# Patient Record
Sex: Female | Born: 1950 | Race: Black or African American | Hispanic: No | Marital: Married | Smoking: Former smoker
Health system: Southern US, Community
[De-identification: ages and names within clinical notes are randomized; demographics above are authoritative.]

## PROBLEM LIST (undated history)

## (undated) DIAGNOSIS — M1990 Unspecified osteoarthritis, unspecified site: Secondary | ICD-10-CM

## (undated) DIAGNOSIS — I509 Heart failure, unspecified: Secondary | ICD-10-CM

## (undated) DIAGNOSIS — Z9289 Personal history of other medical treatment: Secondary | ICD-10-CM

## (undated) DIAGNOSIS — G4730 Sleep apnea, unspecified: Secondary | ICD-10-CM

## (undated) DIAGNOSIS — I10 Essential (primary) hypertension: Secondary | ICD-10-CM

## (undated) DIAGNOSIS — E119 Type 2 diabetes mellitus without complications: Secondary | ICD-10-CM

## (undated) DIAGNOSIS — R911 Solitary pulmonary nodule: Secondary | ICD-10-CM

## (undated) DIAGNOSIS — G40409 Other generalized epilepsy and epileptic syndromes, not intractable, without status epilepticus: Secondary | ICD-10-CM

## (undated) DIAGNOSIS — F329 Major depressive disorder, single episode, unspecified: Secondary | ICD-10-CM

## (undated) DIAGNOSIS — R7303 Prediabetes: Secondary | ICD-10-CM

## (undated) DIAGNOSIS — E7800 Pure hypercholesterolemia, unspecified: Secondary | ICD-10-CM

## (undated) DIAGNOSIS — Z95810 Presence of automatic (implantable) cardiac defibrillator: Secondary | ICD-10-CM

## (undated) DIAGNOSIS — G8929 Other chronic pain: Secondary | ICD-10-CM

## (undated) DIAGNOSIS — F32A Depression, unspecified: Secondary | ICD-10-CM

## (undated) DIAGNOSIS — R519 Headache, unspecified: Secondary | ICD-10-CM

## (undated) DIAGNOSIS — J45909 Unspecified asthma, uncomplicated: Secondary | ICD-10-CM

## (undated) DIAGNOSIS — M109 Gout, unspecified: Secondary | ICD-10-CM

## (undated) DIAGNOSIS — F419 Anxiety disorder, unspecified: Secondary | ICD-10-CM

## (undated) DIAGNOSIS — J42 Unspecified chronic bronchitis: Secondary | ICD-10-CM

## (undated) DIAGNOSIS — M542 Cervicalgia: Secondary | ICD-10-CM

## (undated) DIAGNOSIS — U071 COVID-19: Secondary | ICD-10-CM

## (undated) DIAGNOSIS — E041 Nontoxic single thyroid nodule: Secondary | ICD-10-CM

## (undated) DIAGNOSIS — R011 Cardiac murmur, unspecified: Secondary | ICD-10-CM

## (undated) DIAGNOSIS — I639 Cerebral infarction, unspecified: Secondary | ICD-10-CM

## (undated) DIAGNOSIS — G459 Transient cerebral ischemic attack, unspecified: Secondary | ICD-10-CM

## (undated) HISTORY — PX: NEUROPLASTY / TRANSPOSITION MEDIAN NERVE AT CARPAL TUNNEL: SUR893

## (undated) HISTORY — PX: INNER EAR SURGERY: SHX679

## (undated) HISTORY — PX: RIGHT OOPHORECTOMY: SHX2359

## (undated) HISTORY — DX: Solitary pulmonary nodule: R91.1

## (undated) HISTORY — PX: TEAR DUCT PROBING: SHX793

## (undated) HISTORY — PX: BACK SURGERY: SHX140

## (undated) HISTORY — PX: LAPAROSCOPIC CHOLECYSTECTOMY: SUR755

---

## 1987-08-16 HISTORY — PX: VAGINAL HYSTERECTOMY: SUR661

## 2003-08-16 HISTORY — PX: POSTERIOR FUSION LUMBAR SPINE: SUR632

## 2007-08-16 ENCOUNTER — Emergency Department (HOSPITAL_COMMUNITY): Admission: EM | Admit: 2007-08-16 | Discharge: 2007-08-16 | Payer: Self-pay | Admitting: Emergency Medicine

## 2008-05-15 DIAGNOSIS — I639 Cerebral infarction, unspecified: Secondary | ICD-10-CM

## 2008-05-15 HISTORY — DX: Cerebral infarction, unspecified: I63.9

## 2009-07-17 ENCOUNTER — Encounter: Admission: RE | Admit: 2009-07-17 | Discharge: 2009-07-17 | Payer: Self-pay | Admitting: Family Medicine

## 2009-08-18 ENCOUNTER — Encounter: Admission: RE | Admit: 2009-08-18 | Discharge: 2009-09-22 | Payer: Self-pay | Admitting: Family Medicine

## 2009-09-24 ENCOUNTER — Encounter: Admission: RE | Admit: 2009-09-24 | Discharge: 2009-09-24 | Payer: Self-pay | Admitting: Orthopedic Surgery

## 2009-12-31 ENCOUNTER — Ambulatory Visit (HOSPITAL_BASED_OUTPATIENT_CLINIC_OR_DEPARTMENT_OTHER): Admission: RE | Admit: 2009-12-31 | Discharge: 2009-12-31 | Payer: Self-pay | Admitting: Orthopedic Surgery

## 2010-07-02 ENCOUNTER — Encounter: Admission: RE | Admit: 2010-07-02 | Discharge: 2010-07-02 | Payer: Self-pay | Admitting: Family Medicine

## 2010-11-01 LAB — DIFFERENTIAL
Basophils Absolute: 0 10*3/uL (ref 0.0–0.1)
Basophils Relative: 0 % (ref 0–1)
Lymphocytes Relative: 35 % (ref 12–46)
Monocytes Absolute: 0.3 10*3/uL (ref 0.1–1.0)
Monocytes Relative: 7 % (ref 3–12)
Neutro Abs: 2.8 10*3/uL (ref 1.7–7.7)
Neutrophils Relative %: 56 % (ref 43–77)

## 2010-11-01 LAB — CBC
HCT: 39.5 % (ref 36.0–46.0)
Hemoglobin: 13.3 g/dL (ref 12.0–15.0)
MCHC: 33.6 g/dL (ref 30.0–36.0)
MCV: 93.6 fL (ref 78.0–100.0)
Platelets: 301 10*3/uL (ref 150–400)
RDW: 13.5 % (ref 11.5–15.5)

## 2010-11-01 LAB — COMPREHENSIVE METABOLIC PANEL
Albumin: 4.3 g/dL (ref 3.5–5.2)
BUN: 11 mg/dL (ref 6–23)
Creatinine, Ser: 0.81 mg/dL (ref 0.4–1.2)
Glucose, Bld: 102 mg/dL — ABNORMAL HIGH (ref 70–99)
Total Protein: 7.3 g/dL (ref 6.0–8.3)

## 2012-05-07 ENCOUNTER — Emergency Department (HOSPITAL_COMMUNITY): Payer: BC Managed Care – PPO

## 2012-05-07 ENCOUNTER — Emergency Department (HOSPITAL_COMMUNITY)
Admission: EM | Admit: 2012-05-07 | Discharge: 2012-05-07 | Disposition: A | Discharge status: Home / Self Care | Payer: BC Managed Care – PPO | Attending: Emergency Medicine | Admitting: Emergency Medicine

## 2012-05-07 ENCOUNTER — Encounter (HOSPITAL_COMMUNITY): Payer: Self-pay | Admitting: Family Medicine

## 2012-05-07 DIAGNOSIS — Z8673 Personal history of transient ischemic attack (TIA), and cerebral infarction without residual deficits: Secondary | ICD-10-CM | POA: Insufficient documentation

## 2012-05-07 DIAGNOSIS — R040 Epistaxis: Secondary | ICD-10-CM | POA: Insufficient documentation

## 2012-05-07 DIAGNOSIS — R51 Headache: Secondary | ICD-10-CM | POA: Insufficient documentation

## 2012-05-07 DIAGNOSIS — R269 Unspecified abnormalities of gait and mobility: Secondary | ICD-10-CM | POA: Insufficient documentation

## 2012-05-07 DIAGNOSIS — R42 Dizziness and giddiness: Secondary | ICD-10-CM | POA: Insufficient documentation

## 2012-05-07 DIAGNOSIS — H53149 Visual discomfort, unspecified: Secondary | ICD-10-CM | POA: Insufficient documentation

## 2012-05-07 DIAGNOSIS — Z79899 Other long term (current) drug therapy: Secondary | ICD-10-CM | POA: Insufficient documentation

## 2012-05-07 HISTORY — DX: Transient cerebral ischemic attack, unspecified: G45.9

## 2012-05-07 HISTORY — DX: Cerebral infarction, unspecified: I63.9

## 2012-05-07 LAB — URINALYSIS, ROUTINE W REFLEX MICROSCOPIC
Bilirubin Urine: NEGATIVE
Glucose, UA: NEGATIVE mg/dL
Hgb urine dipstick: NEGATIVE
Ketones, ur: NEGATIVE mg/dL
Nitrite: NEGATIVE
Protein, ur: NEGATIVE mg/dL
Specific Gravity, Urine: 1.016 (ref 1.005–1.030)
Urobilinogen, UA: 1 mg/dL (ref 0.0–1.0)
pH: 6 (ref 5.0–8.0)

## 2012-05-07 LAB — CBC WITH DIFFERENTIAL/PLATELET
Basophils Absolute: 0 K/uL (ref 0.0–0.1)
Basophils Relative: 0 % (ref 0–1)
Eosinophils Absolute: 0.1 K/uL (ref 0.0–0.7)
Eosinophils Relative: 1 % (ref 0–5)
HCT: 41.5 % (ref 36.0–46.0)
Hemoglobin: 14.1 g/dL (ref 12.0–15.0)
Lymphocytes Relative: 40 % (ref 12–46)
Lymphs Abs: 2.4 K/uL (ref 0.7–4.0)
MCH: 32 pg (ref 26.0–34.0)
MCHC: 34 g/dL (ref 30.0–36.0)
MCV: 94.1 fL (ref 78.0–100.0)
Monocytes Absolute: 0.3 K/uL (ref 0.1–1.0)
Monocytes Relative: 6 % (ref 3–12)
Neutro Abs: 3.2 K/uL (ref 1.7–7.7)
Neutrophils Relative %: 54 % (ref 43–77)
Platelets: 290 K/uL (ref 150–400)
RBC: 4.41 MIL/uL (ref 3.87–5.11)
RDW: 13.3 % (ref 11.5–15.5)
WBC: 6 K/uL (ref 4.0–10.5)

## 2012-05-07 LAB — GLUCOSE, CAPILLARY: Glucose-Capillary: 84 mg/dL (ref 70–99)

## 2012-05-07 LAB — URINE MICROSCOPIC-ADD ON

## 2012-05-07 LAB — BASIC METABOLIC PANEL
BUN: 10 mg/dL (ref 6–23)
Chloride: 104 mEq/L (ref 96–112)
Creatinine, Ser: 0.73 mg/dL (ref 0.50–1.10)
Glucose, Bld: 98 mg/dL (ref 70–99)
Potassium: 4.2 mEq/L (ref 3.5–5.1)

## 2012-05-07 MED ORDER — METOCLOPRAMIDE HCL 5 MG/ML IJ SOLN
10.0000 mg | Freq: Once | INTRAMUSCULAR | Status: AC
  Administered 2012-05-07: 10 mg via INTRAVENOUS
  Filled 2012-05-07: qty 2

## 2012-05-07 MED ORDER — FENTANYL CITRATE 0.05 MG/ML IJ SOLN
25.0000 ug | Freq: Once | INTRAMUSCULAR | Status: AC
  Administered 2012-05-07: 25 ug via INTRAVENOUS
  Filled 2012-05-07: qty 2

## 2012-05-07 MED ORDER — MECLIZINE HCL 25 MG PO TABS
25.0000 mg | ORAL_TABLET | Freq: Once | ORAL | Status: AC
  Administered 2012-05-07: 25 mg via ORAL
  Filled 2012-05-07: qty 1

## 2012-05-07 MED ORDER — DEXAMETHASONE SODIUM PHOSPHATE 10 MG/ML IJ SOLN
10.0000 mg | Freq: Once | INTRAMUSCULAR | Status: AC
  Administered 2012-05-07: 10 mg via INTRAVENOUS
  Filled 2012-05-07: qty 1

## 2012-05-07 MED ORDER — DIPHENHYDRAMINE HCL 50 MG/ML IJ SOLN
12.5000 mg | Freq: Once | INTRAMUSCULAR | Status: AC
  Administered 2012-05-07: 12.5 mg via INTRAVENOUS
  Filled 2012-05-07: qty 1

## 2012-05-07 NOTE — ED Provider Notes (Signed)
History     CSN: [id number]  Arrival date & time 05/07/12  1159   First MD Initiated Contact with Patient 05/07/12 1508      Chief Complaint  Patient presents with  . Headache    (Consider location/radiation/quality/duration/timing/severity/associated sxs/prior treatment) HPI Comments: Margaret Vargas is a 61 y.o. Female who presents with complaint of intermittent headaches for last few weeks. Today states was working out at [employer] and was getting very dizzy. States felt lightheaded and felt like room was spinning every time she was doing something. States had presyncopal episode even while walking, but did not have LOC. States headaches have been intermittnet, mainly right sided and frontal. States hx of the same. States today's headache began when she was working out. States dizziness was associated with nausea. Denies fever, visual changes, vomiting, chest pain, SOB. States hx of TIAs. No cardiac hx. At present, states continues to have headache, however denies any current dizziness. Pt is having sensitivity to light.   The history is provided by the patient.    History reviewed. No pertinent past medical history.  History reviewed. No pertinent past surgical history.  History reviewed. No pertinent family history.  History  Substance Use Topics  . Smoking status: Never Smoker   . Smokeless tobacco: Not on file  . Alcohol Use: No    OB History    Grav Para Term Preterm Abortions TAB SAB Ect Mult Living                  Review of Systems  Constitutional: Negative for fever, chills and fatigue.  HENT: Negative for neck pain and neck stiffness.   Eyes: Positive for photophobia. Negative for pain and visual disturbance.  Respiratory: Negative for chest tightness and shortness of breath.   Cardiovascular: Negative for chest pain and leg swelling.  Gastrointestinal: Negative.   Genitourinary: Negative for dysuria, urgency and frequency.  Musculoskeletal: Positive for gait  problem.  Skin: Negative.   Neurological: Positive for dizziness, light-headedness and headaches. Negative for speech difficulty, weakness and numbness.  Psychiatric/Behavioral: Negative.     Allergies  Morphine and related  Home Medications   Current Outpatient Rx  Name Route Sig Dispense Refill  . ALLOPURINOL 100 MG PO TABS Oral Take 100 mg by mouth daily.    . ASPIRIN EC 81 MG PO TBEC Oral Take 81 mg by mouth daily.    [name] Kitchen CANDESARTAN CILEXETIL 32 MG PO TABS Oral Take 32 mg by mouth daily.    [name] Kitchen CETIRIZINE HCL 10 MG PO TABS Oral Take 10 mg by mouth daily.    [name] Kitchen VITAMIN D 1000 UNITS PO TABS Oral Take 1,000 Units by mouth daily.    [name] Kitchen VITAMIN B 12 PO Oral Take 1 tablet by mouth daily.    [name] Kitchen ESCITALOPRAM OXALATE 20 MG PO TABS Oral Take 20 mg by mouth daily.    [name] Kitchen HYDROCHLOROTHIAZIDE 25 MG PO TABS Oral Take 25 mg by mouth daily.    [name] Kitchen METFORMIN HCL 500 MG PO TABS Oral Take 500 mg by mouth daily.    [name] Kitchen SIMVASTATIN 20 MG PO TABS Oral Take 20 mg by mouth every evening.      BP 128/71  Pulse 58  Temp 98.1 F (36.7 C) (Oral)  Resp 17  SpO2 98%  Physical Exam  Nursing note and vitals reviewed. Constitutional: She is oriented to person, place, and time. She appears well-developed and well-nourished. No distress.  HENT:  Head: Normocephalic and atraumatic.  Right  Ear: External ear normal.  Left Ear: External ear normal.  Nose: Nose normal.  Mouth/Throat: Oropharynx is clear and moist.  Eyes: Conjunctivae normal and EOM are normal. Pupils are equal, round, and reactive to light.  Neck: Normal range of motion. Neck supple.  Cardiovascular: Normal rate, regular rhythm and normal heart sounds.   Pulmonary/Chest: Effort normal and breath sounds normal. No respiratory distress. She has no wheezes. She has no rales.  Abdominal: Soft. Bowel sounds are normal. She exhibits no distension. There is no tenderness. There is no rebound.  Musculoskeletal: She exhibits no edema.  Neurological: She is  alert and oriented to person, place, and time. No cranial nerve deficit. Coordination abnormal.       Slight pronator drift on the left side, left grip appears slightly weaker than right. There is also some weakness with left foot plantarflexion compared to the right. Normal sensation. Past pointing on the left side with finger to nose  Skin: Skin is warm and dry.    ED Course  Procedures (including critical care time)  Pt with left sided deficits on exam, presyncopal episodes today. Will get labs, ECG, CT head, monitor.   Results for orders placed during the hospital encounter of 05/07/12  CBC WITH DIFFERENTIAL      Component Value Range   WBC 6.0  4.0 - 10.5 K/uL   RBC 4.41  3.87 - 5.11 MIL/uL   Hemoglobin 14.1  12.0 - 15.0 g/dL   HCT 40.9  81.1 - 91.4 %   MCV 94.1  78.0 - 100.0 fL   MCH 32.0  26.0 - 34.0 pg   MCHC 34.0  30.0 - 36.0 g/dL   RDW 78.2  95.6 - 21.3 %   Platelets 290  150 - 400 K/uL   Neutrophils Relative 54  43 - 77 %   Neutro Abs 3.2  1.7 - 7.7 K/uL   Lymphocytes Relative 40  12 - 46 %   Lymphs Abs 2.4  0.7 - 4.0 K/uL   Monocytes Relative 6  3 - 12 %   Monocytes Absolute 0.3  0.1 - 1.0 K/uL   Eosinophils Relative 1  0 - 5 %   Eosinophils Absolute 0.1  0.0 - 0.7 K/uL   Basophils Relative 0  0 - 1 %   Basophils Absolute 0.0  0.0 - 0.1 K/uL  BASIC METABOLIC PANEL      Component Value Range   Sodium 142  135 - 145 mEq/L   Potassium 4.2  3.5 - 5.1 mEq/L   Chloride 104  96 - 112 mEq/L   CO2 27  19 - 32 mEq/L   Glucose, Bld 98  70 - 99 mg/dL   BUN 10  6 - 23 mg/dL   Creatinine, Ser 0.86  0.50 - 1.10 mg/dL   Calcium 57.8  8.4 - 46.9 mg/dL   GFR calc non Af Amer >90  >90 mL/min   GFR calc Af Amer >90  >90 mL/min  URINALYSIS, ROUTINE W REFLEX MICROSCOPIC      Component Value Range   Color, Urine YELLOW  YELLOW   APPearance HAZY (*) CLEAR   Specific Gravity, Urine 1.016  1.005 - 1.030   pH 6.0  5.0 - 8.0   Glucose, UA NEGATIVE  NEGATIVE mg/dL   Hgb urine  dipstick NEGATIVE  NEGATIVE   Bilirubin Urine NEGATIVE  NEGATIVE   Ketones, ur NEGATIVE  NEGATIVE mg/dL   Protein, ur NEGATIVE  NEGATIVE mg/dL   Urobilinogen,  UA 1.0  0.0 - 1.0 mg/dL   Nitrite NEGATIVE  NEGATIVE   Leukocytes, UA MODERATE (*) NEGATIVE  URINE MICROSCOPIC-ADD ON      Component Value Range   Squamous Epithelial / LPF FEW (*) RARE   WBC, UA 3-6  <3 WBC/hpf   Bacteria, UA RARE  RARE  GLUCOSE, CAPILLARY      Component Value Range   Glucose-Capillary 84  70 - 99 mg/dL   Comment 1 Documented in Chart     Comment 2 Notify RN     Ct Head Wo Contrast  05/07/2012  *RADIOLOGY REPORT*  Clinical Data: Headaches, epistaxis, dizziness, nausea, left side weakness for a week  CT HEAD WITHOUT CONTRAST  Technique:  Contiguous axial images were obtained from the base of the skull through the vertex without contrast.  Comparison: None  Findings: Numerous artifacts at skull base secondary to beam hardening. Normal ventricular morphology. No midline shift or mass effect. Single focus of high attenuation is identified at the right frontal region image 12 On thin section bone window images, this appears to represent volume averaging with minimal hyperostosis frontalis interna at the right frontal bone. No intracranial hemorrhage, mass lesion or evidence of acute infarction. Visualized paranasal sinuses and mastoid air cells clear. No acute calvarial abnormalities.  IMPRESSION: No acute intracranial abnormalities.   Original Report Authenticated By: Lollie Marrow, M.D.     Date: 05/07/2012  Rate: 64  Rhythm: normal sinus rhythm  QRS Axis: normal  Intervals: normal  ST/T Wave abnormalities: normal  Conduction Disutrbances:none  Narrative Interpretation: artifact noted  Old EKG Reviewed: unchanged    6:20 PM Pt's headache improving with decadron, benadryl, reglan. Her left sided weakness apparently is a residual from a stroke she had in 2006? Per husband which they did not mention earlier. She  has no new neurodeficits. She is no longer dizzy. She is ambulating without problems. She was hydrated in ER. She is requesting to go home. Discussed with Dr. Fonnie Jarvis who saw pt, agrees with plan to d/c home with close follow up with pcp.   No diagnosis found.    MDM  Pt with dizziness while working out at the gym earlier today. No new neurodeficits. Dizziness resolved. She is not having CP, SOB. Headache is improving. Hx of similar headaches, this one gradual in onset, unilateral. VS normal. Pt in NAD, non toxic. No concern for ACS or an acute stroke at this time. Will d/c home with close follow  Up with pcp         Lottie Mussel, PA 05/07/12 1824  Lottie Mussel, PA 05/08/12 0110

## 2012-05-07 NOTE — ED Notes (Signed)
Per pt she has had headaches for weeks. sts she also had a nosebleed on MOnday. sts she was at the gym today and became dizzy and felt like the room was spinning, sts also severe HA and nausea. Pt sts she has noticed her left side was weaker for about a week now. No other deficits noted. Pt tearful

## 2012-05-07 NOTE — ED Provider Notes (Signed)
Medical screening examination/treatment/procedure(s) were conducted as a shared visit with non-physician practitioner(s) and myself.  I personally evaluated the patient during the encounter  The patient and her husband state patient has had some very mild left facial droop and left arm and leg weakness since a prior stroke, today she had an episode of presyncope with lightheadedness with nausea without vertigo and without any change in speech vision swallowing or understanding and no new localized or lateralizing weakness or numbness or incoordination. She is gradual onset headache today that was not sudden onset. She's had similar gradual onset headaches off-and-on gradually lasting several hours at a time for the last couple weeks. Today's presyncope was not associated with vertigo or incoordination or any new focal neurologic symptoms. On examination her pupils reactive, extraocular movements intact, no nystagmus, peripheral visual fields intact to confrontation, slight facial asymmetry with slight left lower facial weakness which has been states apparently is baseline for patient, she also is very slight left hemiparesis and arm and leg which is also baseline for the patient with minimal left pronator drift with normal light touch in both arms and both legs normal finger to nose testing of the right arm and minimal difficulty with her left arm which apparently is baseline for her as well.  Hurman Horn, MD 05/08/12 [phone]

## 2012-05-07 NOTE — ED Notes (Signed)
MD at bedside. 

## 2012-05-09 LAB — POCT I-STAT TROPONIN I

## 2013-12-31 ENCOUNTER — Other Ambulatory Visit: Payer: Self-pay

## 2013-12-31 DIAGNOSIS — Z1231 Encounter for screening mammogram for malignant neoplasm of breast: Secondary | ICD-10-CM

## 2014-01-10 ENCOUNTER — Ambulatory Visit (INDEPENDENT_AMBULATORY_CARE_PROVIDER_SITE_OTHER): Payer: BC Managed Care – PPO

## 2014-01-10 VITALS — Ht 69.5 in | Wt 300.0 lb

## 2014-01-10 DIAGNOSIS — M722 Plantar fascial fibromatosis: Secondary | ICD-10-CM

## 2014-01-10 DIAGNOSIS — M7730 Calcaneal spur, unspecified foot: Secondary | ICD-10-CM

## 2014-01-10 DIAGNOSIS — M79609 Pain in unspecified limb: Secondary | ICD-10-CM

## 2014-01-10 MED ORDER — MELOXICAM 15 MG PO TABS: 15.0000 mg | ORAL_TABLET | Freq: Every day | ORAL | Status: DC

## 2014-01-10 NOTE — Patient Instructions (Signed)

## 2014-01-10 NOTE — Progress Notes (Signed)
   Subjective:    Patient ID: Margaret Vargas, female    DOB: 1950/10/29, 63 y.o.   MRN: [id number]  HPI Comments: N heel pain L left heel  D 2 weeks O sudden C dull or pressure pain A worse after rest and long periods of activity T ice massage, Advil     Review of Systems  HENT: Positive for hearing loss and sinus pressure.   Musculoskeletal: Positive for back pain.  Allergic/Immunologic: Positive for environmental allergies.  Neurological: Positive for seizures.       Seizures - 2004  All other systems reviewed and are negative.      Objective:   Physical Exam Will change at the findings as follows vascular status is intact pedal pulses palpable DP and PT posterior were for capillary fill time 3 seconds epicritic and proprioceptive sensations intact and symmetric bilateral there is normal plantar response DTRs not listed dermatologically skin color pigment normal hair growth diminished absent nails somewhat criptotic otherwise unremarkable there is pain on palpation mid band of the plantar fascia from medial arch only to the inferior heel area on the left there is some tenderness in the right heel as well although not as severe intense than left has been severely painful for about 2 weeks now especially on first up in the morning or getting up after a period of rest her activities in inactivity. Orthopedic exam unremarkable x-rays reveal well-developed inferior and retrocalcaneal spurring no signs of fracture or other osseous abnormalities rectus foot type otherwise identified.       Assessment & Plan:  Assessment plantar fasciitis/heel spur syndrome left foot plan at this time fascial strapping applied there is some concern about adhesive allergy patient is allergic to the plastic Band-Aids I had problems with cloth and paper tape however we'll discontinue taping or take it off there's any skin irritation from the fascial strapping. Maintain strapping for 5 days as instructed also  this time prescription for MOBIC 15 mg once daily is issued recommended ice to the heel every evening and recheck in 2-3 weeks for followup and reevaluation may be candidate for orthoses in the future based on progress  Alvan Dame DPM

## 2014-01-14 ENCOUNTER — Ambulatory Visit
Admission: RE | Admit: 2014-01-14 | Discharge: 2014-01-14 | Disposition: A | Discharge status: Home / Self Care | Payer: BC Managed Care – PPO | Source: Ambulatory Visit

## 2014-01-14 DIAGNOSIS — Z1231 Encounter for screening mammogram for malignant neoplasm of breast: Secondary | ICD-10-CM

## 2014-01-24 ENCOUNTER — Ambulatory Visit: Payer: BC Managed Care – PPO

## 2014-05-05 ENCOUNTER — Other Ambulatory Visit: Payer: Self-pay | Admitting: Neurology

## 2014-05-05 DIAGNOSIS — G4489 Other headache syndrome: Secondary | ICD-10-CM

## 2014-07-21 ENCOUNTER — Other Ambulatory Visit: Payer: Self-pay | Admitting: Family Medicine

## 2014-07-21 ENCOUNTER — Ambulatory Visit
Admission: RE | Admit: 2014-07-21 | Discharge: 2014-07-21 | Disposition: A | Discharge status: Home / Self Care | Payer: BC Managed Care – PPO | Source: Ambulatory Visit | Attending: Family Medicine | Admitting: Family Medicine

## 2014-07-21 DIAGNOSIS — M542 Cervicalgia: Secondary | ICD-10-CM

## 2014-07-24 ENCOUNTER — Other Ambulatory Visit: Payer: Self-pay | Admitting: Family Medicine

## 2014-07-24 DIAGNOSIS — K119 Disease of salivary gland, unspecified: Secondary | ICD-10-CM

## 2014-07-24 DIAGNOSIS — R42 Dizziness and giddiness: Secondary | ICD-10-CM

## 2014-07-24 DIAGNOSIS — K118 Other diseases of salivary glands: Secondary | ICD-10-CM

## 2014-07-29 ENCOUNTER — Other Ambulatory Visit: Payer: BC Managed Care – PPO

## 2014-07-31 ENCOUNTER — Ambulatory Visit
Admission: RE | Admit: 2014-07-31 | Discharge: 2014-07-31 | Disposition: A | Discharge status: Home / Self Care | Payer: BC Managed Care – PPO | Source: Ambulatory Visit | Attending: Family Medicine | Admitting: Family Medicine

## 2014-07-31 DIAGNOSIS — R42 Dizziness and giddiness: Secondary | ICD-10-CM

## 2014-08-04 ENCOUNTER — Ambulatory Visit
Admission: RE | Admit: 2014-08-04 | Discharge: 2014-08-04 | Disposition: A | Discharge status: Home / Self Care | Payer: BC Managed Care – PPO | Source: Ambulatory Visit | Attending: Family Medicine | Admitting: Family Medicine

## 2014-08-04 ENCOUNTER — Other Ambulatory Visit: Payer: BC Managed Care – PPO

## 2014-08-04 DIAGNOSIS — R42 Dizziness and giddiness: Secondary | ICD-10-CM

## 2014-08-04 DIAGNOSIS — K118 Other diseases of salivary glands: Secondary | ICD-10-CM

## 2014-08-04 DIAGNOSIS — K119 Disease of salivary gland, unspecified: Secondary | ICD-10-CM

## 2014-08-05 ENCOUNTER — Other Ambulatory Visit: Payer: Self-pay | Admitting: Family Medicine

## 2014-08-05 DIAGNOSIS — E041 Nontoxic single thyroid nodule: Secondary | ICD-10-CM

## 2014-08-06 ENCOUNTER — Other Ambulatory Visit (HOSPITAL_COMMUNITY): Payer: Self-pay | Admitting: Family Medicine

## 2014-08-06 ENCOUNTER — Other Ambulatory Visit: Payer: Self-pay | Admitting: Family Medicine

## 2014-08-06 DIAGNOSIS — E041 Nontoxic single thyroid nodule: Secondary | ICD-10-CM

## 2014-08-06 DIAGNOSIS — E042 Nontoxic multinodular goiter: Secondary | ICD-10-CM

## 2014-08-13 ENCOUNTER — Ambulatory Visit (HOSPITAL_COMMUNITY)
Admission: RE | Admit: 2014-08-13 | Discharge: 2014-08-13 | Disposition: A | Discharge status: Home / Self Care | Payer: BC Managed Care – PPO | Source: Ambulatory Visit | Attending: Family Medicine | Admitting: Family Medicine

## 2014-08-13 DIAGNOSIS — E041 Nontoxic single thyroid nodule: Secondary | ICD-10-CM | POA: Insufficient documentation

## 2014-08-13 MED ORDER — LIDOCAINE HCL (PF) 1 % IJ SOLN
INTRAMUSCULAR | Status: AC
  Filled 2014-08-13: qty 10

## 2014-08-13 NOTE — Procedures (Signed)
Successful [place] left thyroid nodule FNA No complications.  Brayton El PA-C Interventional Radiology 08/13/2014 2:46 PM

## 2014-08-26 ENCOUNTER — Other Ambulatory Visit: Payer: BC Managed Care – PPO

## 2014-08-27 ENCOUNTER — Other Ambulatory Visit: Payer: Self-pay | Admitting: Family Medicine

## 2014-08-27 ENCOUNTER — Ambulatory Visit
Admission: RE | Admit: 2014-08-27 | Discharge: 2014-08-27 | Disposition: A | Discharge status: Home / Self Care | Payer: BLUE CROSS/BLUE SHIELD | Source: Ambulatory Visit | Attending: Family Medicine | Admitting: Family Medicine

## 2014-08-27 DIAGNOSIS — J45909 Unspecified asthma, uncomplicated: Secondary | ICD-10-CM

## 2014-12-18 ENCOUNTER — Other Ambulatory Visit: Payer: Self-pay | Admitting: Surgery

## 2014-12-18 DIAGNOSIS — E041 Nontoxic single thyroid nodule: Secondary | ICD-10-CM

## 2014-12-23 ENCOUNTER — Other Ambulatory Visit: Payer: Self-pay | Admitting: Surgery

## 2014-12-23 DIAGNOSIS — E041 Nontoxic single thyroid nodule: Secondary | ICD-10-CM

## 2015-02-04 ENCOUNTER — Ambulatory Visit
Admission: RE | Admit: 2015-02-04 | Discharge: 2015-02-04 | Disposition: A | Discharge status: Home / Self Care | Payer: BLUE CROSS/BLUE SHIELD | Source: Ambulatory Visit | Attending: Surgery | Admitting: Surgery

## 2015-02-04 DIAGNOSIS — E041 Nontoxic single thyroid nodule: Secondary | ICD-10-CM

## 2015-06-07 IMAGING — US US BIOPSY
1 series · 8 of 8 positions shown · non-contrast
Comparison: Ultrasound soft tissue neck from 08/04/2014.

MEDICATIONS:
None

COMPLICATIONS:
None

INDICATION: Indeterminate left thyroid nodule

EXAM:
ULTRASOUND GUIDED FINE NEEDLE ASPIRATION OF INDETERMINATE LEFT
THYROID NODULE
TECHNIQUE: Informed written consent was obtained from the patient after a
discussion of the risks, benefits and alternatives to treatment.
Questions regarding the procedure were encouraged and answered. A
timeout was performed prior to the initiation of the procedure.

[Series 1: us biopsy · 0.07mm/px · 8 acquisitions, 8 frames shown]
[im 1/8]
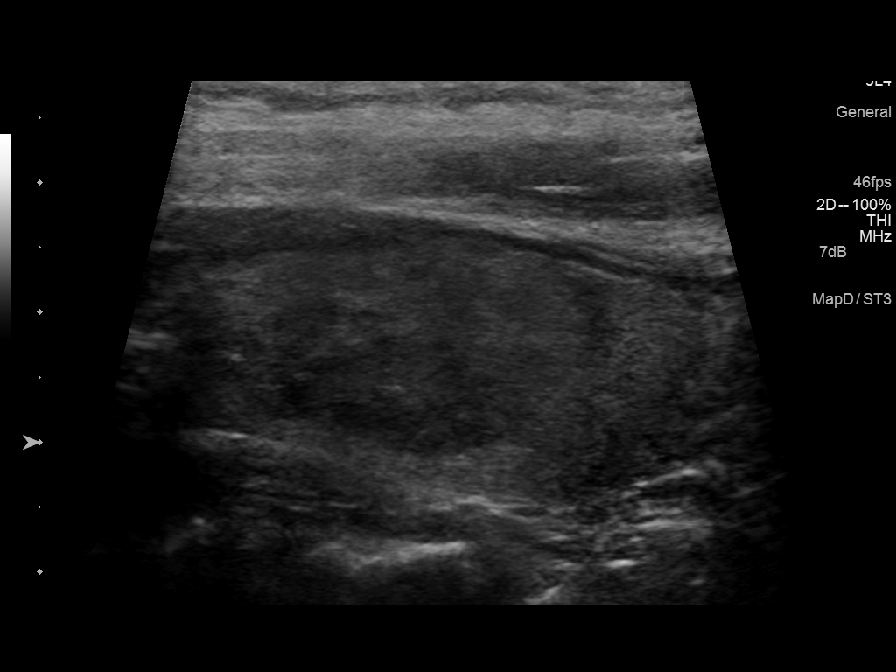
[im 2/8]
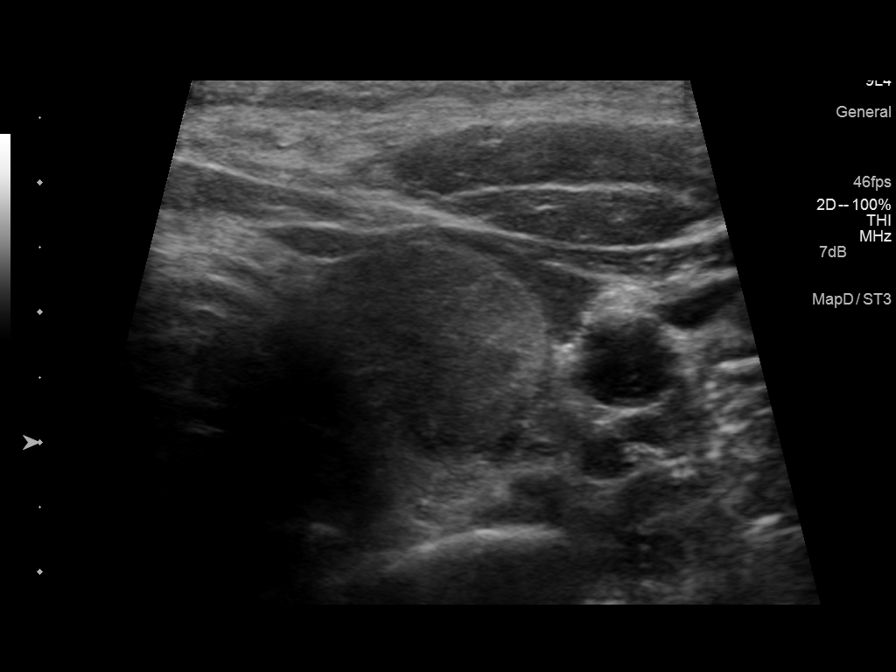
[im 3/8]
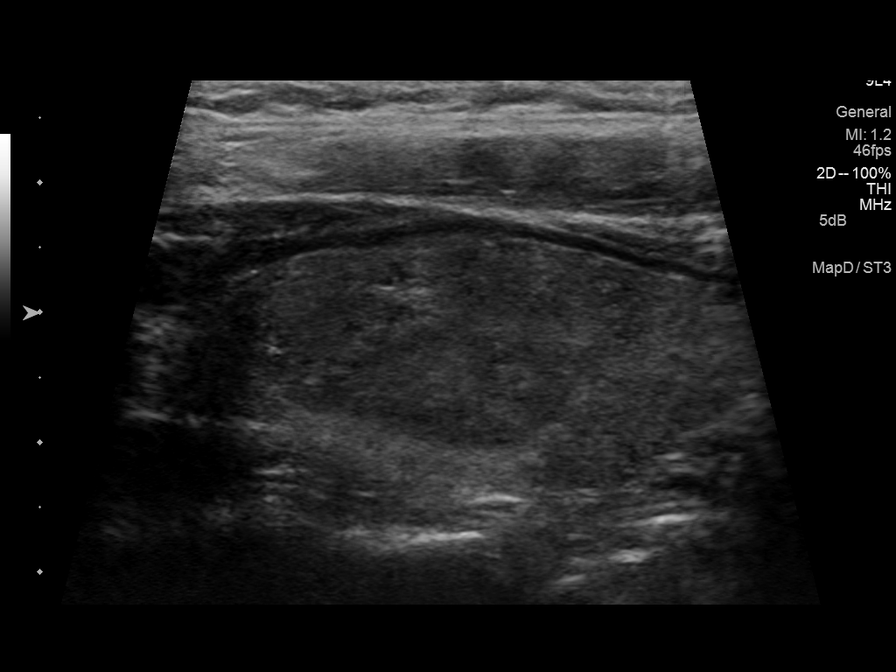
[im 4/8]
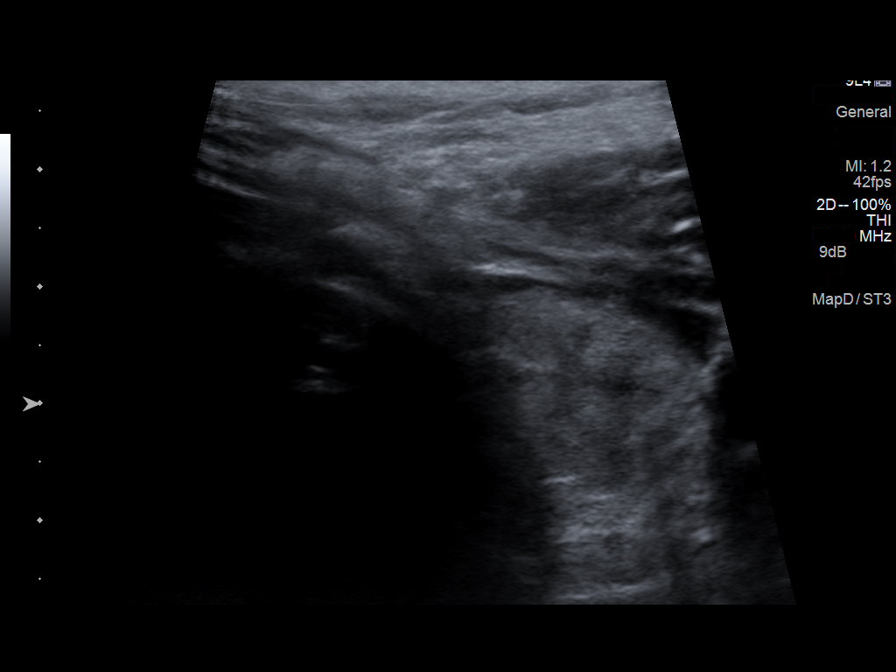
[im 5/8]
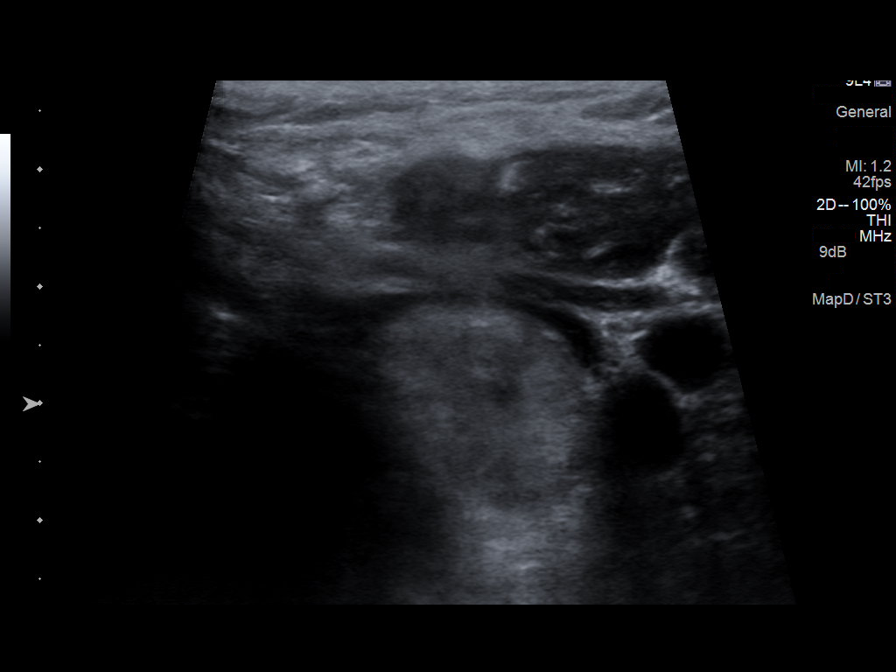
[im 6/8]
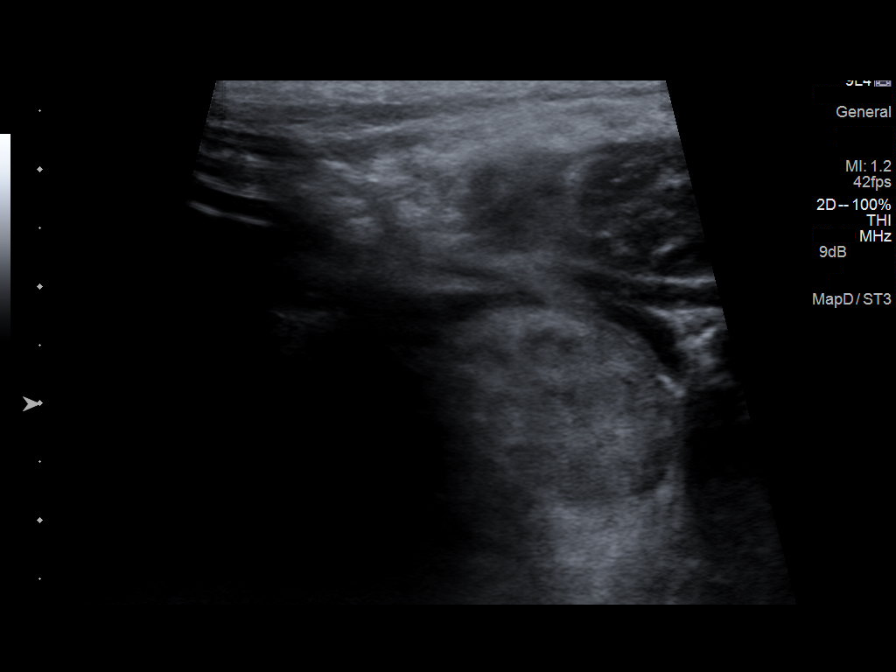
[im 7/8]
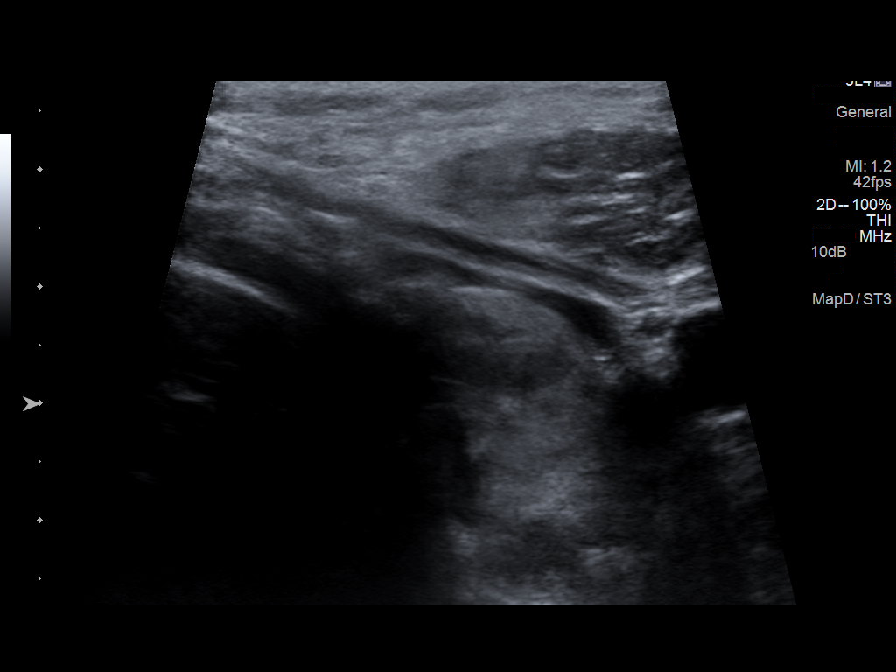
[im 8/8]
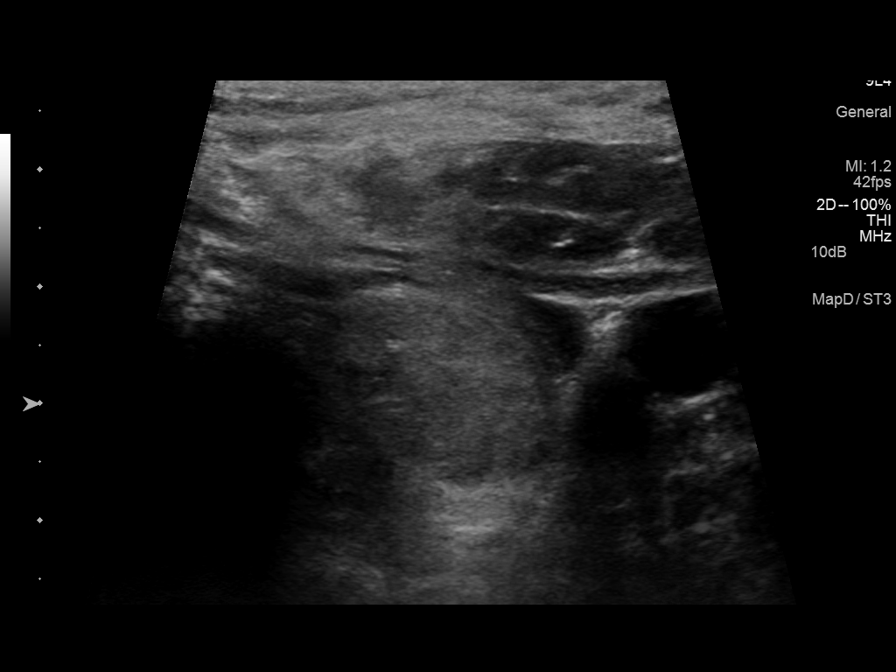

[8 of 8 positions shown; findings below may reference images not displayed]

Pre-procedural ultrasound scanning demonstrated dominant nodule of
the superior left thyroid lobe measuring approximately 3.5 x 1.4 x
2.3 cm. Solid, hypoechoic in appearance

The procedure was planned. The neck was prepped in the usual sterile
fashion, and a sterile drape was applied covering the operative
field. A timeout was performed prior to the initiation of the
procedure. Local anesthesia was provided with 1% lidocaine.

Under direct ultrasound guidance, 4 FNA biopsies were performed of
the left thyroid nodule with a 25 gauge needle. The samples were
prepared and submitted to pathology.

Limited post procedural scanning was negative for hematoma or
additional complication. Dressings were placed. The patient
tolerated the above procedures procedure well without immediate
postprocedural complication.
IMPRESSION: Technically successful ultrasound guided fine needle aspiration of
left thyroid nodule

## 2015-06-21 IMAGING — CR DG CHEST 2V
2 series · 2 of 2 positions shown · non-contrast
Comparison: None.

CLINICAL DATA: Cough shortness of breath and wheezing for the past
week with occasional fever ; history of diabetes and asthma

EXAM:
CHEST  2 VIEW

[w chest pa *]
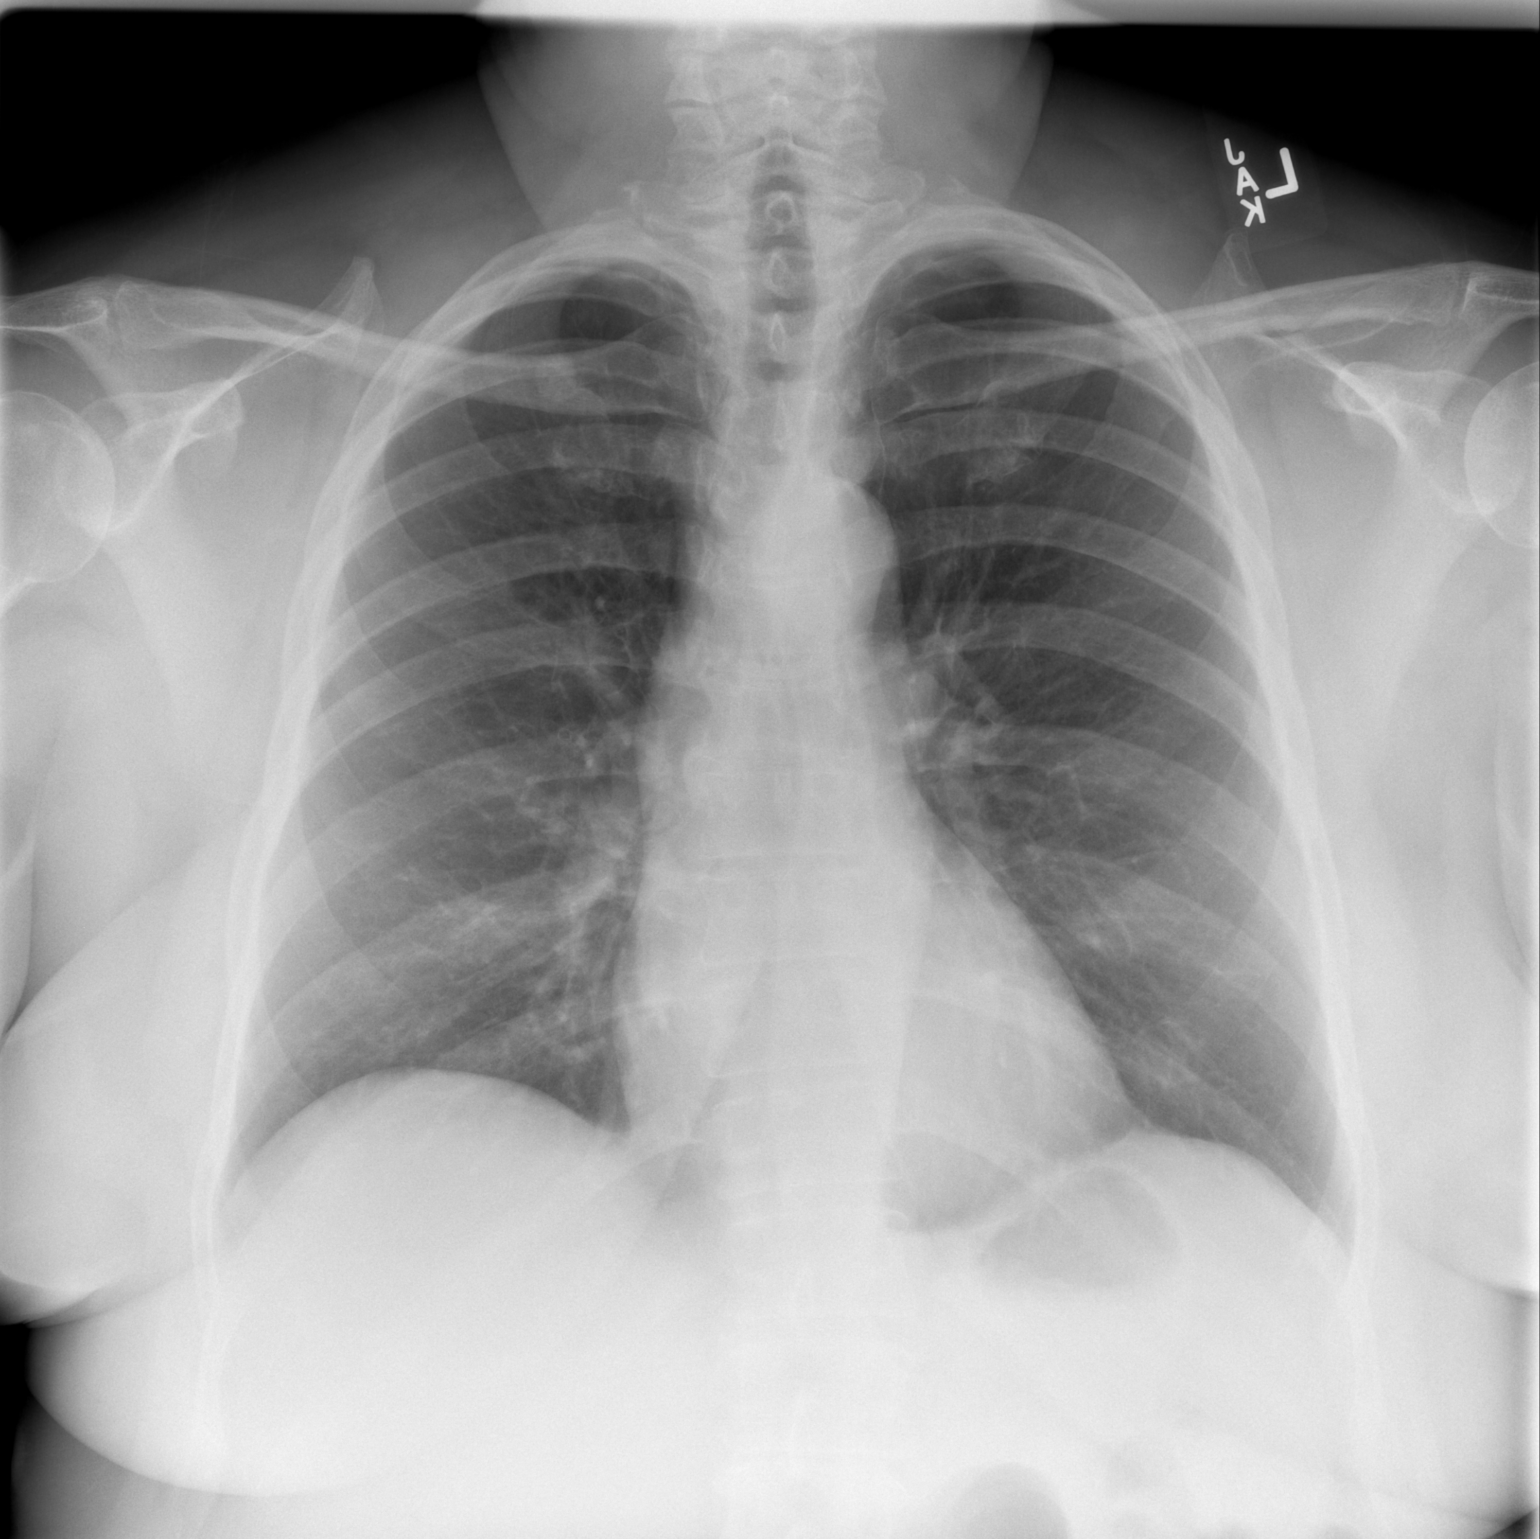

[w chest lat]
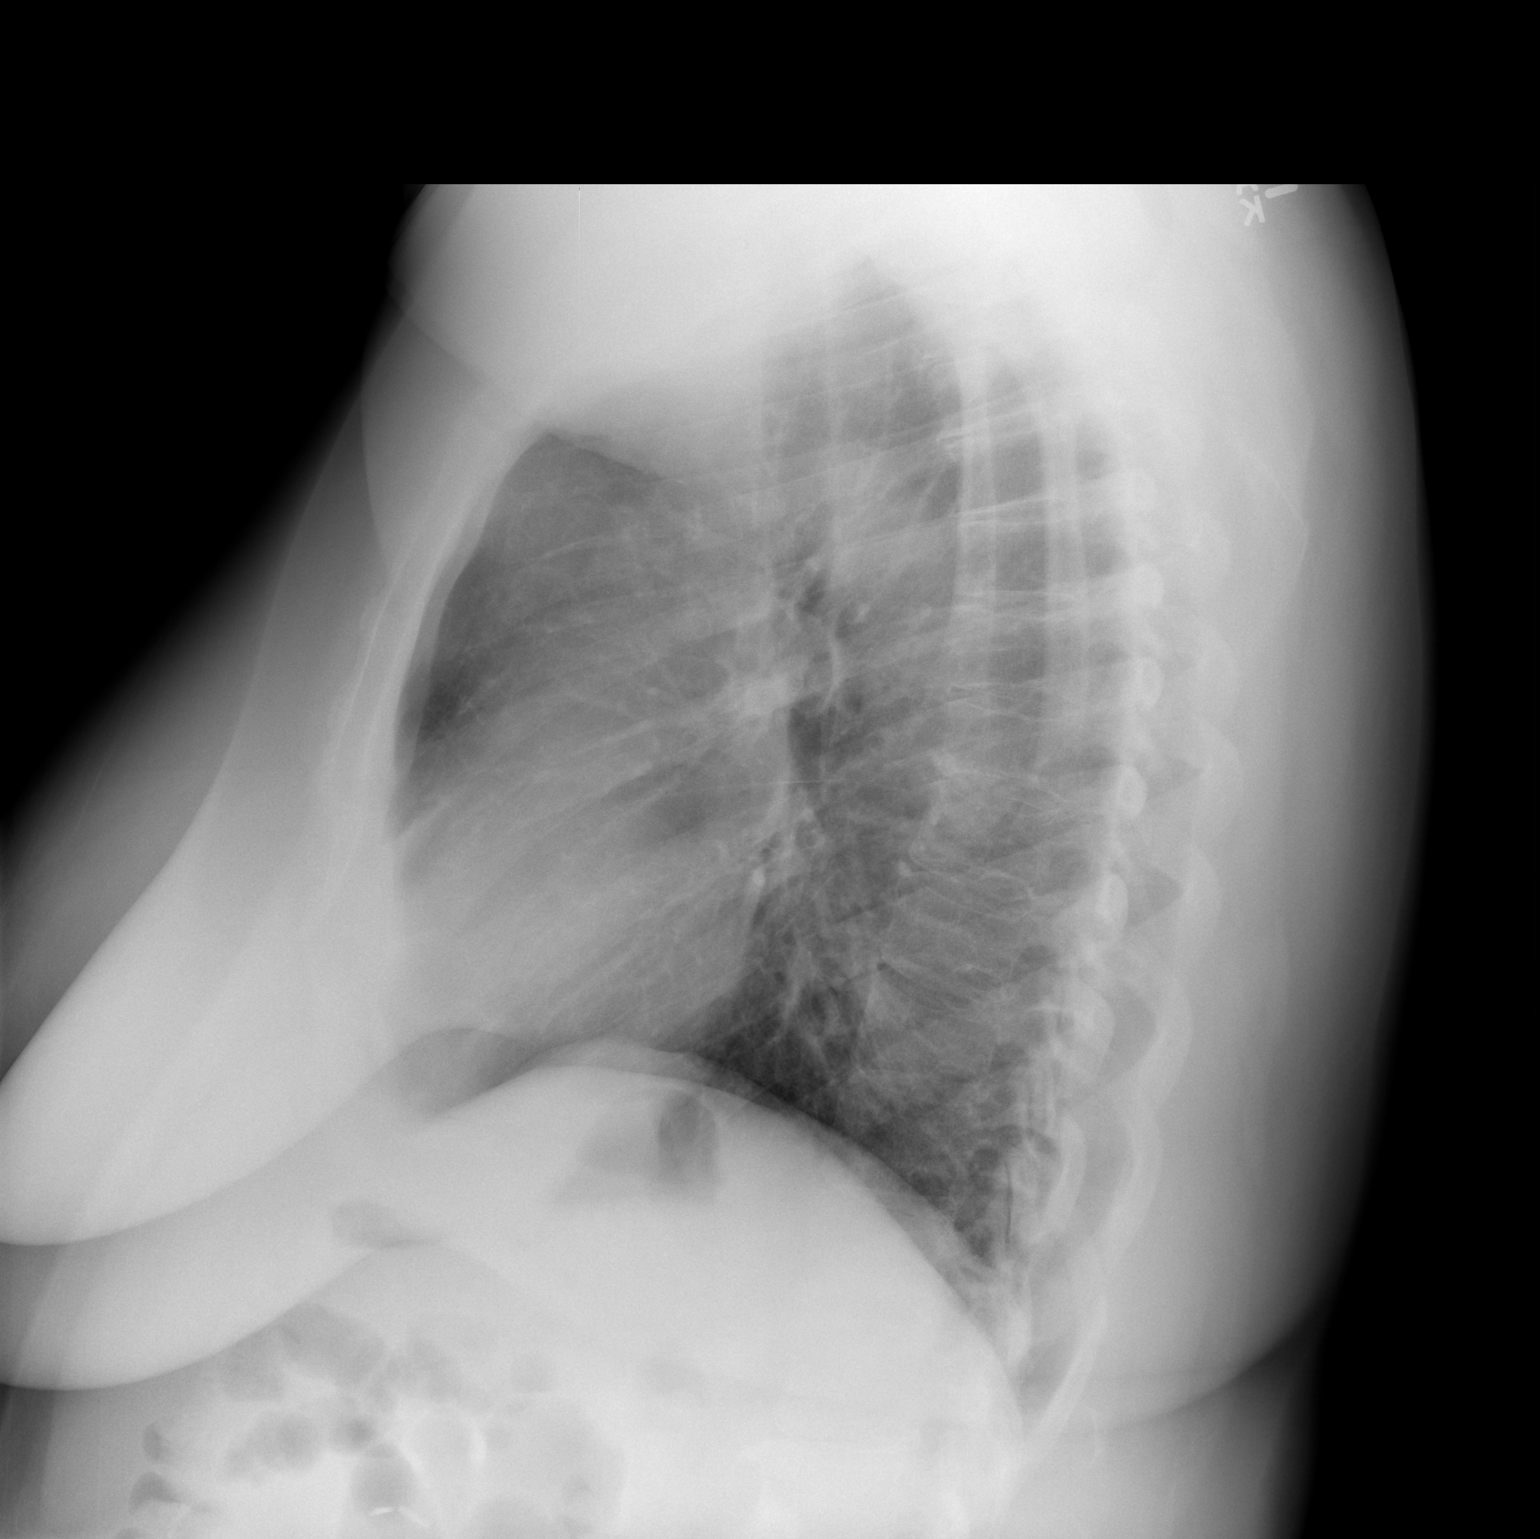

[2 of 2 positions shown; findings below may reference images not displayed]

FINDINGS: The lungs are adequately inflated. There is no focal infiltrate.
There is no pleural effusion or pneumothorax. The heart and
pulmonary vascularity are normal. The trachea is midline. The bony
thorax is unremarkable.
IMPRESSION: There is no active cardiopulmonary disease.

## 2016-02-10 ENCOUNTER — Other Ambulatory Visit: Payer: Self-pay | Admitting: Surgery

## 2016-02-10 DIAGNOSIS — E041 Nontoxic single thyroid nodule: Secondary | ICD-10-CM

## 2016-02-22 ENCOUNTER — Ambulatory Visit
Admission: RE | Admit: 2016-02-22 | Discharge: 2016-02-22 | Disposition: A | Discharge status: Home / Self Care | Payer: BLUE CROSS/BLUE SHIELD | Source: Ambulatory Visit | Attending: Surgery | Admitting: Surgery

## 2016-02-22 DIAGNOSIS — E041 Nontoxic single thyroid nodule: Secondary | ICD-10-CM

## 2016-10-07 ENCOUNTER — Ambulatory Visit
Admission: RE | Admit: 2016-10-07 | Discharge: 2016-10-07 | Disposition: A | Discharge status: Home / Self Care | Payer: BLUE CROSS/BLUE SHIELD | Source: Ambulatory Visit | Attending: Family Medicine | Admitting: Family Medicine

## 2016-10-07 ENCOUNTER — Other Ambulatory Visit: Payer: Self-pay | Admitting: Family Medicine

## 2016-10-07 DIAGNOSIS — R059 Cough, unspecified: Secondary | ICD-10-CM

## 2016-10-07 DIAGNOSIS — R05 Cough: Secondary | ICD-10-CM

## 2016-11-12 ENCOUNTER — Emergency Department (HOSPITAL_COMMUNITY)
Admission: EM | Admit: 2016-11-12 | Discharge: 2016-11-12 | Disposition: A | Discharge status: Home / Self Care | Payer: BLUE CROSS/BLUE SHIELD | Attending: Physician Assistant | Admitting: Physician Assistant

## 2016-11-12 ENCOUNTER — Emergency Department (HOSPITAL_COMMUNITY): Payer: BLUE CROSS/BLUE SHIELD

## 2016-11-12 ENCOUNTER — Encounter (HOSPITAL_COMMUNITY): Payer: Self-pay | Admitting: Emergency Medicine

## 2016-11-12 DIAGNOSIS — I5043 Acute on chronic combined systolic (congestive) and diastolic (congestive) heart failure: Secondary | ICD-10-CM | POA: Diagnosis not present

## 2016-11-12 DIAGNOSIS — A09 Infectious gastroenteritis and colitis, unspecified: Secondary | ICD-10-CM | POA: Diagnosis not present

## 2016-11-12 DIAGNOSIS — Z7982 Long term (current) use of aspirin: Secondary | ICD-10-CM | POA: Insufficient documentation

## 2016-11-12 DIAGNOSIS — Z8673 Personal history of transient ischemic attack (TIA), and cerebral infarction without residual deficits: Secondary | ICD-10-CM | POA: Diagnosis not present

## 2016-11-12 DIAGNOSIS — Z87891 Personal history of nicotine dependence: Secondary | ICD-10-CM | POA: Insufficient documentation

## 2016-11-12 DIAGNOSIS — I110 Hypertensive heart disease with heart failure: Secondary | ICD-10-CM | POA: Insufficient documentation

## 2016-11-12 DIAGNOSIS — J45909 Unspecified asthma, uncomplicated: Secondary | ICD-10-CM | POA: Diagnosis not present

## 2016-11-12 DIAGNOSIS — R109 Unspecified abdominal pain: Secondary | ICD-10-CM | POA: Diagnosis present

## 2016-11-12 DIAGNOSIS — R197 Diarrhea, unspecified: Secondary | ICD-10-CM

## 2016-11-12 LAB — COMPREHENSIVE METABOLIC PANEL
ALK PHOS: 33 U/L — AB (ref 38–126)
ALT: 31 U/L (ref 14–54)
ANION GAP: 9 (ref 5–15)
AST: 28 U/L (ref 15–41)
Albumin: 4 g/dL (ref 3.5–5.0)
BILIRUBIN TOTAL: 0.8 mg/dL (ref 0.3–1.2)
BUN: 14 mg/dL (ref 6–20)
CALCIUM: 9 mg/dL (ref 8.9–10.3)
CO2: 26 mmol/L (ref 22–32)
Chloride: 106 mmol/L (ref 101–111)
Creatinine, Ser: 0.9 mg/dL (ref 0.44–1.00)
GFR calc Af Amer: >60 mL/min (ref >60)
Glucose, Bld: 133 mg/dL — ABNORMAL HIGH (ref 65–99)
POTASSIUM: 4.2 mmol/L (ref 3.5–5.1)
Sodium: 141 mmol/L (ref 135–145)
TOTAL PROTEIN: 6.5 g/dL (ref 6.5–8.1)

## 2016-11-12 LAB — CBC WITH DIFFERENTIAL/PLATELET
Basophils Absolute: 0 10*3/uL (ref 0.0–0.1)
Basophils Relative: 0 %
Eosinophils Absolute: 0.1 10*3/uL (ref 0.0–0.7)
Eosinophils Relative: 1 %
HEMATOCRIT: 38.5 % (ref 36.0–46.0)
HEMOGLOBIN: 12 g/dL (ref 12.0–15.0)
LYMPHS ABS: 2.5 10*3/uL (ref 0.7–4.0)
LYMPHS PCT: 26 %
MCH: 30.5 pg (ref 26.0–34.0)
MCHC: 31.2 g/dL (ref 30.0–36.0)
MCV: 97.7 fL (ref 78.0–100.0)
MONOS PCT: 6 %
Monocytes Absolute: 0.6 10*3/uL (ref 0.1–1.0)
NEUTROS PCT: 67 %
Neutro Abs: 6.4 10*3/uL (ref 1.7–7.7)
PLATELETS: 291 10*3/uL (ref 150–400)
RBC: 3.94 MIL/uL (ref 3.87–5.11)
RDW: 15.6 % — ABNORMAL HIGH (ref 11.5–15.5)
WBC: 9.6 10*3/uL (ref 4.0–10.5)

## 2016-11-12 LAB — URINALYSIS, ROUTINE W REFLEX MICROSCOPIC
Bacteria, UA: NONE SEEN
Bilirubin Urine: NEGATIVE
Glucose, UA: NEGATIVE mg/dL
Hgb urine dipstick: NEGATIVE
Ketones, ur: NEGATIVE mg/dL
Nitrite: NEGATIVE
PH: 5 (ref 5.0–8.0)
Protein, ur: 30 mg/dL — AB
SPECIFIC GRAVITY, URINE: 1.024 (ref 1.005–1.030)

## 2016-11-12 LAB — LIPASE, BLOOD: Lipase: 63 U/L — ABNORMAL HIGH (ref 11–51)

## 2016-11-12 LAB — C DIFFICILE QUICK SCREEN W PCR REFLEX
C DIFFICILE (CDIFF) INTERP: NOT DETECTED
C DIFFICLE (CDIFF) ANTIGEN: NEGATIVE
C Diff toxin: NEGATIVE

## 2016-11-12 MED ORDER — RANITIDINE HCL 150 MG/10ML PO SYRP
300.0000 mg | ORAL_SOLUTION | Freq: Once | ORAL | Status: AC
  Administered 2016-11-12: 300 mg via ORAL
  Filled 2016-11-12: qty 20

## 2016-11-12 MED ORDER — METOCLOPRAMIDE HCL 10 MG PO TABS
10.0000 mg | ORAL_TABLET | Freq: Once | ORAL | Status: AC
  Administered 2016-11-12: 10 mg via ORAL
  Filled 2016-11-12: qty 1

## 2016-11-12 MED ORDER — ONDANSETRON 4 MG PO TBDP
4.0000 mg | ORAL_TABLET | Freq: Once | ORAL | Status: AC
  Administered 2016-11-12: 4 mg via ORAL
  Filled 2016-11-12: qty 1

## 2016-11-12 MED ORDER — IOPAMIDOL (ISOVUE-300) INJECTION 61%
INTRAVENOUS | Status: AC
  Administered 2016-11-12: 100 mL
  Filled 2016-11-12: qty 100

## 2016-11-12 MED ORDER — ONDANSETRON 4 MG PO TBDP
4.0000 mg | ORAL_TABLET | Freq: Once | ORAL | Status: AC | PRN
  Administered 2016-11-12: 4 mg via ORAL
  Filled 2016-11-12: qty 1

## 2016-11-12 MED ORDER — SUCRALFATE 1 GM/10ML PO SUSP
1.0000 g | Freq: Once | ORAL | Status: AC
  Administered 2016-11-12: 1 g via ORAL
  Filled 2016-11-12: qty 10

## 2016-11-12 NOTE — ED Triage Notes (Signed)
Pt reports abd pains and diarrhea for 3 days. Pt reports history of C-Diff but not been on any antibiotics in a month. Pt reports nausea but no vomiting.

## 2016-11-12 NOTE — ED Notes (Signed)
Patient transported to CT 

## 2016-11-12 NOTE — ED Notes (Signed)
Patient returned from CT placed on monitor.

## 2016-11-12 NOTE — Discharge Instructions (Signed)
Please return if you're unable to stay hydrated, have dark tarry stools, or any other concerns.

## 2016-11-12 NOTE — ED Notes (Signed)
Patient still in CT.

## 2016-11-13 LAB — URINE CULTURE

## 2016-11-13 NOTE — ED Provider Notes (Signed)
Taken over patient by Dr. Mora Bellman.  Pt awaiting CT imaging.    CT shows cystic structures in L adnexa consisten with known ovarian cysts.  Pt tolerating PO, normal vitals and labs.  Will have her follow up with PCP for diarrhea if not better in several days, will have her bring sample to physician.    Return precuations expressed to daughter and husband in the room.    Margaret Michalowski Randall An, MD 11/13/16 [phone]

## 2016-11-16 ENCOUNTER — Inpatient Hospital Stay (HOSPITAL_COMMUNITY)
Admission: EM | Admit: 2016-11-16 | Discharge: 2016-11-23 | DRG: 292 | Disposition: A | Discharge status: Home / Self Care | Payer: BLUE CROSS/BLUE SHIELD | Attending: Internal Medicine | Admitting: Internal Medicine

## 2016-11-16 ENCOUNTER — Emergency Department (HOSPITAL_COMMUNITY): Payer: BLUE CROSS/BLUE SHIELD

## 2016-11-16 ENCOUNTER — Encounter (HOSPITAL_COMMUNITY): Payer: Self-pay

## 2016-11-16 DIAGNOSIS — Z7982 Long term (current) use of aspirin: Secondary | ICD-10-CM | POA: Diagnosis not present

## 2016-11-16 DIAGNOSIS — M7989 Other specified soft tissue disorders: Secondary | ICD-10-CM

## 2016-11-16 DIAGNOSIS — Z79899 Other long term (current) drug therapy: Secondary | ICD-10-CM | POA: Diagnosis not present

## 2016-11-16 DIAGNOSIS — Z9049 Acquired absence of other specified parts of digestive tract: Secondary | ICD-10-CM

## 2016-11-16 DIAGNOSIS — I700 Atherosclerosis of aorta: Secondary | ICD-10-CM | POA: Diagnosis present

## 2016-11-16 DIAGNOSIS — D649 Anemia, unspecified: Secondary | ICD-10-CM | POA: Diagnosis present

## 2016-11-16 DIAGNOSIS — I272 Pulmonary hypertension, unspecified: Secondary | ICD-10-CM | POA: Diagnosis present

## 2016-11-16 DIAGNOSIS — Z8673 Personal history of transient ischemic attack (TIA), and cerebral infarction without residual deficits: Secondary | ICD-10-CM

## 2016-11-16 DIAGNOSIS — F419 Anxiety disorder, unspecified: Secondary | ICD-10-CM | POA: Diagnosis present

## 2016-11-16 DIAGNOSIS — Z885 Allergy status to narcotic agent status: Secondary | ICD-10-CM

## 2016-11-16 DIAGNOSIS — E119 Type 2 diabetes mellitus without complications: Secondary | ICD-10-CM | POA: Diagnosis present

## 2016-11-16 DIAGNOSIS — Z7951 Long term (current) use of inhaled steroids: Secondary | ICD-10-CM

## 2016-11-16 DIAGNOSIS — R0602 Shortness of breath: Secondary | ICD-10-CM | POA: Diagnosis not present

## 2016-11-16 DIAGNOSIS — I509 Heart failure, unspecified: Secondary | ICD-10-CM | POA: Diagnosis not present

## 2016-11-16 DIAGNOSIS — J45909 Unspecified asthma, uncomplicated: Secondary | ICD-10-CM | POA: Diagnosis present

## 2016-11-16 DIAGNOSIS — I4729 Other ventricular tachycardia: Secondary | ICD-10-CM

## 2016-11-16 DIAGNOSIS — Z9889 Other specified postprocedural states: Secondary | ICD-10-CM | POA: Diagnosis not present

## 2016-11-16 DIAGNOSIS — F329 Major depressive disorder, single episode, unspecified: Secondary | ICD-10-CM | POA: Diagnosis present

## 2016-11-16 DIAGNOSIS — I10 Essential (primary) hypertension: Secondary | ICD-10-CM | POA: Diagnosis not present

## 2016-11-16 DIAGNOSIS — R140 Abdominal distension (gaseous): Secondary | ICD-10-CM

## 2016-11-16 DIAGNOSIS — I110 Hypertensive heart disease with heart failure: Secondary | ICD-10-CM | POA: Diagnosis present

## 2016-11-16 DIAGNOSIS — Z87891 Personal history of nicotine dependence: Secondary | ICD-10-CM

## 2016-11-16 DIAGNOSIS — I340 Nonrheumatic mitral (valve) insufficiency: Secondary | ICD-10-CM | POA: Diagnosis not present

## 2016-11-16 DIAGNOSIS — I447 Left bundle-branch block, unspecified: Secondary | ICD-10-CM | POA: Diagnosis present

## 2016-11-16 DIAGNOSIS — I428 Other cardiomyopathies: Secondary | ICD-10-CM

## 2016-11-16 DIAGNOSIS — I5021 Acute systolic (congestive) heart failure: Secondary | ICD-10-CM | POA: Diagnosis not present

## 2016-11-16 DIAGNOSIS — Z90710 Acquired absence of both cervix and uterus: Secondary | ICD-10-CM | POA: Diagnosis not present

## 2016-11-16 DIAGNOSIS — I472 Ventricular tachycardia: Secondary | ICD-10-CM | POA: Diagnosis present

## 2016-11-16 DIAGNOSIS — Z888 Allergy status to other drugs, medicaments and biological substances status: Secondary | ICD-10-CM

## 2016-11-16 DIAGNOSIS — E785 Hyperlipidemia, unspecified: Secondary | ICD-10-CM | POA: Diagnosis present

## 2016-11-16 DIAGNOSIS — N83209 Unspecified ovarian cyst, unspecified side: Secondary | ICD-10-CM | POA: Diagnosis present

## 2016-11-16 DIAGNOSIS — F32A Depression, unspecified: Secondary | ICD-10-CM | POA: Diagnosis present

## 2016-11-16 DIAGNOSIS — Z8249 Family history of ischemic heart disease and other diseases of the circulatory system: Secondary | ICD-10-CM | POA: Diagnosis not present

## 2016-11-16 DIAGNOSIS — I059 Rheumatic mitral valve disease, unspecified: Secondary | ICD-10-CM | POA: Diagnosis not present

## 2016-11-16 DIAGNOSIS — I5043 Acute on chronic combined systolic (congestive) and diastolic (congestive) heart failure: Secondary | ICD-10-CM | POA: Diagnosis not present

## 2016-11-16 DIAGNOSIS — I051 Rheumatic mitral insufficiency: Secondary | ICD-10-CM | POA: Diagnosis not present

## 2016-11-16 DIAGNOSIS — K5900 Constipation, unspecified: Secondary | ICD-10-CM | POA: Diagnosis present

## 2016-11-16 DIAGNOSIS — I429 Cardiomyopathy, unspecified: Secondary | ICD-10-CM | POA: Diagnosis present

## 2016-11-16 HISTORY — DX: Nontoxic single thyroid nodule: E04.1

## 2016-11-16 HISTORY — DX: Cervicalgia: M54.2

## 2016-11-16 HISTORY — DX: Other generalized epilepsy and epileptic syndromes, not intractable, without status epilepticus: G40.409

## 2016-11-16 HISTORY — DX: Pure hypercholesterolemia, unspecified: E78.00

## 2016-11-16 HISTORY — DX: Essential (primary) hypertension: I10

## 2016-11-16 HISTORY — DX: Other chronic pain: G89.29

## 2016-11-16 HISTORY — DX: Heart failure, unspecified: I50.9

## 2016-11-16 HISTORY — DX: Unspecified osteoarthritis, unspecified site: M19.90

## 2016-11-16 HISTORY — DX: Personal history of other medical treatment: Z92.89

## 2016-11-16 HISTORY — DX: Depression, unspecified: F32.A

## 2016-11-16 HISTORY — DX: Prediabetes: R73.03

## 2016-11-16 HISTORY — DX: Unspecified asthma, uncomplicated: J45.909

## 2016-11-16 HISTORY — DX: Sleep apnea, unspecified: G47.30

## 2016-11-16 HISTORY — DX: Gout, unspecified: M10.9

## 2016-11-16 HISTORY — DX: Unspecified chronic bronchitis: J42

## 2016-11-16 HISTORY — DX: Major depressive disorder, single episode, unspecified: F32.9

## 2016-11-16 HISTORY — DX: Anxiety disorder, unspecified: F41.9

## 2016-11-16 HISTORY — DX: Cardiac murmur, unspecified: R01.1

## 2016-11-16 LAB — URINALYSIS, ROUTINE W REFLEX MICROSCOPIC
BILIRUBIN URINE: NEGATIVE
Glucose, UA: NEGATIVE mg/dL
Hgb urine dipstick: NEGATIVE
KETONES UR: NEGATIVE mg/dL
Nitrite: NEGATIVE
PH: 6 (ref 5.0–8.0)
Protein, ur: 100 mg/dL — AB
SPECIFIC GRAVITY, URINE: 1.024 (ref 1.005–1.030)

## 2016-11-16 LAB — COMPREHENSIVE METABOLIC PANEL
ALT: 55 U/L — AB (ref 14–54)
ANION GAP: 9 (ref 5–15)
AST: 44 U/L — ABNORMAL HIGH (ref 15–41)
Albumin: 3.9 g/dL (ref 3.5–5.0)
Alkaline Phosphatase: 30 U/L — ABNORMAL LOW (ref 38–126)
BUN: 17 mg/dL (ref 6–20)
CALCIUM: 9.4 mg/dL (ref 8.9–10.3)
CHLORIDE: 107 mmol/L (ref 101–111)
CO2: 25 mmol/L (ref 22–32)
CREATININE: 0.95 mg/dL (ref 0.44–1.00)
Glucose, Bld: 108 mg/dL — ABNORMAL HIGH (ref 65–99)
Potassium: 4.1 mmol/L (ref 3.5–5.1)
SODIUM: 141 mmol/L (ref 135–145)
Total Bilirubin: 0.8 mg/dL (ref 0.3–1.2)
Total Protein: 5.9 g/dL — ABNORMAL LOW (ref 6.5–8.1)

## 2016-11-16 LAB — CBC
HCT: 37.9 % (ref 36.0–46.0)
HEMOGLOBIN: 11.8 g/dL — AB (ref 12.0–15.0)
MCH: 30.1 pg (ref 26.0–34.0)
MCHC: 31.1 g/dL (ref 30.0–36.0)
MCV: 96.7 fL (ref 78.0–100.0)
PLATELETS: 301 10*3/uL (ref 150–400)
RBC: 3.92 MIL/uL (ref 3.87–5.11)
RDW: 15.7 % — ABNORMAL HIGH (ref 11.5–15.5)
WBC: 9.2 10*3/uL (ref 4.0–10.5)

## 2016-11-16 LAB — TROPONIN I: Troponin I: <0.03 ng/mL (ref <0.03)

## 2016-11-16 LAB — GLUCOSE, CAPILLARY: GLUCOSE-CAPILLARY: 125 mg/dL — AB (ref 65–99)

## 2016-11-16 LAB — LIPASE, BLOOD: LIPASE: 23 U/L (ref 11–51)

## 2016-11-16 LAB — BRAIN NATRIURETIC PEPTIDE: B Natriuretic Peptide: 673.4 pg/mL — ABNORMAL HIGH (ref 0.0–100.0)

## 2016-11-16 MED ORDER — ESCITALOPRAM OXALATE 10 MG PO TABS
20.0000 mg | ORAL_TABLET | Freq: Every day | ORAL | Status: DC
  Administered 2016-11-17 – 2016-11-23 (×7): 20 mg via ORAL
  Filled 2016-11-16 (×8): qty 2

## 2016-11-16 MED ORDER — ENOXAPARIN SODIUM 40 MG/0.4ML ~~LOC~~ SOLN
40.0000 mg | SUBCUTANEOUS | Status: DC
  Administered 2016-11-16: 40 mg via SUBCUTANEOUS
  Filled 2016-11-16: qty 0.4

## 2016-11-16 MED ORDER — SODIUM CHLORIDE 0.9% FLUSH
3.0000 mL | Freq: Two times a day (BID) | INTRAVENOUS | Status: DC
Start: 2016-11-16 — End: 2016-11-23
  Administered 2016-11-16 – 2016-11-22 (×12): 3 mL via INTRAVENOUS

## 2016-11-16 MED ORDER — ACETAMINOPHEN 325 MG PO TABS
650.0000 mg | ORAL_TABLET | ORAL | Status: DC | PRN
  Administered 2016-11-18: 650 mg via ORAL
  Filled 2016-11-16: qty 2

## 2016-11-16 MED ORDER — POLYVINYL ALCOHOL 1.4 % OP SOLN
1.0000 [drp] | Freq: Two times a day (BID) | OPHTHALMIC | Status: DC | PRN
  Filled 2016-11-16: qty 15

## 2016-11-16 MED ORDER — SODIUM CHLORIDE 0.9 % IV SOLN: 250.0000 mL | INTRAVENOUS | Status: DC | PRN

## 2016-11-16 MED ORDER — SODIUM CHLORIDE 0.9% FLUSH: 3.0000 mL | INTRAVENOUS | Status: DC | PRN

## 2016-11-16 MED ORDER — VITAMIN B-12 1000 MCG PO TABS
2500.0000 ug | ORAL_TABLET | Freq: Every day | ORAL | Status: DC
  Administered 2016-11-17 – 2016-11-23 (×7): 2500 ug via ORAL
  Filled 2016-11-16 (×7): qty 3

## 2016-11-16 MED ORDER — VITAMIN D 1000 UNITS PO TABS
1000.0000 [IU] | ORAL_TABLET | Freq: Every day | ORAL | Status: DC
  Administered 2016-11-17 – 2016-11-23 (×7): 1000 [IU] via ORAL
  Filled 2016-11-16 (×7): qty 1

## 2016-11-16 MED ORDER — ASPIRIN EC 81 MG PO TBEC
81.0000 mg | DELAYED_RELEASE_TABLET | Freq: Every day | ORAL | Status: DC
  Administered 2016-11-17 – 2016-11-20 (×4): 81 mg via ORAL
  Filled 2016-11-16 (×4): qty 1

## 2016-11-16 MED ORDER — ALPRAZOLAM 0.25 MG PO TABS
0.1250 mg | ORAL_TABLET | Freq: Every evening | ORAL | Status: DC | PRN
  Administered 2016-11-16 – 2016-11-22 (×6): 0.125 mg via ORAL
  Filled 2016-11-16 (×7): qty 1

## 2016-11-16 MED ORDER — ALLOPURINOL 300 MG PO TABS
300.0000 mg | ORAL_TABLET | Freq: Every day | ORAL | Status: DC
  Administered 2016-11-17 – 2016-11-23 (×7): 300 mg via ORAL
  Filled 2016-11-16 (×7): qty 1

## 2016-11-16 MED ORDER — ONDANSETRON HCL 4 MG/2ML IJ SOLN
4.0000 mg | Freq: Four times a day (QID) | INTRAMUSCULAR | Status: DC | PRN
  Filled 2016-11-16: qty 2

## 2016-11-16 MED ORDER — FUROSEMIDE 10 MG/ML IJ SOLN
40.0000 mg | INTRAMUSCULAR | Status: AC
  Administered 2016-11-16: 40 mg via INTRAVENOUS
  Filled 2016-11-16: qty 4

## 2016-11-16 MED ORDER — ALBUTEROL SULFATE (2.5 MG/3ML) 0.083% IN NEBU: 3.0000 mL | INHALATION_SOLUTION | Freq: Four times a day (QID) | RESPIRATORY_TRACT | Status: DC | PRN

## 2016-11-16 MED ORDER — SIMVASTATIN 20 MG PO TABS
20.0000 mg | ORAL_TABLET | Freq: Every day | ORAL | Status: DC
  Administered 2016-11-16 – 2016-11-22 (×7): 20 mg via ORAL
  Filled 2016-11-16 (×7): qty 1

## 2016-11-16 MED ORDER — FLUTICASONE FUROATE-VILANTEROL 200-25 MCG/INH IN AEPB
1.0000 | INHALATION_SPRAY | Freq: Every day | RESPIRATORY_TRACT | Status: DC
  Administered 2016-11-17 – 2016-11-22 (×5): 1 via RESPIRATORY_TRACT
  Filled 2016-11-16: qty 28

## 2016-11-16 MED ORDER — FLUTICASONE PROPIONATE 50 MCG/ACT NA SUSP
1.0000 | Freq: Every day | NASAL | Status: DC | PRN
  Filled 2016-11-16: qty 16

## 2016-11-16 MED ORDER — FUROSEMIDE 10 MG/ML IJ SOLN
40.0000 mg | Freq: Two times a day (BID) | INTRAMUSCULAR | Status: DC
  Administered 2016-11-17 – 2016-11-20 (×7): 40 mg via INTRAVENOUS
  Filled 2016-11-16 (×7): qty 4

## 2016-11-16 NOTE — ED Provider Notes (Signed)
[facility]-EMERGENCY DEPT Provider Note   CSN: [id number] Arrival date & time: 11/16/16  1335     History   Chief Complaint Chief Complaint  Patient presents with  . Abdominal Pain  . nausea-gained 10 lbs x 5 days    HPI Margaret Vargas is a 66 y.o. female.  Patient presents to the ED with a chief complaint of abdominal swelling/pain.  She states that over the past several weeks she has had SOB, which is associated with exertion and lying down.  She reports associated abdominal distension and lower extremity swelling.  She denies any history of CHF.  She states that she was seen here several days ago for the same.  She had a CT scan which showed ovarian cyst.  She was instructed to follow-up with her doctor, which she did today, and was advised to come back to the ER for worsening symptoms.   The history is provided by the patient. No language interpreter was used.    Past Medical History:  Diagnosis Date  . Diabetes mellitus   . Stroke (HCC)   . TIA (transient ischemic attack)     Patient Active Problem List   Diagnosis Date Noted  . TIA (transient ischemic attack)     Past Surgical History:  Procedure Laterality Date  . ABDOMINAL HYSTERECTOMY    . arm surgery  2010  . BACK SURGERY  2007  . CHOLECYSTECTOMY    . INNER EAR SURGERY    . OOPHORECTOMY      OB History    No data available       Home Medications    Prior to Admission medications   Medication Sig Start Date End Date Taking? Authorizing Provider  acetaminophen-codeine (TYLENOL #3) 300-30 MG per tablet Take 1 tablet by mouth every 6 (six) hours as needed for moderate pain.    Historical Provider, MD  allopurinol (ZYLOPRIM) 300 MG tablet Take 300 mg by mouth daily.    Historical Provider, MD  amLODipine (NORVASC) 5 MG tablet Take 5 mg by mouth at bedtime.    Historical Provider, MD  aspirin EC 81 MG tablet Take 81 mg by mouth daily.    Historical Provider, MD  candesartan (ATACAND) 32 MG tablet Take 32  mg by mouth daily.    Historical Provider, MD  cetirizine (ZYRTEC) 10 MG tablet Take 10 mg by mouth daily.    Historical Provider, MD  cholecalciferol (VITAMIN D) 1000 UNITS tablet Take 1,000 Units by mouth daily.    Historical Provider, MD  Cyanocobalamin (VITAMIN B-12) 2500 MCG SUBL Place 2,500 mcg under the tongue daily.    Historical Provider, MD  cyclobenzaprine (FLEXERIL) 10 MG tablet Take 10 mg by mouth 3 (three) times daily.    Historical Provider, MD  escitalopram (LEXAPRO) 20 MG tablet Take 20 mg by mouth daily.    Historical Provider, MD  etodolac (LODINE) 400 MG tablet Take 400 mg by mouth 2 (two) times daily.    Historical Provider, MD  fluticasone (FLONASE) 50 MCG/ACT nasal spray Place 1 spray into both nostrils daily as needed for allergies or rhinitis.    Historical Provider, MD  hydrochlorothiazide (HYDRODIURIL) 25 MG tablet Take 25 mg by mouth daily.    Historical Provider, MD  ibuprofen (ADVIL,MOTRIN) 200 MG tablet Take 400 mg by mouth 2 (two) times daily as needed (pain).    Historical Provider, MD  meloxicam (MOBIC) 15 MG tablet Take 1 tablet (15 mg total) by mouth daily. Patient taking differently: Take  15 mg by mouth daily as needed (plantar fasciatis).  01/10/14   Alvan Dame, DPM  Propylene Glycol (SYSTANE BALANCE) 0.6 % SOLN Place 1 drop into both eyes 2 (two) times daily.    Historical Provider, MD  simvastatin (ZOCOR) 20 MG tablet Take 20 mg by mouth at bedtime.     Historical Provider, MD    Family History Family History  Problem Relation Age of Onset  . Hypertension Mother     Social History Social History  Substance Use Topics  . Smoking status: Former Games developer  . Smokeless tobacco: Never Used     Comment: quit 30 years ago  . Alcohol use No     Allergies   Morphine and related and Other   Review of Systems Review of Systems  Respiratory: Positive for shortness of breath.   Cardiovascular: Positive for leg swelling.  Gastrointestinal: Positive  for abdominal distention, nausea and vomiting.  All other systems reviewed and are negative.    Physical Exam Updated Vital Signs BP 123/77 (BP Location: Left Arm)   Pulse 98   Temp 98.5 F (36.9 C) (Oral)   Resp 18   SpO2 96%   Physical Exam  Constitutional: She is oriented to person, place, and time. She appears well-developed and well-nourished.  HENT:  Head: Normocephalic and atraumatic.  Eyes: Conjunctivae and EOM are normal. Pupils are equal, round, and reactive to light.  Neck: Normal range of motion. Neck supple.  Cardiovascular: Normal rate and regular rhythm.  Exam reveals no gallop and no friction rub.   No murmur heard. Pulmonary/Chest: Effort normal and breath sounds normal. No respiratory distress. She has no wheezes. She has no rales. She exhibits no tenderness.  Abdominal: Soft. Bowel sounds are normal. She exhibits no distension and no mass. There is no tenderness. There is no rebound and no guarding.  Moderate, generalized abdominal tenderness  Musculoskeletal: Normal range of motion. She exhibits edema. She exhibits no tenderness.  Neurological: She is alert and oriented to person, place, and time.  Skin: Skin is warm and dry.  Psychiatric: She has a normal mood and affect. Her behavior is normal. Judgment and thought content normal.  Nursing note and vitals reviewed.    ED Treatments / Results  Labs (all labs ordered are listed, but only abnormal results are displayed) Labs Reviewed  COMPREHENSIVE METABOLIC PANEL - Abnormal; Notable for the following:       Result Value   Glucose, Bld 108 (*)    Total Protein 5.9 (*)    AST 44 (*)    ALT 55 (*)    Alkaline Phosphatase 30 (*)    All other components within normal limits  CBC - Abnormal; Notable for the following:    Hemoglobin 11.8 (*)    RDW 15.7 (*)    All other components within normal limits  URINALYSIS, ROUTINE W REFLEX MICROSCOPIC - Abnormal; Notable for the following:    APPearance HAZY (*)     Protein, ur 100 (*)    Leukocytes, UA TRACE (*)    Bacteria, UA RARE (*)    Squamous Epithelial / LPF 0-5 (*)    All other components within normal limits  BRAIN NATRIURETIC PEPTIDE - Abnormal; Notable for the following:    B Natriuretic Peptide 673.4 (*)    All other components within normal limits  LIPASE, BLOOD  TROPONIN I    EKG  EKG Interpretation None       Radiology Dg Chest 2 View  Result Date: 11/16/2016 CLINICAL DATA:  Distended abdomen. Shortness of breath. Fluid weight gain. EXAM: CHEST  2 VIEW COMPARISON:  10/07/2016 FINDINGS: Heart is enlarged. There is pulmonary venous hypertension with mild interstitial edema. Tiny amount of fluid in the pleural space. No collapse or infiltrate. No acute bone finding. IMPRESSION: Congestive heart failure/fluid overload. Enlarged cardiac silhouette. Venous hypertension. Mild interstitial edema and small effusions Electronically Signed   By: Paulina Fusi M.D.   On: 11/16/2016 16:48    Procedures Procedures (including critical care time)  Medications Ordered in ED Medications  furosemide (LASIX) injection 40 mg (40 mg Intravenous Given 11/16/16 1722)     Initial Impression / Assessment and Plan / ED Course  I have reviewed the triage vital signs and the nursing notes.  Pertinent labs & imaging results that were available during my care of the patient were reviewed by me and considered in my medical decision making (see chart for details).     Patient with multiple complaints.  Complains of abdominal, SOB, and abdominal distension.  Recent CT was largely reassuring.  Labs are reassuring.  She has had progressively worsening SOB, weight gain, and persistent symptoms.    Will check CXR and BNP.  Will reassess.  CXR as above, consistent with CHF/fluid overload.  BNP is 673.  Troponin is normal.  Patient seen by and discussed with Dr. Adela Lank, who agrees with plan for admission for new onset CHF.  Appreciate Dr. Antionette Char for  bringing patient into the hospital.  Final Clinical Impressions(s) / ED Diagnoses   Final diagnoses:  Acute congestive heart failure, unspecified congestive heart failure type (HCC)  Leg swelling  SOB (shortness of breath)  Abdominal distension    New Prescriptions New Prescriptions   No medications on file     Roxy Horseman, PA-C 11/16/16 1827    Melene Plan, DO 11/16/16 1839

## 2016-11-16 NOTE — ED Notes (Signed)
PT did not have to use RR at this time  

## 2016-11-16 NOTE — ED Triage Notes (Signed)
Patient here on Friday night for severe abdominal pain. States that her labs and CT was normal. Now having ongoing bloating, nausea and mucus stool. States that she has gained 10lbs since being in ED. Appears uncomfortable on arrival

## 2016-11-16 NOTE — H&P (Signed)
History and Physical    Margaret Vargas OZD:[id number] DOB: [birth year] DOA: 11/16/2016  PCP: Emeterio Reeve, MD   Patient coming from: Home  Chief Complaint: DOE, orthopnea, abdominal bloating, pedal edema   HPI: Margaret Vargas is a 66 y.o. female with medical history significant for hypertension, history of TIA, and depression with anxiety who presents to the emergency department with 1-2 months of exertional dyspnea and orthopnea with acute worsening over the past several days. Patient reports that she been in her usual state of health until approximately one to 2 months ago when she noted the insidious development of exertional dyspnea, describing shortness of breath upon walking across a room. She had also developed orthopnea around the same time and has been sleeping upright. Over the same interval, she has noted some progressive abdominal fullness and now distention. She had been experiencing severe abdominal pain last week and had alternating diarrhea and constipation. She reports that the abdominal pain has resolved but she believe she has some constipation currently. She denies any history of MI and denies any chest pain or palpitations. She does not drink alcohol or use illicit substances. She has never had these symptoms previously.  ED Course: Upon arrival to the ED, patient is found to be afebrile, saturating well on room air, tachycardic in the low 100s, and with vitals otherwise stable. EKG features a sinus tachycardia with rate 104, PVCs, LAD, and nonspecific IVCD. Chest x-ray findings are consistent with CHF/volume overload with cardiomegaly and mild interstitial edema as well as small effusions. Chemistry panels largely unremarkable and CBC is notable for a mild normocytic anemia with hemoglobin of 11.8. Troponin is undetectable and BNP is elevated to 673. Urinalysis does not suggest infection and is otherwise unremarkable. Patient was treated with 40 mg of IV Lasix in the ED  and has begun to diurese appropriately. She has remained hemodynamically stable and in no acute respiratory distress and will be admitted to the telemetry unit for ongoing evaluation and management of exertional dyspnea, orthopnea, and peripheral edema suspected secondary to acute CHF, not previously diagnosed.  Review of Systems:  All other systems reviewed and apart from HPI, are negative.  Past Medical History:  Diagnosis Date  . Diabetes mellitus   . Stroke (HCC)   . TIA (transient ischemic attack)     Past Surgical History:  Procedure Laterality Date  . ABDOMINAL HYSTERECTOMY    . arm surgery  2010  . BACK SURGERY  2007  . CHOLECYSTECTOMY    . INNER EAR SURGERY    . OOPHORECTOMY       reports that she has quit smoking. She has never used smokeless tobacco. She reports that she does not drink alcohol or use drugs.  Allergies  Allergen Reactions  . Other Other (See Comments)    Bandaids and some adhesives cause red skin. (pls use paper tape)  . Adhesive [Tape] Itching and Rash  . Morphine And Related Itching    Family History  Problem Relation Age of Onset  . Hypertension Mother      Prior to Admission medications   Medication Sig Start Date End Date Taking? Authorizing Provider  albuterol (PROVENTIL HFA;VENTOLIN HFA) 108 (90 Base) MCG/ACT inhaler Inhale 2 puffs into the lungs every 6 (six) hours as needed for wheezing or shortness of breath.   Yes Historical Provider, MD  allopurinol (ZYLOPRIM) 300 MG tablet Take 300 mg by mouth daily.   Yes Historical Provider, MD  ALPRAZolam Prudy Feeler) 0.25 MG tablet Take 0.125  mg by mouth at bedtime as needed for anxiety.   Yes Historical Provider, MD  aspirin EC 81 MG tablet Take 81 mg by mouth every morning.    Yes Historical Provider, MD  candesartan (ATACAND) 32 MG tablet Take 32 mg by mouth daily.   Yes Historical Provider, MD  cholecalciferol (VITAMIN D) 1000 UNITS tablet Take 1,000 Units by mouth daily.   Yes Historical  Provider, MD  colchicine 0.6 MG tablet Take 0.6 mg by mouth daily as needed (FLAREUPS).   Yes Historical Provider, MD  Cyanocobalamin (VITAMIN B-12) 2500 MCG SUBL Place 2,500 mcg under the tongue daily.   Yes Historical Provider, MD  escitalopram (LEXAPRO) 20 MG tablet Take 20 mg by mouth every morning.    Yes Historical Provider, MD  fluticasone (FLONASE) 50 MCG/ACT nasal spray Place 1 spray into both nostrils daily as needed for allergies or rhinitis.   Yes Historical Provider, MD  fluticasone furoate-vilanterol (BREO ELLIPTA) 200-25 MCG/INH AEPB Inhale 1 puff into the lungs daily.   Yes Historical Provider, MD  hydrochlorothiazide (HYDRODIURIL) 25 MG tablet Take 25 mg by mouth daily.   Yes Historical Provider, MD  ibuprofen (ADVIL,MOTRIN) 200 MG tablet Take 400 mg by mouth 2 (two) times daily as needed (pain).   Yes Historical Provider, MD  levalbuterol Pauline Aus) 0.63 MG/3ML nebulizer solution Take 0.63 mg by nebulization every 4 (four) hours as needed for wheezing or shortness of breath.   Yes Historical Provider, MD  Propylene Glycol (SYSTANE BALANCE) 0.6 % SOLN Place 1 drop into both eyes 2 (two) times daily as needed (DRY EYES).    Yes Historical Provider, MD  simvastatin (ZOCOR) 20 MG tablet Take 20 mg by mouth at bedtime.     Historical Provider, MD    Physical Exam: Vitals:   11/16/16 1845 11/16/16 1900 11/16/16 1915 11/16/16 1930  BP: 124/88 119/77 (!) 102/57 113/74  Pulse: (!) 103 96 94 94  Resp: 18 (!) 22 (!) 23 (!) 29  Temp:      TempSrc:      SpO2: 99% 99% 98% 95%      Constitutional: No acute distress. Dyspneic with speech. No pallor or diaphoresis.  Eyes: PERTLA, lids and conjunctivae normal ENMT: Mucous membranes are moist. Posterior pharynx clear of any exudate or lesions.   Neck: normal, supple, no masses, no thyromegaly Respiratory: Diminished in the bases, crackles at bilateral mid-lung zones. Increased work of breathing. No cyanosis or pallor.  Cardiovascular:  Rate ~100 and regular. 1+ pretibial edema bilaterally. JVP not well-visualized. Abdomen: mild distension, soft, no tenderness, no masses palpated. Bowel sounds normal.  Musculoskeletal: no clubbing / cyanosis. No joint deformity upper and lower extremities.   Skin: no significant rashes, lesions, ulcers. Warm, dry, well-perfused. Neurologic: CN 2-12 grossly intact. Sensation intact, DTR normal. Strength 5/5 in all 4 limbs.  Psychiatric: Alert and oriented x 3. Normal mood and affect.     Labs on Admission: I have personally reviewed following labs and imaging studies  CBC:  Recent Labs Lab 11/12/16 0343 11/16/16 1342  WBC 9.6 9.2  NEUTROABS 6.4  --   HGB 12.0 11.8*  HCT 38.5 37.9  MCV 97.7 96.7  PLT 291 301   Basic Metabolic Panel:  Recent Labs Lab 11/12/16 0343 11/16/16 1342  NA 141 141  K 4.2 4.1  CL 106 107  CO2 26 25  GLUCOSE 133* 108*  BUN 14 17  CREATININE 0.90 0.95  CALCIUM 9.0 9.4   GFR: CrCl cannot be  calculated (Unknown ideal weight.). Liver Function Tests:  Recent Labs Lab 11/12/16 0343 11/16/16 1342  AST 28 44*  ALT 31 55*  ALKPHOS 33* 30*  BILITOT 0.8 0.8  PROT 6.5 5.9*  ALBUMIN 4.0 3.9    Recent Labs Lab 11/12/16 0343 11/16/16 1342  LIPASE 63* 23   No results for input(s): AMMONIA in the last 168 hours. Coagulation Profile: No results for input(s): INR, PROTIME in the last 168 hours. Cardiac Enzymes:  Recent Labs Lab 11/16/16 1703  TROPONINI <0.03   BNP (last 3 results) No results for input(s): PROBNP in the last 8760 hours. HbA1C: No results for input(s): HGBA1C in the last 72 hours. CBG: No results for input(s): GLUCAP in the last 168 hours. Lipid Profile: No results for input(s): CHOL, HDL, LDLCALC, TRIG, CHOLHDL, LDLDIRECT in the last 72 hours. Thyroid Function Tests: No results for input(s): TSH, T4TOTAL, FREET4, T3FREE, THYROIDAB in the last 72 hours. Anemia Panel: No results for input(s): VITAMINB12, FOLATE,  FERRITIN, TIBC, IRON, RETICCTPCT in the last 72 hours. Urine analysis:    Component Value Date/Time   COLORURINE YELLOW 11/16/2016 1730   APPEARANCEUR HAZY (A) 11/16/2016 1730   LABSPEC 1.024 11/16/2016 1730   PHURINE 6.0 11/16/2016 1730   GLUCOSEU NEGATIVE 11/16/2016 1730   HGBUR NEGATIVE 11/16/2016 1730   BILIRUBINUR NEGATIVE 11/16/2016 1730   KETONESUR NEGATIVE 11/16/2016 1730   PROTEINUR 100 (A) 11/16/2016 1730   UROBILINOGEN 1.0 05/07/2012 1554   NITRITE NEGATIVE 11/16/2016 1730   LEUKOCYTESUR TRACE (A) 11/16/2016 1730   Sepsis Labs: @LABRCNTIP (procalcitonin:4,lacticidven:4) ) Recent Results (from the past 240 hour(s))  C difficile quick scan w PCR reflex     Status: None   Collection Time: 11/12/16  4:25 AM  Result Value Ref Range Status   C Diff antigen NEGATIVE NEGATIVE Final   C Diff toxin NEGATIVE NEGATIVE Final   C Diff interpretation No C. difficile detected.  Final  Urine culture     Status: Abnormal   Collection Time: 11/12/16  5:24 AM  Result Value Ref Range Status   Specimen Description URINE, CLEAN CATCH  Final   Special Requests NONE  Final   Culture MULTIPLE SPECIES PRESENT, SUGGEST RECOLLECTION (A)  Final   Report Status 11/13/2016 FINAL  Final     Radiological Exams on Admission: Dg Chest 2 View  Result Date: 11/16/2016 CLINICAL DATA:  Distended abdomen. Shortness of breath. Fluid weight gain. EXAM: CHEST  2 VIEW COMPARISON:  10/07/2016 FINDINGS: Heart is enlarged. There is pulmonary venous hypertension with mild interstitial edema. Tiny amount of fluid in the pleural space. No collapse or infiltrate. No acute bone finding. IMPRESSION: Congestive heart failure/fluid overload. Enlarged cardiac silhouette. Venous hypertension. Mild interstitial edema and small effusions Electronically Signed   By: Paulina Fusi M.D.   On: 11/16/2016 16:48    EKG: Independently reviewed. Sinus tachycardia (rate 104), PVC's, LAD, non-specific IVCD   Assessment/Plan  1.  Acute CHF   - Pt presents with 1-2 mos progressive DOE, wt gain, and orthopnea, worsening acutely in recent days - Noted to have mild BLE edema, dyspnea with speech - CXR with cardiomegaly, interstitial edema, and small effusions; BNP 673  - Suspect CHF, compensating until recently  - She is diuresing after 40 mg IV Lasix given in ED  - Plan to continue diuresis with Lasix 40 mg IV q12h, adjusted prn   - Monitor on telemetry, follow daily wts and strict I/O's, SLIV, follow daily BMP during diuresis  - Echocardiogram ordered  2. Hypertension  - BP is at goal  - Managed at home with HCTZ and candesartan; these are held on admission  - Monitor, consider adding beta-blocker once better compensated; resume ARB pending stable renal fxn with diuresis    3. History of TIA  - No acute neurologic s/s  - Continue daily statin and ASA 81    4. Depression, anxiety  - Appears quite stable  - Continue Lexapro and low-dose prn Xanax   DVT prophylaxis: sq Lovenox Code Status: Full  Family Communication: Husband updated at bedside Disposition Plan: Admit to telemetry Consults called: None Admission status: Inpatient    Briscoe Deutscher, MD [facility] Pager [phone]  If 7PM-7AM, please contact night-coverage [email or web address] Password [facility]  11/16/2016, 8:24 PM

## 2016-11-17 ENCOUNTER — Inpatient Hospital Stay (HOSPITAL_COMMUNITY): Payer: BLUE CROSS/BLUE SHIELD

## 2016-11-17 ENCOUNTER — Other Ambulatory Visit (HOSPITAL_COMMUNITY): Payer: BLUE CROSS/BLUE SHIELD

## 2016-11-17 DIAGNOSIS — R140 Abdominal distension (gaseous): Secondary | ICD-10-CM

## 2016-11-17 LAB — ECHOCARDIOGRAM COMPLETE
AOASC: 32 cm
CHL CUP MV DEC (S): 222
E/e' ratio: 30.6
EWDT: 222 ms
FS: 21 % — AB (ref 28–44)
HEIGHTINCHES: 69 in
IV/PV OW: 0.77
LA diam end sys: 47 mm
LA vol index: 32.6 mL/m2
LA vol: 86.8 mL
LADIAMINDEX: 1.76 cm/m2
LASIZE: 47 mm
LAVOLA4C: 76.5 mL
LV E/e' medial: 30.6
LV E/e'average: 30.6
LV TDI E'LATERAL: 5.36
LV e' LATERAL: 5.36 cm/s
LVOT VTI: 15.1 cm
LVOT area: 3.14 cm2
LVOT peak vel: 101 cm/s
LVOTD: 20 mm
LVOTSV: 47 mL
Lateral S' vel: 7.51 cm/s
MRPISAEROA: 0.46 cm2
MV Peak grad: 11 mmHg
MV VTI: 154 cm
MVPKEVEL: 164 m/s
PW: 13.1 mm — AB (ref 0.6–1.1)
Reg peak vel: 349 cm/s
S' Lateral: 4.4 cm/s
TAPSE: 17.3 mm
TDI e' medial: 5.51
TRMAXVEL: 349 cm/s
WEIGHTICAEL: 4883.2 [oz_av]

## 2016-11-17 LAB — BASIC METABOLIC PANEL
Anion gap: 8 (ref 5–15)
BUN: 16 mg/dL (ref 6–20)
CALCIUM: 9.4 mg/dL (ref 8.9–10.3)
CHLORIDE: 105 mmol/L (ref 101–111)
CO2: 30 mmol/L (ref 22–32)
CREATININE: 1.03 mg/dL — AB (ref 0.44–1.00)
GFR, EST NON AFRICAN AMERICAN: 56 mL/min — AB (ref >60)
Glucose, Bld: 121 mg/dL — ABNORMAL HIGH (ref 65–99)
Potassium: 3.8 mmol/L (ref 3.5–5.1)
SODIUM: 143 mmol/L (ref 135–145)

## 2016-11-17 LAB — HIV ANTIBODY (ROUTINE TESTING W REFLEX): HIV SCREEN 4TH GENERATION: NONREACTIVE

## 2016-11-17 MED ORDER — POLYETHYLENE GLYCOL 3350 17 G PO PACK
17.0000 g | PACK | Freq: Every day | ORAL | Status: DC
  Administered 2016-11-17 – 2016-11-18 (×2): 17 g via ORAL
  Filled 2016-11-17 (×2): qty 1

## 2016-11-17 MED ORDER — SENNOSIDES-DOCUSATE SODIUM 8.6-50 MG PO TABS
1.0000 | ORAL_TABLET | Freq: Two times a day (BID) | ORAL | Status: DC
  Administered 2016-11-17 – 2016-11-18 (×4): 1 via ORAL
  Filled 2016-11-17 (×4): qty 1

## 2016-11-17 MED ORDER — ALPRAZOLAM 0.25 MG PO TABS
0.1250 mg | ORAL_TABLET | Freq: Once | ORAL | Status: AC
  Administered 2016-11-17: 0.125 mg via ORAL
  Filled 2016-11-17: qty 1

## 2016-11-17 MED ORDER — POTASSIUM CHLORIDE CRYS ER 20 MEQ PO TBCR
40.0000 meq | EXTENDED_RELEASE_TABLET | Freq: Two times a day (BID) | ORAL | Status: DC
  Administered 2016-11-17 – 2016-11-20 (×7): 40 meq via ORAL
  Filled 2016-11-17 (×7): qty 2

## 2016-11-17 MED ORDER — ENOXAPARIN SODIUM 60 MG/0.6ML ~~LOC~~ SOLN
60.0000 mg | SUBCUTANEOUS | Status: DC
  Administered 2016-11-17 – 2016-11-21 (×4): 60 mg via SUBCUTANEOUS
  Filled 2016-11-17 (×4): qty 0.6

## 2016-11-17 NOTE — Progress Notes (Signed)
  Echocardiogram 2D Echocardiogram has been performed.  Janalyn Harder 11/17/2016, 3:31 PM

## 2016-11-17 NOTE — Progress Notes (Signed)
Patient refused bed alarm. Will continue to monitor patient. 

## 2016-11-17 NOTE — Plan of Care (Signed)
Problem: Activity: Goal: Capacity to carry out activities will improve Outcome: Progressing Still dyspneic with exertion; improved from admission   Problem: Cardiac: Goal: Ability to achieve and maintain adequate cardiopulmonary perfusion will improve Outcome: Progressing Oxygen levels maintain in 90's on room air   Problem: Education: Goal: Ability to demonstrate managment of disease process will improve Outcome: Progressing Living with heart failure booklet reviewed with patient; instructed patient on need for weight daily first thing in the morning after voiding and before eating or dressing; reviewed zones and when to notify MD.  Also instructed patient on high and low sodium foods and the fact that sodium will make her body hold on to fluid worsening her edema and shortness of breath; patient verbalized understanding.  Plans to watch the HF video

## 2016-11-17 NOTE — Plan of Care (Signed)
Problem: Education: Goal: Ability to verbalize understanding of medication therapies will improve Outcome: Progressing Discussed with patient use of diuretic (Lasix) to help her body get rid of extra fluid, also discussed how Lasix causes her to lose potassium in her urine therefore potassium has to be supplemented; verbalized understanding.

## 2016-11-17 NOTE — Progress Notes (Signed)
Nurse tech reported to RN that patient said she is short of breath.  Rn went to room with scheduled dose of IV Lasix, patient sitting upright in chair says she feels short of breath.  O2 saturation 100% on room air, no acute respiratory distress.  Patient also c/o cough, refused cough medication.  Patient noted to be belching, refused medication for indigestion.  RN did discuss with patient that sometimes people wear oxygen just for comfort and asked if she wanted to try this for her shortness of breath, patient refused.    40 mg Iv Lasix administered, patient ambulating in hallway.  Feels if her bowels would move that would help, took Miralax and Senokot earlier in shift, refused anything additional at this time.  Encouraged patient to walk in hall to encourage bowel mobility as she is able.

## 2016-11-17 NOTE — Care Management Note (Signed)
Case Management Note  Patient Details  Name: Margaret Vargas MRN: [id number] Date of Birth: 10/31/1950  Subjective/Objective:  Admitted with Acute CHF                Action/Plan: Patient lives at home with spouse; PCP: Emeterio Reeve, MD; has private insurance with [employer]; awaiting on physical therapy eval for disposition needs  Expected Discharge Date:     Possibly 11/21/2016             Expected Discharge Plan:  Home w Home Health Services  Discharge planning Services   In progress  Status of Service:  In process, will continue to follow  Cherrie Distance RN ,MHA,BSN [phone] 11/17/2016, 10:30 AM

## 2016-11-17 NOTE — Progress Notes (Addendum)
PROGRESS NOTE    Margaret Vargas  RCV:[id number] DOB: February 02, 1951 DOA: 11/16/2016 PCP: Emeterio Reeve, MD  Brief Narrative:Margaret Vargas is a 66 y.o. female with medical history significant for hypertension, history of TIA, and depression with anxiety who presents to the emergency department with 1-2 months of exertional dyspnea and orthopnea with acute worsening over the past several days. Chest x-ray findings are consistent with CHF/volume overload with cardiomegaly and mild interstitial edema as well as small effusions.   Assessment & Plan:    1. Acute CHF   - Pt presents with 1-2 mos progressive DOE, wt gain, and orthopnea, worsening acutely in recent days - breathing improving but still dyspneic with minimal activity - CXR with cardiomegaly, interstitial edema, and small effusions; BNP 673  - continue Lasix 40 mg IV q12h -FU ECHO -monitor I/O, weights -CHF education  2. Hypertension  -stable, at home on HCTZ and candesartan; these are held on admission   3. History of TIA  - No acute neurologic s/s  - Continue daily statin and ASA 81    4. Belching/bloating -could be related to fluid overload, recent Abx use -monitor  4. Depression, anxiety  - Appears quite stable  - Continue Lexapro and low-dose prn Xanax  DVT prophylaxis: sq Lovenox Code Status: Full  Family Communication: none at bedside Disposition Plan: home in few days once adequately diuresed  Consultants:      Subjective: Breathing better, not at baseline yet, belching a lot  Objective: Vitals:   11/17/16 0133 11/17/16 0649 11/17/16 0841 11/17/16 1044  BP: 120/72 138/81  130/74  Pulse: 91 85  89  Resp: 20 20    Temp: 97.9 F (36.6 C) 97.9 F (36.6 C)    TempSrc: Oral Oral    SpO2: 98% 99% 98% 99%  Weight:  (!) 138.4 kg (305 lb 3.2 oz)    Height:        Intake/Output Summary (Last 24 hours) at 11/17/16 1155 Last data filed at 11/17/16 1042  Gross per 24 hour  Intake               480 ml  Output             3000 ml  Net            -2520 ml   Filed Weights   11/16/16 2057 11/17/16 0649  Weight: (!) 138.5 kg (305 lb 6.4 oz) (!) 138.4 kg (305 lb 3.2 oz)    Examination:  General exam: Appears calm and comfortable  Respiratory system: few basilar crackles Cardiovascular system: S1 & S2 heard, RRR. No JVD, murmurs, rubs, gallops  Gastrointestinal system: Abdomen is nondistended, soft and nontender. No organomegaly or masses felt. Normal bowel sounds heard. Central nervous system: Alert and oriented. No focal neurological deficits. Extremities: Symmetric 5 x 5 power, 1 plus edema Skin: No rashes, lesions or ulcers Psychiatry: Judgement and insight appear normal. Mood & affect appropriate.     Data Reviewed:   CBC:  Recent Labs Lab 11/12/16 0343 11/16/16 1342  WBC 9.6 9.2  NEUTROABS 6.4  --   HGB 12.0 11.8*  HCT 38.5 37.9  MCV 97.7 96.7  PLT 291 301   Basic Metabolic Panel:  Recent Labs Lab 11/12/16 0343 11/16/16 1342 11/17/16 0356  NA 141 141 143  K 4.2 4.1 3.8  CL 106 107 105  CO2 26 25 30   GLUCOSE 133* 108* 121*  BUN 14 17 16   CREATININE 0.90 0.95 1.03*  CALCIUM  9.0 9.4 9.4   GFR: Estimated Creatinine Clearance: 82.8 mL/min (A) (by C-G formula based on SCr of 1.03 mg/dL (H)). Liver Function Tests:  Recent Labs Lab 11/12/16 0343 11/16/16 1342  AST 28 44*  ALT 31 55*  ALKPHOS 33* 30*  BILITOT 0.8 0.8  PROT 6.5 5.9*  ALBUMIN 4.0 3.9    Recent Labs Lab 11/12/16 0343 11/16/16 1342  LIPASE 63* 23   No results for input(s): AMMONIA in the last 168 hours. Coagulation Profile: No results for input(s): INR, PROTIME in the last 168 hours. Cardiac Enzymes:  Recent Labs Lab 11/16/16 1703  TROPONINI <0.03   BNP (last 3 results) No results for input(s): PROBNP in the last 8760 hours. HbA1C: No results for input(s): HGBA1C in the last 72 hours. CBG:  Recent Labs Lab 11/16/16 2120  GLUCAP 125*   Lipid Profile: No  results for input(s): CHOL, HDL, LDLCALC, TRIG, CHOLHDL, LDLDIRECT in the last 72 hours. Thyroid Function Tests: No results for input(s): TSH, T4TOTAL, FREET4, T3FREE, THYROIDAB in the last 72 hours. Anemia Panel: No results for input(s): VITAMINB12, FOLATE, FERRITIN, TIBC, IRON, RETICCTPCT in the last 72 hours. Urine analysis:    Component Value Date/Time   COLORURINE YELLOW 11/16/2016 1730   APPEARANCEUR HAZY (A) 11/16/2016 1730   LABSPEC 1.024 11/16/2016 1730   PHURINE 6.0 11/16/2016 1730   GLUCOSEU NEGATIVE 11/16/2016 1730   HGBUR NEGATIVE 11/16/2016 1730   BILIRUBINUR NEGATIVE 11/16/2016 1730   KETONESUR NEGATIVE 11/16/2016 1730   PROTEINUR 100 (A) 11/16/2016 1730   UROBILINOGEN 1.0 05/07/2012 1554   NITRITE NEGATIVE 11/16/2016 1730   LEUKOCYTESUR TRACE (A) 11/16/2016 1730   Sepsis Labs: @LABRCNTIP (procalcitonin:4,lacticidven:4)  ) Recent Results (from the past 240 hour(s))  C difficile quick scan w PCR reflex     Status: None   Collection Time: 11/12/16  4:25 AM  Result Value Ref Range Status   C Diff antigen NEGATIVE NEGATIVE Final   C Diff toxin NEGATIVE NEGATIVE Final   C Diff interpretation No C. difficile detected.  Final  Urine culture     Status: Abnormal   Collection Time: 11/12/16  5:24 AM  Result Value Ref Range Status   Specimen Description URINE, CLEAN CATCH  Final   Special Requests NONE  Final   Culture MULTIPLE SPECIES PRESENT, SUGGEST RECOLLECTION (A)  Final   Report Status 11/13/2016 FINAL  Final         Radiology Studies: Dg Chest 2 View  Result Date: 11/16/2016 CLINICAL DATA:  Distended abdomen. Shortness of breath. Fluid weight gain. EXAM: CHEST  2 VIEW COMPARISON:  10/07/2016 FINDINGS: Heart is enlarged. There is pulmonary venous hypertension with mild interstitial edema. Tiny amount of fluid in the pleural space. No collapse or infiltrate. No acute bone finding. IMPRESSION: Congestive heart failure/fluid overload. Enlarged cardiac silhouette.  Venous hypertension. Mild interstitial edema and small effusions Electronically Signed   By: Paulina Fusi M.D.   On: 11/16/2016 16:48        Scheduled Meds: . allopurinol  300 mg Oral Daily  . aspirin EC  81 mg Oral Daily  . cholecalciferol  1,000 Units Oral Daily  . enoxaparin (LOVENOX) injection  40 mg Subcutaneous Q24H  . escitalopram  20 mg Oral Daily  . fluticasone furoate-vilanterol  1 puff Inhalation Daily  . furosemide  40 mg Intravenous Q12H  . polyethylene glycol  17 g Oral Daily  . senna-docusate  1 tablet Oral BID  . simvastatin  20 mg Oral QHS  .  sodium chloride flush  3 mL Intravenous Q12H  . vitamin B-12  2,500 mcg Oral Daily   Continuous Infusions:   LOS: 1 day    Time spent:69min    Zannie Cove, MD [facility] Pager [phone]  If 7PM-7AM, please contact night-coverage [email or web address] Password [email or web address] 11/17/2016, 11:55 AM

## 2016-11-18 DIAGNOSIS — I340 Nonrheumatic mitral (valve) insufficiency: Secondary | ICD-10-CM

## 2016-11-18 DIAGNOSIS — I5043 Acute on chronic combined systolic (congestive) and diastolic (congestive) heart failure: Secondary | ICD-10-CM

## 2016-11-18 LAB — BASIC METABOLIC PANEL
ANION GAP: 10 (ref 5–15)
BUN: 17 mg/dL (ref 6–20)
CO2: 29 mmol/L (ref 22–32)
CREATININE: 1 mg/dL (ref 0.44–1.00)
Calcium: 9.2 mg/dL (ref 8.9–10.3)
Chloride: 102 mmol/L (ref 101–111)
GFR calc Af Amer: >60 mL/min (ref >60)
GFR calc non Af Amer: 58 mL/min — ABNORMAL LOW (ref >60)
GLUCOSE: 98 mg/dL (ref 65–99)
Potassium: 3.7 mmol/L (ref 3.5–5.1)
Sodium: 141 mmol/L (ref 135–145)

## 2016-11-18 LAB — MAGNESIUM: Magnesium: 1.9 mg/dL (ref 1.7–2.4)

## 2016-11-18 MED ORDER — CYCLOBENZAPRINE HCL 5 MG PO TABS
7.5000 mg | ORAL_TABLET | Freq: Three times a day (TID) | ORAL | Status: DC | PRN
  Administered 2016-11-18: 7.5 mg via ORAL
  Filled 2016-11-18 (×2): qty 2

## 2016-11-18 MED ORDER — LOSARTAN POTASSIUM 25 MG PO TABS
25.0000 mg | ORAL_TABLET | Freq: Every day | ORAL | Status: DC
  Administered 2016-11-19: 25 mg via ORAL
  Filled 2016-11-18: qty 1

## 2016-11-18 MED ORDER — BISACODYL 10 MG RE SUPP
10.0000 mg | Freq: Every day | RECTAL | Status: DC | PRN
  Administered 2016-11-21: 10 mg via RECTAL
  Filled 2016-11-18: qty 1

## 2016-11-18 MED ORDER — LOSARTAN POTASSIUM 50 MG PO TABS: 50.0000 mg | ORAL_TABLET | Freq: Every day | ORAL | Status: DC

## 2016-11-18 NOTE — Progress Notes (Signed)
Pt having leg cramps bilaterally. Pt states this happens to her at home as well. MD aware and at bedside. Pt laying back in bed resting comfortably. Will continue to monitor  Jakell Trusty Elige Radon

## 2016-11-18 NOTE — Progress Notes (Signed)
Paged cardiology in regards to pt diet. Cardiology PA stated pt cardiac cath scheduled for 11/21/2016 and may discontinue NPO order  Margaret Vargas

## 2016-11-18 NOTE — Progress Notes (Signed)
Paged MD regarding pt not having BM after miralax, senokot, coffee and ambulation. Pt states she had a small BM yesterday but feels constipated and uncomfortable. Pt requesting suppository, awaiting call back  Margaret Vargas

## 2016-11-18 NOTE — Progress Notes (Signed)
PROGRESS NOTE    Margaret Vargas  KCL:[id number] DOB: 05/17/1951 DOA: 11/16/2016 PCP: Emeterio Reeve, MD  Brief Narrative:Margaret Vargas is a 66 y.o. female with medical history significant for hypertension, history of TIA, and depression with anxiety who presents to the emergency department with 1-2 months of exertional dyspnea and orthopnea with acute worsening over the past several days. Chest x-ray findings are consistent with CHF/volume overload with cardiomegaly and mild interstitial edema as well as small effusions.   Assessment & Plan:    1. Acute Systolic CHF   - Pt presents with 1-2 mos progressive DOE, wt gain, and orthopnea, worsening acutely in recent days - breathing improving , CXR with cardiomegaly, interstitial edema, and small effusions; BNP 673  - continue Lasix 40 mg IV q12h, negative 5L - 2D ECHO with EF 25-30% with severe MR and WMA, needs cath, Cards consulted -monitor I/O, weights -CHF education  2. Hypertension  -stable, at home on HCTZ and candesartan; these were held on admission  -ARB resumed  3. History of TIA  - No acute neurologic s/s  - Continue daily statin and ASA 81    4. Belching/bloating -could be related to fluid overload, recent Abx use -monitor  4. Depression, anxiety  - Appears quite stable  - Continue Lexapro and low-dose prn Xanax  DVT prophylaxis: sq Lovenox Code Status: Full  Family Communication: none at bedside Disposition Plan: home in few days once adequately diuresed  Consultants:   Cards   Subjective: Breathing improving not at baseline yet, severe leg cramps today  Objective: Vitals:   11/18/16 0831 11/18/16 0900 11/18/16 1158 11/18/16 1230  BP:  (!) 144/78 100/82 115/73  Pulse:  93 84 89  Resp:  19 18 18   Temp:  98 F (36.7 C) 97.8 F (36.6 C) 98.9 F (37.2 C)  TempSrc:  Oral Oral Oral  SpO2: 99% 99% 95% 99%  Weight:   94.1 kg (207 lb 7.3 oz)   Height:   5\' 9"  (1.753 m)     Intake/Output  Summary (Last 24 hours) at 11/18/16 1242 Last data filed at 11/18/16 1200  Gross per 24 hour  Intake              840 ml  Output             2850 ml  Net            -2010 ml   Filed Weights   11/17/16 0649 11/18/16 0457 11/18/16 1158  Weight: (!) 138.4 kg (305 lb 3.2 oz) 135.8 kg (299 lb 4.8 oz) 94.1 kg (207 lb 7.3 oz)    Examination:  General exam: Appears calm and comfortable  Respiratory system: few basilar crackles Cardiovascular system: S1 & S2 heard, RRR. No JVD, murmurs, rubs, gallops  Gastrointestinal system: Abdomen is nondistended, soft and nontender. No organomegaly or masses felt. Normal bowel sounds heard. Central nervous system: Alert and oriented. No focal neurological deficits. Extremities: Symmetric 5 x 5 power, 1 plus edema Skin: No rashes, lesions or ulcers Psychiatry: Judgement and insight appear normal. Mood & affect appropriate.     Data Reviewed:   CBC:  Recent Labs Lab 11/12/16 0343 11/16/16 1342  WBC 9.6 9.2  NEUTROABS 6.4  --   HGB 12.0 11.8*  HCT 38.5 37.9  MCV 97.7 96.7  PLT 291 301   Basic Metabolic Panel:  Recent Labs Lab 11/12/16 0343 11/16/16 1342 11/17/16 0356 11/18/16 0359 11/18/16 0830  NA 141 141 143 141  --  K 4.2 4.1 3.8 3.7  --   CL 106 107 105 102  --   CO2 26 25 30 29   --   GLUCOSE 133* 108* 121* 98  --   BUN 14 17 16 17   --   CREATININE 0.90 0.95 1.03* 1.00  --   CALCIUM 9.0 9.4 9.4 9.2  --   MG  --   --   --   --  1.9   GFR: Estimated Creatinine Clearance: 69.4 mL/min (by C-G formula based on SCr of 1 mg/dL). Liver Function Tests:  Recent Labs Lab 11/12/16 0343 11/16/16 1342  AST 28 44*  ALT 31 55*  ALKPHOS 33* 30*  BILITOT 0.8 0.8  PROT 6.5 5.9*  ALBUMIN 4.0 3.9    Recent Labs Lab 11/12/16 0343 11/16/16 1342  LIPASE 63* 23   No results for input(s): AMMONIA in the last 168 hours. Coagulation Profile: No results for input(s): INR, PROTIME in the last 168 hours. Cardiac Enzymes:  Recent  Labs Lab 11/16/16 1703  TROPONINI <0.03   BNP (last 3 results) No results for input(s): PROBNP in the last 8760 hours. HbA1C: No results for input(s): HGBA1C in the last 72 hours. CBG:  Recent Labs Lab 11/16/16 2120  GLUCAP 125*   Lipid Profile: No results for input(s): CHOL, HDL, LDLCALC, TRIG, CHOLHDL, LDLDIRECT in the last 72 hours. Thyroid Function Tests: No results for input(s): TSH, T4TOTAL, FREET4, T3FREE, THYROIDAB in the last 72 hours. Anemia Panel: No results for input(s): VITAMINB12, FOLATE, FERRITIN, TIBC, IRON, RETICCTPCT in the last 72 hours. Urine analysis:    Component Value Date/Time   COLORURINE YELLOW 11/16/2016 1730   APPEARANCEUR HAZY (A) 11/16/2016 1730   LABSPEC 1.024 11/16/2016 1730   PHURINE 6.0 11/16/2016 1730   GLUCOSEU NEGATIVE 11/16/2016 1730   HGBUR NEGATIVE 11/16/2016 1730   BILIRUBINUR NEGATIVE 11/16/2016 1730   KETONESUR NEGATIVE 11/16/2016 1730   PROTEINUR 100 (A) 11/16/2016 1730   UROBILINOGEN 1.0 05/07/2012 1554   NITRITE NEGATIVE 11/16/2016 1730   LEUKOCYTESUR TRACE (A) 11/16/2016 1730   Sepsis Labs: @LABRCNTIP (procalcitonin:4,lacticidven:4)  ) Recent Results (from the past 240 hour(s))  C difficile quick scan w PCR reflex     Status: None   Collection Time: 11/12/16  4:25 AM  Result Value Ref Range Status   C Diff antigen NEGATIVE NEGATIVE Final   C Diff toxin NEGATIVE NEGATIVE Final   C Diff interpretation No C. difficile detected.  Final  Urine culture     Status: Abnormal   Collection Time: 11/12/16  5:24 AM  Result Value Ref Range Status   Specimen Description URINE, CLEAN CATCH  Final   Special Requests NONE  Final   Culture MULTIPLE SPECIES PRESENT, SUGGEST RECOLLECTION (A)  Final   Report Status 11/13/2016 FINAL  Final         Radiology Studies: Dg Chest 2 View  Result Date: 11/16/2016 CLINICAL DATA:  Distended abdomen. Shortness of breath. Fluid weight gain. EXAM: CHEST  2 VIEW COMPARISON:  10/07/2016  FINDINGS: Heart is enlarged. There is pulmonary venous hypertension with mild interstitial edema. Tiny amount of fluid in the pleural space. No collapse or infiltrate. No acute bone finding. IMPRESSION: Congestive heart failure/fluid overload. Enlarged cardiac silhouette. Venous hypertension. Mild interstitial edema and small effusions Electronically Signed   By: Paulina Fusi M.D.   On: 11/16/2016 16:48        Scheduled Meds: . allopurinol  300 mg Oral Daily  . aspirin EC  81 mg  Oral Daily  . cholecalciferol  1,000 Units Oral Daily  . enoxaparin (LOVENOX) injection  60 mg Subcutaneous Q24H  . escitalopram  20 mg Oral Daily  . fluticasone furoate-vilanterol  1 puff Inhalation Daily  . furosemide  40 mg Intravenous Q12H  . [START ON 11/19/2016] losartan  25 mg Oral Daily  . polyethylene glycol  17 g Oral Daily  . potassium chloride  40 mEq Oral BID  . senna-docusate  1 tablet Oral BID  . simvastatin  20 mg Oral QHS  . sodium chloride flush  3 mL Intravenous Q12H  . vitamin B-12  2,500 mcg Oral Daily   Continuous Infusions:   LOS: 2 days    Time spent:44min    Zannie Cove, MD [facility] Pager [phone]  If 7PM-7AM, please contact night-coverage [email or web address] Password [email or web address] 11/18/2016, 12:42 PM

## 2016-11-18 NOTE — Progress Notes (Signed)
Physical Therapy Evaluation & Discharge Patient Details Name: Margaret Vargas MRN: [id number] DOB: Nov 21, 1950 Today's Date: 11/18/2016   History of Present Illness  66 y.o. female with medical history significant for hypertension, history of TIA, and depression with anxiety who presents to the emergency department with 1-2 months of exertional dyspnea and orthopnea with acute worsening over the past several days.  Clinical Impression  Patient walking in hallway on unit on arrival, slower pace without assistive device.  O2 saturation in good range with activity/exertion, patient reports improving now. Patient overall Independent for functional mobility, no balance deficits noted.  Educated patient on self monitoring symptoms of dyspnea, and exercise / activity dosing to increase strength and endurance.  Patient pending catheterization procedure at this time, and may benefit from additional education and monitoring after discharge home from hospital.  No further skilled need noted in hospital, patient can self pace activity with nursing supervision, will DC PT consult, recommend follow up with home health PT.    Follow Up Recommendations Home health PT    Equipment Recommendations  None recommended by PT    Recommendations for Other Services       Precautions / Restrictions Restrictions Weight Bearing Restrictions: No      Mobility  Bed Mobility Overal bed mobility: Independent                Transfers Overall transfer level: Independent                  Ambulation/Gait Ambulation/Gait assistance: Independent Ambulation Distance (Feet): 200 Feet Assistive device: None Gait Pattern/deviations: WFL(Within Functional Limits) Gait velocity: Decreased, self paced Gait velocity interpretation: Below normal speed for age/gender    Stairs            Wheelchair Mobility    Modified Rankin (Stroke Patients Only)       Balance Overall balance assessment: No  apparent balance deficits (not formally assessed)                                           Pertinent Vitals/Pain Pain Assessment: No/denies pain (Mild chest tightness with activity, relieved by rest)    Home Living Family/patient expects to be discharged to:: Private residence Living Arrangements: Spouse/significant other Available Help at Discharge: Family Type of Home: House Home Access: Level entry     Home Layout: Two level;Able to live on main level with bedroom/bathroom        Prior Function Level of Independence: Independent               Hand Dominance        Extremity/Trunk Assessment   Upper Extremity Assessment Upper Extremity Assessment: Overall WFL for tasks assessed    Lower Extremity Assessment Lower Extremity Assessment: Overall WFL for tasks assessed    Cervical / Trunk Assessment Cervical / Trunk Assessment: Normal  Communication   Communication: No difficulties  Cognition Arousal/Alertness: Awake/alert Behavior During Therapy: WFL for tasks assessed/performed Overall Cognitive Status: Within Functional Limits for tasks assessed                                        General Comments      Exercises     Assessment/Plan    PT Assessment All further PT needs can be met  in the next venue of care  PT Problem List Decreased activity tolerance;Decreased knowledge of precautions;Cardiopulmonary status limiting activity       PT Treatment Interventions      PT Goals (Current goals can be found in the Care Plan section)  Acute Rehab PT Goals Patient Stated Goal: Get strength and endurance back PT Goal Formulation: All assessment and education complete, DC therapy    Frequency     Barriers to discharge        Co-evaluation               End of Session   Activity Tolerance: Patient tolerated treatment well Patient left: in chair Nurse Communication: Mobility status PT Visit Diagnosis:  Muscle weakness (generalized) (M62.81)    Time: 4834-7583 PT Time Calculation (min) (ACUTE ONLY): 20 min   Charges:   PT Evaluation $PT Eval Low Complexity: 1 Procedure     PT G CodesJudith Blonder, DPT  Zenia Resides, Raphel Stickles L 11/18/2016, 10:09 AM

## 2016-11-18 NOTE — Progress Notes (Signed)
Pt ambulated in hallway, 300 feet on room air, no assistive devices. Pt tolerated well  Margaret Vargas

## 2016-11-18 NOTE — Care Management Note (Signed)
Case Management Note  Patient Details  Name: Addylin Accetta MRN: [id number] Date of Birth: 07-12-1951  Subjective/Objective:         Admitted with New CHF          Action/Plan: Patient lives at home with her spouse ( a professor at [employer]); PCP is Dr Paulino Rily; has private insurance with [employer] with prescription drug coverage; pharmacy of choice is [facility]; also she use [facility]; patient reports no problem getting her medication; she is independent of all of her ADL's and does not qualify for any HHC, no DME, continues to drive; her spouse does all of the cooking and would like a Dietitian to talk to her and her spouse about food choices and seasoning. No needs identified at this time; CM will continue to follow for DCP.  Expected Discharge Date:     Possibly 11/19/2016             Expected Discharge Plan:  Home/Self Care  In-House Referral:   Dietitian  Discharge planning Services  CM Consult  Status of Service:  In process, will continue to follow  Reola Mosher [phone] 11/18/2016, 11:09 AM

## 2016-11-18 NOTE — Consult Note (Signed)
Primary Physician: Primary Cardiologist:  New    Asked to see by IM service for CHF  HPI: Pt is a 66 yo who was admitted on 4/418 for dyspnea. Pt has a history ofHTN (controlled by her report), TIA.   Diagnosed with asthma 1 year ago   Pt says that beginning last summer she had noticed increased SOB with activity  [place] a [place]  Had to stop intermitt last summer because of SOB and chet pressure  This continued through the summer  This winter she had signif bronchitis with SOB  She did nto feel like she recovered  Had to sleep in recliner because couldn't lie flat  Also had coughing  This week things really got bad with breathing  Came to ED on 4/4 Since admit she has been treated with LV lasix with 5 L of net urine output.  Echo done LVEF 25 to 30% with severe MR  Pt still coughs when she lays down in bed  Getting better   Ankles improved  Almost at baseline      Past Medical History:  Diagnosis Date  . Acute CHF (congestive heart failure) (HCC) 11/16/2016   [name] 11/16/2016  . Anxiety    [name] 11/16/2016  . Arthritis    "neck" (11/16/2016)  . Asthma   . Chronic bronchitis (HCC)   . Chronic neck pain   . Depression    [name] 11/16/2016  . Gout   . 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 Heart murmur    "when I was a child"  . High cholesterol   . History of blood transfusion    "w/a back OR"  . Hypertension    [name] 11/16/2016  . Pre-diabetes   . Sleep apnea    "need a mask/tests" (11/16/2016)  . Stroke [facility]) 05/2008   "mini stroke"; denies residual on 11/16/2016  . Thyroid nodule   . TIA (transient ischemic attack) "several"   Rheumatic fever as child  Medications Prior to Admission  Medication Sig Dispense Refill  . albuterol (PROVENTIL HFA;VENTOLIN HFA) 108 (90 Base) MCG/ACT inhaler Inhale 2 puffs into the lungs every 6 (six) hours as needed for wheezing or shortness of breath.    . allopurinol (ZYLOPRIM) 300 MG tablet  Take 300 mg by mouth daily.    [name] Kitchen ALPRAZolam (XANAX) 0.25 MG tablet Take 0.125 mg by mouth at bedtime as needed for anxiety.    [name] Kitchen aspirin EC 81 MG tablet Take 81 mg by mouth every morning.     . candesartan (ATACAND) 32 MG tablet Take 32 mg by mouth daily.    . cholecalciferol (VITAMIN D) 1000 UNITS tablet Take 1,000 Units by mouth daily.    . colchicine 0.6 MG tablet Take 0.6 mg by mouth daily as needed (FLAREUPS).    . Cyanocobalamin (VITAMIN B-12) 2500 MCG SUBL Place 2,500 mcg under the tongue daily.    [name] Kitchen escitalopram (LEXAPRO) 20 MG tablet Take 20 mg by mouth every morning.     . fluticasone (FLONASE) 50 MCG/ACT nasal spray Place 1 spray into both nostrils daily as needed for allergies or rhinitis.    . fluticasone furoate-vilanterol (BREO ELLIPTA) 200-25 MCG/INH AEPB Inhale 1 puff into the lungs daily.    . hydrochlorothiazide (HYDRODIURIL) 25 MG tablet Take 25 mg by mouth daily.    [name] Kitchen ibuprofen (ADVIL,MOTRIN) 200 MG tablet Take 400 mg by mouth 2 (two) times daily  as needed (pain).    [name] Kitchen levalbuterol (XOPENEX) 0.63 MG/3ML nebulizer solution Take 0.63 mg by nebulization every 4 (four) hours as needed for wheezing or shortness of breath.    . Propylene Glycol (SYSTANE BALANCE) 0.6 % SOLN Place 1 drop into both eyes 2 (two) times daily as needed (DRY EYES).     [name] Kitchen simvastatin (ZOCOR) 20 MG tablet Take 20 mg by mouth at bedtime.        [name] Kitchen allopurinol  300 mg Oral Daily  . aspirin EC  81 mg Oral Daily  . cholecalciferol  1,000 Units Oral Daily  . enoxaparin (LOVENOX) injection  60 mg Subcutaneous Q24H  . escitalopram  20 mg Oral Daily  . fluticasone furoate-vilanterol  1 puff Inhalation Daily  . furosemide  40 mg Intravenous Q12H  . polyethylene glycol  17 g Oral Daily  . potassium chloride  40 mEq Oral BID  . senna-docusate  1 tablet Oral BID  . simvastatin  20 mg Oral QHS  . sodium chloride flush  3 mL Intravenous Q12H  . vitamin B-12  2,500 mcg Oral Daily    Infusions:   Allergies    Allergen Reactions  . Other Other (See Comments)    Bandaids and some adhesives cause red skin. (pls use paper tape)  . Adhesive [Tape] Itching and Rash  . Morphine And Related Itching    Social History   Social History  . Marital status: Married    Spouse name: N/A  . Number of children: N/A  . Years of education: N/A   Occupational History  . Not on file.   Social History Main Topics  . Smoking status: Former Smoker    Packs/day: 0.75    Years: 13.00    Types: Cigarettes    Quit date: 1989  . Smokeless tobacco: Never Used  . Alcohol use Yes     Comment: 11/16/2016 "might have 3-4 drinks/month"  . Drug use: No     Comment: 11/16/2016 "I smoked marijuana in college"  . Sexual activity: Not on file   Other Topics Concern  . Not on file   Social History Narrative  . No narrative on file    Family History  Problem Relation Age of Onset  . Hypertension Mother     REVIEW OF SYSTEMS:  All systems reviewed  Negative to the above problem except as noted above.    PHYSICAL EXAM: Vitals:   11/18/16 0535 11/18/16 0900  BP: 134/87 (!) 144/78  Pulse: 88 93  Resp: 18 19  Temp: 97.8 F (36.6 C) 98 F (36.7 C)     Intake/Output Summary (Last 24 hours) at 11/18/16 1017 Last data filed at 11/18/16 0800  Gross per 24 hour  Intake              360 ml  Output             3950 ml  Net            -3590 ml    General:  Obese 66 yo in NAD   Does cough a lott HEENT: normal Neck: supple.Thyroid prominent  Carotids 2+ bilat; no bruits.JVP appears elevated   Cor: PMI nondisplaced. Regular rate & rhythm. Distant heart sounds  No definite murmurs Lungs: clear Abdomen: soft, nontender, nondistended. No hepatosplenomegaly. No bruits or masses. Good bowel sounds. Extremities: no cyanosis, clubbing, rash,   Tr edema Neuro: alert & oriented x 3, cranial nerves grossly intact. moves all 4 extremities  w/o difficulty. Affect pleasant.  ECG:  Sinus tachycardia  104 bpm   LBBB  QRS 160  msec    Results for orders placed or performed during the hospital encounter of 11/16/16 (from the past 24 hour(s))  Basic metabolic panel     Status: Abnormal   Collection Time: 11/18/16  3:59 AM  Result Value Ref Range   Sodium 141 135 - 145 mmol/L   Potassium 3.7 3.5 - 5.1 mmol/L   Chloride 102 101 - 111 mmol/L   CO2 29 22 - 32 mmol/L   Glucose, Bld 98 65 - 99 mg/dL   BUN 17 6 - 20 mg/dL   Creatinine, Ser 1.61 0.44 - 1.00 mg/dL   Calcium 9.2 8.9 - 09.6 mg/dL   GFR calc non Af Amer 58 (L) >60 mL/min   GFR calc Af Amer >60 >60 mL/min   Anion gap 10 5 - 15  Magnesium     Status: None   Collection Time: 11/18/16  8:30 AM  Result Value Ref Range   Magnesium 1.9 1.7 - 2.4 mg/dL   Dg Chest 2 View  Result Date: 11/16/2016 CLINICAL DATA:  Distended abdomen. Shortness of breath. Fluid weight gain. EXAM: CHEST  2 VIEW COMPARISON:  10/07/2016 FINDINGS: Heart is enlarged. There is pulmonary venous hypertension with mild interstitial edema. Tiny amount of fluid in the pleural space. No collapse or infiltrate. No acute bone finding. IMPRESSION: Congestive heart failure/fluid overload. Enlarged cardiac silhouette. Venous hypertension. Mild interstitial edema and small effusions Electronically Signed   By: Paulina Fusi M.D.   On: 11/16/2016 16:48     ASSESSMENT:  1  Acute systolic CHF   Pt a 66 yo with about 1 year history of SOB / chest rpssure with exertion  Significantly worse over past week before admission.  Admitted on 4/4  Found to be in heart failure  She is diuresing with IV lasix and is feeling some better  Still coughs in bed, is just getting comfortable with lying flatter Echo done shows LV is signif dilated  Tehre is severe LV dysfunction with akinesis of the inferoseptum; hypokinesis elsewhere.  MR is present (central)  Appears severe.   I have reviewed above with the pt  Recomm continued diuresis  Pt still not good with laying in bed. She says she is really respondiong to current  dose of lasix.  Follow I/O , BUN/Cr.   Needs R and  L heart catheterization to define anatomy/ pressures  WIll sched for Monday  Further plans will be based on these results   Would begin Cozaar for now.    2  Mitral regurgitation  Continue diuresis.  Cath to define anatomy and pressures   3  Lipids  Pt has evid of some atherosclerosis of aorta  Should be on statin  Get lipids in AM    4  HTN  Follow on meds    Dietrich Pates

## 2016-11-18 NOTE — Progress Notes (Signed)
Paged MD regarding pt NPO order, no procedural orders. MD stated to keep pt NPO until cardiology informs if cardiac cath today or Monday. Will continue to monitor  Margaret Vargas

## 2016-11-18 NOTE — Plan of Care (Signed)
Problem: Food- and Nutrition-Related Knowledge Deficit (NB-1.1) Goal: Nutrition education Formal process to instruct or train a patient/client in a skill or to impart knowledge to help patients/clients voluntarily manage or modify food choices and eating behavior to maintain or improve health. Outcome: Completed/Met Date Met: 11/18/16  RD consulted for nutrition education regarding a Heart Healthy diet.   RD provided "Heart Healthy Nutrition Therapy" handout from the [employer]. Reviewed patient's dietary recall. Provided examples on ways to decrease sodium and fat intake in diet. Discouraged intake of processed foods and use of salt shaker. Encouraged fresh fruits and vegetables as well as whole grain sources of carbohydrates to maximize fiber intake. Teach back method used.  Expect fair compliance.  Body mass index is 43.57 kg/m. Pt meets criteria for Obesity Class III based on current BMI.  Current diet order is Heart Healthy, patient is consuming approximately 100% of meals at this time.   Nutrition focused physical exam completed.  No muscle or subcutaneous fat depletion noticed.  Labs and medications reviewed.   No further nutrition interventions warranted at this time.   If additional nutrition issues arise, please re-consult RD.  Arthur Holms, RD, LDN Pager #: [phone] After-Hours Pager #: [phone]

## 2016-11-18 NOTE — Progress Notes (Signed)
Patient with no complaints or concerns during 7pm - 7am shift. Slept during the night.   Lalia Loudon, RN 

## 2016-11-19 DIAGNOSIS — I472 Ventricular tachycardia: Secondary | ICD-10-CM

## 2016-11-19 DIAGNOSIS — I5021 Acute systolic (congestive) heart failure: Secondary | ICD-10-CM

## 2016-11-19 DIAGNOSIS — I4729 Other ventricular tachycardia: Secondary | ICD-10-CM

## 2016-11-19 DIAGNOSIS — I059 Rheumatic mitral valve disease, unspecified: Secondary | ICD-10-CM

## 2016-11-19 LAB — BASIC METABOLIC PANEL
Anion gap: 9 (ref 5–15)
BUN: 17 mg/dL (ref 6–20)
CHLORIDE: 101 mmol/L (ref 101–111)
CO2: 30 mmol/L (ref 22–32)
Calcium: 9.5 mg/dL (ref 8.9–10.3)
Creatinine, Ser: 0.98 mg/dL (ref 0.44–1.00)
GFR calc non Af Amer: 60 mL/min — ABNORMAL LOW (ref >60)
Glucose, Bld: 108 mg/dL — ABNORMAL HIGH (ref 65–99)
POTASSIUM: 3.9 mmol/L (ref 3.5–5.1)
SODIUM: 140 mmol/L (ref 135–145)

## 2016-11-19 MED ORDER — SENNOSIDES-DOCUSATE SODIUM 8.6-50 MG PO TABS
1.0000 | ORAL_TABLET | Freq: Every evening | ORAL | Status: DC | PRN
  Filled 2016-11-19: qty 1

## 2016-11-19 MED ORDER — POLYETHYLENE GLYCOL 3350 17 G PO PACK
17.0000 g | PACK | Freq: Every day | ORAL | Status: DC | PRN
  Administered 2016-11-19 – 2016-11-22 (×4): 17 g via ORAL
  Filled 2016-11-19 (×4): qty 1

## 2016-11-19 MED ORDER — METOPROLOL SUCCINATE ER 25 MG PO TB24
12.5000 mg | ORAL_TABLET | Freq: Every day | ORAL | Status: DC
  Administered 2016-11-19 – 2016-11-23 (×5): 12.5 mg via ORAL
  Filled 2016-11-19 (×5): qty 1

## 2016-11-19 NOTE — Progress Notes (Signed)
Stopped MD in hallway to inform of pt continuing to have leg cramps. MD stated pt electrolytes are within normal range. No new orders at this time. Will continue to monitor  [name] Elige Radon

## 2016-11-19 NOTE — Progress Notes (Signed)
PROGRESS NOTE    Margaret Vargas  WUJ:[id number] DOB: 11-Jun-1951 DOA: 11/16/2016 PCP: Emeterio Reeve, MD  Brief Narrative:Margaret Vargas is a 66 y.o. female with medical history significant for hypertension, history of TIA, and depression with anxiety who presents to the emergency department with 1-2 months of exertional dyspnea and orthopnea with acute worsening over the past several days. Chest x-ray findings are consistent with CHF/volume overload with cardiomegaly and mild interstitial edema as well as small effusions. Improving with diyuresis, ECHo with Severe MR and WMA and EF 25-30%  Assessment & Plan:   1. Acute Systolic CHF   -Pt presents with 1-2 mos progressive DOE, wt gain, and orthopnea, worsening acutely in recent days -breathing improving , CXR with cardiomegaly, interstitial edema, and small effusions; BNP 673  -continue Lasix 40 mg IV q12h, negative 7.3L -2D ECHO with EF 25-30% with severe MR and WMA, needs cath, Cards consulting, planned for Monday -monitor I/O, weights -CHF education  2. Severe MR -noted on ECHO, pt reports remote h/o Rheumatic fever as a child -define anatomy on Cath Monday  3. Hypertension  -stable, at home on HCTZ and candesartan; these were held on admission  -ARB resumed  3. History of TIA  - No acute neurologic s/s  - Continue daily statin and ASA 81    4. Belching/bloating -could be related to fluid overload, recent Abx use -improved  4. Depression, anxiety  - Appears quite stable  - Continue Lexapro and low-dose prn Xanax  DVT prophylaxis: sq Lovenox Code Status: Full  Family Communication: none at bedside Disposition Plan: home in few days once adequately diuresed  Consultants:   Cards   Subjective: Breathing improving not at baseline yet, leg cramps off and on   Objective: Vitals:   11/18/16 2030 11/19/16 0607 11/19/16 0908 11/19/16 0932  BP: 128/72 125/78 132/71   Pulse: 84 78 88   Resp: 20 18 18      Temp: 98.1 F (36.7 C) 97.9 F (36.6 C)    TempSrc: Oral Oral    SpO2: 99% 99% 100% 100%  Weight:  133.1 kg (293 lb 8 oz)    Height:        Intake/Output Summary (Last 24 hours) at 11/19/16 1046 Last data filed at 11/19/16 0900  Gross per 24 hour  Intake             1440 ml  Output             4700 ml  Net            -3260 ml   Filed Weights   11/17/16 0649 11/18/16 0457 11/19/16 0607  Weight: (!) 138.4 kg (305 lb 3.2 oz) 135.8 kg (299 lb 4.8 oz) 133.1 kg (293 lb 8 oz)    Examination:  General exam: Appears calm and comfortable  Respiratory system: few basilar crackles Cardiovascular system: S1 & S2 heard, RRR. No JVD, murmurs, rubs, gallops  Gastrointestinal system: Abdomen is nondistended, soft and nontender. No organomegaly or masses felt. Normal bowel sounds heard. Central nervous system: Alert and oriented. No focal neurological deficits. Extremities: Symmetric 5 x 5 power, 1 plus edema Skin: No rashes, lesions or ulcers Psychiatry: Judgement and insight appear normal. Mood & affect appropriate.     Data Reviewed:   CBC:  Recent Labs Lab 11/16/16 1342  WBC 9.2  HGB 11.8*  HCT 37.9  MCV 96.7  PLT 301   Basic Metabolic Panel:  Recent Labs Lab 11/16/16 1342 11/17/16 0356 11/18/16  1610 11/18/16 0830 11/19/16 0312  NA 141 143 141  --  140  K 4.1 3.8 3.7  --  3.9  CL 107 105 102  --  101  CO2 25 30 29   --  30  GLUCOSE 108* 121* 98  --  108*  BUN 17 16 17   --  17  CREATININE 0.95 1.03* 1.00  --  0.98  CALCIUM 9.4 9.4 9.2  --  9.5  MG  --   --   --  1.9  --    GFR: Estimated Creatinine Clearance: 85.8 mL/min (by C-G formula based on SCr of 0.98 mg/dL). Liver Function Tests:  Recent Labs Lab 11/16/16 1342  AST 44*  ALT 55*  ALKPHOS 30*  BILITOT 0.8  PROT 5.9*  ALBUMIN 3.9    Recent Labs Lab 11/16/16 1342  LIPASE 23   No results for input(s): AMMONIA in the last 168 hours. Coagulation Profile: No results for input(s): INR, PROTIME  in the last 168 hours. Cardiac Enzymes:  Recent Labs Lab 11/16/16 1703  TROPONINI <0.03   BNP (last 3 results) No results for input(s): PROBNP in the last 8760 hours. HbA1C: No results for input(s): HGBA1C in the last 72 hours. CBG:  Recent Labs Lab 11/16/16 2120  GLUCAP 125*   Lipid Profile: No results for input(s): CHOL, HDL, LDLCALC, TRIG, CHOLHDL, LDLDIRECT in the last 72 hours. Thyroid Function Tests: No results for input(s): TSH, T4TOTAL, FREET4, T3FREE, THYROIDAB in the last 72 hours. Anemia Panel: No results for input(s): VITAMINB12, FOLATE, FERRITIN, TIBC, IRON, RETICCTPCT in the last 72 hours. Urine analysis:    Component Value Date/Time   COLORURINE YELLOW 11/16/2016 1730   APPEARANCEUR HAZY (A) 11/16/2016 1730   LABSPEC 1.024 11/16/2016 1730   PHURINE 6.0 11/16/2016 1730   GLUCOSEU NEGATIVE 11/16/2016 1730   HGBUR NEGATIVE 11/16/2016 1730   BILIRUBINUR NEGATIVE 11/16/2016 1730   KETONESUR NEGATIVE 11/16/2016 1730   PROTEINUR 100 (A) 11/16/2016 1730   UROBILINOGEN 1.0 05/07/2012 1554   NITRITE NEGATIVE 11/16/2016 1730   LEUKOCYTESUR TRACE (A) 11/16/2016 1730   Sepsis Labs: @LABRCNTIP (procalcitonin:4,lacticidven:4)  ) Recent Results (from the past 240 hour(s))  C difficile quick scan w PCR reflex     Status: None   Collection Time: 11/12/16  4:25 AM  Result Value Ref Range Status   C Diff antigen NEGATIVE NEGATIVE Final   C Diff toxin NEGATIVE NEGATIVE Final   C Diff interpretation No C. difficile detected.  Final  Urine culture     Status: Abnormal   Collection Time: 11/12/16  5:24 AM  Result Value Ref Range Status   Specimen Description URINE, CLEAN CATCH  Final   Special Requests NONE  Final   Culture MULTIPLE SPECIES PRESENT, SUGGEST RECOLLECTION (A)  Final   Report Status 11/13/2016 FINAL  Final         Radiology Studies: No results found.      Scheduled Meds: . allopurinol  300 mg Oral Daily  . aspirin EC  81 mg Oral Daily  .  cholecalciferol  1,000 Units Oral Daily  . enoxaparin (LOVENOX) injection  60 mg Subcutaneous Q24H  . escitalopram  20 mg Oral Daily  . fluticasone furoate-vilanterol  1 puff Inhalation Daily  . furosemide  40 mg Intravenous Q12H  . losartan  25 mg Oral Daily  . metoprolol succinate  12.5 mg Oral Daily  . potassium chloride  40 mEq Oral BID  . simvastatin  20 mg Oral QHS  .  sodium chloride flush  3 mL Intravenous Q12H  . vitamin B-12  2,500 mcg Oral Daily   Continuous Infusions:   LOS: 3 days    Time spent:61min    Zannie Cove, MD [facility] Pager [phone]  If 7PM-7AM, please contact night-coverage [email or web address] Password [facility] 11/19/2016, 10:46 AM

## 2016-11-19 NOTE — Progress Notes (Signed)
Pt concerned for new medication metoprolol, stated she took it once before and passed out. Stopped MD Delton See in hallway to inform, MD stated she will talk to pt. MD at bedside now  Ercia Crisafulli Elige Radon

## 2016-11-19 NOTE — Progress Notes (Addendum)
Patient Name: Margaret Vargas Date of Encounter: 11/19/2016  Principal Problem:   Acute CHF (congestive heart failure) (HCC) Active Problems:   History of transient ischemic attack (TIA)   Essential hypertension   Depression   SOB (shortness of breath)   Acute on chronic combined systolic and diastolic CHF (congestive heart failure) (HCC)   Mitral valve insufficiency   Length of Stay: 3  SUBJECTIVE  The patient is feeling better, improved SOB.   CURRENT MEDS . allopurinol  300 mg Oral Daily  . aspirin EC  81 mg Oral Daily  . cholecalciferol  1,000 Units Oral Daily  . enoxaparin (LOVENOX) injection  60 mg Subcutaneous Q24H  . escitalopram  20 mg Oral Daily  . fluticasone furoate-vilanterol  1 puff Inhalation Daily  . furosemide  40 mg Intravenous Q12H  . losartan  25 mg Oral Daily  . potassium chloride  40 mEq Oral BID  . simvastatin  20 mg Oral QHS  . sodium chloride flush  3 mL Intravenous Q12H  . vitamin B-12  2,500 mcg Oral Daily    OBJECTIVE  Vitals:   11/18/16 1230 11/18/16 1710 11/18/16 2030 11/19/16 0607  BP: 115/73 126/79 128/72 125/78  Pulse: 89 92 84 78  Resp: 18 19 20 18   Temp: 98.9 F (37.2 C)  98.1 F (36.7 C) 97.9 F (36.6 C)  TempSrc: Oral  Oral Oral  SpO2: 99%  99% 99%  Weight:    293 lb 8 oz (133.1 kg)  Height:        Intake/Output Summary (Last 24 hours) at 11/19/16 0906 Last data filed at 11/19/16 0800  Gross per 24 hour  Intake             1200 ml  Output             4700 ml  Net            -3500 ml   Filed Weights   11/17/16 0649 11/18/16 0457 11/19/16 0607  Weight: (!) 305 lb 3.2 oz (138.4 kg) 299 lb 4.8 oz (135.8 kg) 293 lb 8 oz (133.1 kg)    PHYSICAL EXAM  General: Pleasant, NAD, obese. Neuro: Alert and oriented X 3. Moves all extremities spontaneously. Psych: Normal affect. HEENT:  Normal  Neck: Supple without bruits or JVD. Lungs:  Resp regular and unlabored, CTA. Heart: RRR no s3, s4, or murmurs. Abdomen: Soft,  non-tender, non-distended, BS + x 4.  Extremities: No clubbing, cyanosis , mild edema. DP/PT/Radials 2+ and equal bilaterally.  Accessory Clinical Findings  CBC  Recent Labs  11/16/16 1342  WBC 9.2  HGB 11.8*  HCT 37.9  MCV 96.7  PLT 301   Basic Metabolic Panel  Recent Labs  11/18/16 0359 11/18/16 0830 11/19/16 0312  NA 141  --  140  K 3.7  --  3.9  CL 102  --  101  CO2 29  --  30  GLUCOSE 98  --  108*  BUN 17  --  17  CREATININE 1.00  --  0.98  CALCIUM 9.2  --  9.5  MG  --  1.9  --    Liver Function Tests  Recent Labs  11/16/16 1342  AST 44*  ALT 55*  ALKPHOS 30*  BILITOT 0.8  PROT 5.9*  ALBUMIN 3.9    Recent Labs  11/16/16 1342  LIPASE 23   Cardiac Enzymes  Recent Labs  11/16/16 1703  TROPONINI <0.03   Radiology/Studies  Dg  Chest 2 View  Result Date: 11/16/2016 CLINICAL DATA:  Distended abdomen. Shortness of breath. Fluid weight gain.  IMPRESSION: Congestive heart failure/fluid overload. Enlarged cardiac silhouette. Venous hypertension. Mild interstitial edema and small effusions Electronically Signed     Ct Abdomen Pelvis W Contrast  Result Date: 11/12/2016 CLINICAL DATA:  Epigastric pain and diarrhea. History of hysterectomy and right oophorectomy  IMPRESSION: Two cystic structures in the pelvis measuring 41 x 48 mm, and 46 mm in diameter. Small amount of free fluid in the pelvis and around the liver. Possible left ovary cystic rupture or hemorrhage. Normal appendix Small right pleural effusion Cardiac enlargement Probable fatty infiltration of the liver.  TELE: SR  TTE: 11/17/2016 - Left ventricle: The cavity size was mildly dilated. Wall   thickness was increased in a pattern of mild LVH. Systolic   function was severely reduced. The estimated ejection fraction   was in the range of 25% to 30%. Dyskinesis of the   basal-midanteroseptal and anterior myocardium. Moderate   hypokinesis of the lateral and inferior myocardium. - Aortic valve:  There was trivial regurgitation. - Mitral valve: Mildly calcified annulus. There was severe   regurgitation. - Left atrium: The atrium was moderately to severely dilated. - Right ventricle: The cavity size was mildly dilated. Wall   thickness was normal. - Right atrium: The atrium was dilated. - Tricuspid valve: There was moderate regurgitation. - Pulmonary arteries: Systolic pressure was moderately increased.   PA peak pressure: 64 mm Hg (S).    ASSESSMENT AND PLAN  1  Acute systolic CHF   Pt a 66 yo with about 1 year history of SOB / chest rpssure with exertion  Significantly worse over past week before admission.  Admitted on 4/4  Found to be in heart failure . LVEF 25-30% with regional WMA. Great diuresis, - 3.5 L overnight and down 12 lbs in 2 days.  Left and right cath scheduled for 11/21/2016. Add Toprol XL 12.5 mg po daily.  Recomm continued diuresis, BUN/Cr, K stable.    2  Mitral regurgitation  Continue diuresis.  Cath to define anatomy and pressures   3  Lipids  Pt has evid of some atherosclerosis of aorta  Should be on statin  Get lipids in AM    4  HTN  Follow on meds    5. nsVT - 6 beats asymptomatic, add Toprol XL 12.5 mg po daily   Signed, Tobias Alexander MD, [facility] 11/19/2016

## 2016-11-19 NOTE — Progress Notes (Signed)
Pt requesting to shower, MD Delton See at bedside stated pt is stable to do so. Will place verbal order.  Margaret Vargas

## 2016-11-20 LAB — BASIC METABOLIC PANEL
Anion gap: 7 (ref 5–15)
BUN: 22 mg/dL — AB (ref 6–20)
CHLORIDE: 101 mmol/L (ref 101–111)
CO2: 31 mmol/L (ref 22–32)
CREATININE: 1.14 mg/dL — AB (ref 0.44–1.00)
Calcium: 9.4 mg/dL (ref 8.9–10.3)
GFR calc Af Amer: 58 mL/min — ABNORMAL LOW (ref >60)
GFR calc non Af Amer: 50 mL/min — ABNORMAL LOW (ref >60)
Glucose, Bld: 134 mg/dL — ABNORMAL HIGH (ref 65–99)
Potassium: 4.6 mmol/L (ref 3.5–5.1)
Sodium: 139 mmol/L (ref 135–145)

## 2016-11-20 NOTE — Progress Notes (Signed)
PROGRESS NOTE    Margaret Vargas  ZOX:[id number] DOB: 1950-11-04 DOA: 11/16/2016 PCP: Emeterio Reeve, MD  Brief Narrative:Margaret Vargas is a 66 y.o. female with medical history significant for hypertension, history of TIA, and depression with anxiety who presents to the emergency department with 1-2 months of exertional dyspnea and orthopnea with acute worsening over the past several days. Chest x-ray findings are consistent with CHF/volume overload with cardiomegaly and mild interstitial edema as well as small effusions. Improving with diyuresis, ECHo with Severe MR and WMA and EF 25-30%  Assessment & Plan:   1. Acute Systolic CHF   -Pt presents with 1-2 mos progressive DOE, wt gain, and orthopnea, worsening acutely in recent days -breathing improving , CXR with cardiomegaly, interstitial edema, and small effusions; BNP 673  -negative 9.6L -2D ECHO with EF 25-30% with severe MR and WMA, needs cath, Cards consulting, planned for Monday -hold lasix/ARB due to drop in BP, resume PO tomorrow if stable -CHF education  2. Severe MR -noted on ECHO, pt reports remote h/o Rheumatic fever as a child -define anatomy on Cath Monday  3. Hypertension  -stable, at home on HCTZ and candesartan; these were held on admission  -ARB held due to drop in BP to 90s  3. History of TIA  - No acute neurologic s/s  - Continue daily statin and ASA 81    4. Belching/bloating -could be related to fluid overload, recent Abx use -improved  4. Depression, anxiety  - Appears quite stable  - Continue Lexapro and low-dose prn Xanax  DVT prophylaxis: sq Lovenox Code Status: Full  Family Communication: none at bedside Disposition Plan: home in 1-2days  Consultants:   Cards   Subjective: Breathing improving not at baseline yet, leg cramps better  Objective: Vitals:   11/19/16 2004 11/20/16 0617 11/20/16 0900 11/20/16 0934  BP: 128/73 98/65 (!) 113/54   Pulse: 80 74 80   Resp: 18       Temp: 98 F (36.7 C) 97.5 F (36.4 C)    TempSrc: Oral Oral    SpO2: 97% 95%  96%  Weight:  133.5 kg (294 lb 6.4 oz)    Height:        Intake/Output Summary (Last 24 hours) at 11/20/16 1246 Last data filed at 11/20/16 1000  Gross per 24 hour  Intake              600 ml  Output             2700 ml  Net            -2100 ml   Filed Weights   11/18/16 0457 11/19/16 0607 11/20/16 0617  Weight: 135.8 kg (299 lb 4.8 oz) 133.1 kg (293 lb 8 oz) 133.5 kg (294 lb 6.4 oz)    Examination:  General exam: Appears calm and comfortable  Respiratory system: few basilar crackles Cardiovascular system: S1 & S2 heard, RRR. No JVD, murmurs, rubs, gallops  Gastrointestinal system: Abdomen is nondistended, soft and nontender. No organomegaly or masses felt. Normal bowel sounds heard. Central nervous system: Alert and oriented. No focal neurological deficits. Extremities: Symmetric 5 x 5 power, 1 plus edema Skin: No rashes, lesions or ulcers Psychiatry: Judgement and insight appear normal. Mood & affect appropriate.     Data Reviewed:   CBC:  Recent Labs Lab 11/16/16 1342  WBC 9.2  HGB 11.8*  HCT 37.9  MCV 96.7  PLT 301   Basic Metabolic Panel:  Recent Labs Lab 11/16/16  1342 11/17/16 0356 11/18/16 0359 11/18/16 0830 11/19/16 0312 11/20/16 0152  NA 141 143 141  --  140 139  K 4.1 3.8 3.7  --  3.9 4.6  CL 107 105 102  --  101 101  CO2 25 30 29   --  30 31  GLUCOSE 108* 121* 98  --  108* 134*  BUN 17 16 17   --  17 22*  CREATININE 0.95 1.03* 1.00  --  0.98 1.14*  CALCIUM 9.4 9.4 9.2  --  9.5 9.4  MG  --   --   --  1.9  --   --    GFR: Estimated Creatinine Clearance: 73.8 mL/min (A) (by C-G formula based on SCr of 1.14 mg/dL (H)). Liver Function Tests:  Recent Labs Lab 11/16/16 1342  AST 44*  ALT 55*  ALKPHOS 30*  BILITOT 0.8  PROT 5.9*  ALBUMIN 3.9    Recent Labs Lab 11/16/16 1342  LIPASE 23   No results for input(s): AMMONIA in the last 168  hours. Coagulation Profile: No results for input(s): INR, PROTIME in the last 168 hours. Cardiac Enzymes:  Recent Labs Lab 11/16/16 1703  TROPONINI <0.03   BNP (last 3 results) No results for input(s): PROBNP in the last 8760 hours. HbA1C: No results for input(s): HGBA1C in the last 72 hours. CBG:  Recent Labs Lab 11/16/16 2120  GLUCAP 125*   Lipid Profile: No results for input(s): CHOL, HDL, LDLCALC, TRIG, CHOLHDL, LDLDIRECT in the last 72 hours. Thyroid Function Tests: No results for input(s): TSH, T4TOTAL, FREET4, T3FREE, THYROIDAB in the last 72 hours. Anemia Panel: No results for input(s): VITAMINB12, FOLATE, FERRITIN, TIBC, IRON, RETICCTPCT in the last 72 hours. Urine analysis:    Component Value Date/Time   COLORURINE YELLOW 11/16/2016 1730   APPEARANCEUR HAZY (A) 11/16/2016 1730   LABSPEC 1.024 11/16/2016 1730   PHURINE 6.0 11/16/2016 1730   GLUCOSEU NEGATIVE 11/16/2016 1730   HGBUR NEGATIVE 11/16/2016 1730   BILIRUBINUR NEGATIVE 11/16/2016 1730   KETONESUR NEGATIVE 11/16/2016 1730   PROTEINUR 100 (A) 11/16/2016 1730   UROBILINOGEN 1.0 05/07/2012 1554   NITRITE NEGATIVE 11/16/2016 1730   LEUKOCYTESUR TRACE (A) 11/16/2016 1730   Sepsis Labs: @LABRCNTIP (procalcitonin:4,lacticidven:4)  ) Recent Results (from the past 240 hour(s))  C difficile quick scan w PCR reflex     Status: None   Collection Time: 11/12/16  4:25 AM  Result Value Ref Range Status   C Diff antigen NEGATIVE NEGATIVE Final   C Diff toxin NEGATIVE NEGATIVE Final   C Diff interpretation No C. difficile detected.  Final  Urine culture     Status: Abnormal   Collection Time: 11/12/16  5:24 AM  Result Value Ref Range Status   Specimen Description URINE, CLEAN CATCH  Final   Special Requests NONE  Final   Culture MULTIPLE SPECIES PRESENT, SUGGEST RECOLLECTION (A)  Final   Report Status 11/13/2016 FINAL  Final         Radiology Studies: No results found.      Scheduled Meds: .  allopurinol  300 mg Oral Daily  . aspirin EC  81 mg Oral Daily  . cholecalciferol  1,000 Units Oral Daily  . enoxaparin (LOVENOX) injection  60 mg Subcutaneous Q24H  . escitalopram  20 mg Oral Daily  . fluticasone furoate-vilanterol  1 puff Inhalation Daily  . metoprolol succinate  12.5 mg Oral Daily  . potassium chloride  40 mEq Oral BID  . simvastatin  20 mg Oral  QHS  . sodium chloride flush  3 mL Intravenous Q12H  . vitamin B-12  2,500 mcg Oral Daily   Continuous Infusions:   LOS: 4 days    Time spent:78min    Zannie Cove, MD [facility] Pager [phone]  If 7PM-7AM, please contact night-coverage [email or web address] Password [facility] 11/20/2016, 12:46 PM

## 2016-11-20 NOTE — Progress Notes (Signed)
Patient Name: Margaret Vargas Date of Encounter: 11/20/2016  Principal Problem:   Acute CHF (congestive heart failure) (HCC) Active Problems:   History of transient ischemic attack (TIA)   Essential hypertension   Depression   SOB (shortness of breath)   Acute on chronic combined systolic and diastolic CHF (congestive heart failure) (HCC)   Mitral valve insufficiency   NSVT (nonsustained ventricular tachycardia) (HCC)   Length of Stay: 4  SUBJECTIVE  The patient is feeling great, she is walking, she felt mild dizziness.   CURRENT MEDS . allopurinol  300 mg Oral Daily  . aspirin EC  81 mg Oral Daily  . cholecalciferol  1,000 Units Oral Daily  . enoxaparin (LOVENOX) injection  60 mg Subcutaneous Q24H  . escitalopram  20 mg Oral Daily  . fluticasone furoate-vilanterol  1 puff Inhalation Daily  . metoprolol succinate  12.5 mg Oral Daily  . potassium chloride  40 mEq Oral BID  . simvastatin  20 mg Oral QHS  . sodium chloride flush  3 mL Intravenous Q12H  . vitamin B-12  2,500 mcg Oral Daily    OBJECTIVE  Vitals:   11/19/16 1700 11/19/16 2004 11/20/16 0617 11/20/16 0900  BP: 132/72 128/73 98/65 (!) 113/54  Pulse: 85 80 74 80  Resp: 19 18    Temp:  98 F (36.7 C) 97.5 F (36.4 C)   TempSrc:  Oral Oral   SpO2: 100% 97% 95%   Weight:   294 lb 6.4 oz (133.5 kg)   Height:        Intake/Output Summary (Last 24 hours) at 11/20/16 0928 Last data filed at 11/20/16 0909  Gross per 24 hour  Intake              120 ml  Output             2700 ml  Net            -2580 ml   Filed Weights   11/18/16 0457 11/19/16 0607 11/20/16 0617  Weight: 299 lb 4.8 oz (135.8 kg) 293 lb 8 oz (133.1 kg) 294 lb 6.4 oz (133.5 kg)   PHYSICAL EXAM  General: Pleasant, NAD, obese. Neuro: Alert and oriented X 3. Moves all extremities spontaneously. Psych: Normal affect. HEENT:  Normal  Neck: Supple without bruits or JVD. Lungs:  Resp regular and unlabored, CTA. Heart: RRR no s3, s4,  or murmurs. Abdomen: Soft, non-tender, non-distended, BS + x 4.  Extremities: No clubbing, cyanosis , mild edema. DP/PT/Radials 2+ and equal bilaterally.  Accessory Clinical Findings   Recent Labs  11/18/16 0830 11/19/16 0312 11/20/16 0152  NA  --  140 139  K  --  3.9 4.6  CL  --  101 101  CO2  --  30 31  GLUCOSE  --  108* 134*  BUN  --  17 22*  CREATININE  --  0.98 1.14*  CALCIUM  --  9.5 9.4  MG 1.9  --   --    Dg Chest 2 View  Result Date: 11/16/2016 CLINICAL DATA:  Distended abdomen. Shortness of breath. Fluid weight gain.  IMPRESSION: Congestive heart failure/fluid overload. Enlarged cardiac silhouette. Venous hypertension. Mild interstitial edema and small effusions Electronically Signed     Ct Abdomen Pelvis W Contrast  Result Date: 11/12/2016 CLINICAL DATA:  Epigastric pain and diarrhea. History of hysterectomy and right oophorectomy  IMPRESSION: Two cystic structures in the pelvis measuring 41 x 48 mm, and 46 mm in  diameter. Small amount of free fluid in the pelvis and around the liver. Possible left ovary cystic rupture or hemorrhage. Normal appendix Small right pleural effusion Cardiac enlargement Probable fatty infiltration of the liver.  TELE: SR to ST, personally reviewed  TTE: 11/17/2016 - Left ventricle: The cavity size was mildly dilated. Wall   thickness was increased in a pattern of mild LVH. Systolic   function was severely reduced. The estimated ejection fraction   was in the range of 25% to 30%. Dyskinesis of the   basal-midanteroseptal and anterior myocardium. Moderate   hypokinesis of the lateral and inferior myocardium. - Aortic valve: There was trivial regurgitation. - Mitral valve: Mildly calcified annulus. There was severe   regurgitation. - Left atrium: The atrium was moderately to severely dilated. - Right ventricle: The cavity size was mildly dilated. Wall   thickness was normal. - Right atrium: The atrium was dilated. - Tricuspid valve: There  was moderate regurgitation. - Pulmonary arteries: Systolic pressure was moderately increased.   PA peak pressure: 64 mm Hg (S).    ASSESSMENT AND PLAN  1  Acute systolic CHF   Pt a 66 yo with about 1 year history of SOB / chest pressure with exertion  Significantly worse over past week before admission.  Admitted on 4/4  Found to be in heart failure . LVEF 25-30% with regional WMA. Great diuresis, - 2.5 L overnight and down 12 lbs in 3 days.  Left and right cath scheduled for 11/21/2016. Added Toprol XL 12.5 mg po daily.  She appears euvolemic with hypotension, we will hold diuretics today, BUN/Cr, K stable.    2  Mitral regurgitation. Cath to define anatomy and pressures   3  Lipids  Pt has evid of some atherosclerosis of aorta  Should be on statin  Get lipids in AM    4  HTN  Follow on meds    5. nsVT - 6 beats asymptomatic, none after adding Toprol XL 12.5 mg po daily   Signed, Tobias Alexander MD, [facility] 11/20/2016

## 2016-11-21 ENCOUNTER — Encounter (HOSPITAL_COMMUNITY): Payer: Self-pay | Admitting: Cardiovascular Disease

## 2016-11-21 ENCOUNTER — Inpatient Hospital Stay (HOSPITAL_COMMUNITY)
Admission: EM | Disposition: A | Discharge status: Home / Self Care | Payer: Self-pay | Source: Home / Self Care | Attending: Internal Medicine

## 2016-11-21 DIAGNOSIS — I428 Other cardiomyopathies: Secondary | ICD-10-CM

## 2016-11-21 HISTORY — PX: RIGHT/LEFT HEART CATH AND CORONARY ANGIOGRAPHY: CATH118266

## 2016-11-21 LAB — CBC
HCT: 38.4 % (ref 36.0–46.0)
Hemoglobin: 12 g/dL (ref 12.0–15.0)
MCH: 30.4 pg (ref 26.0–34.0)
MCHC: 31.3 g/dL (ref 30.0–36.0)
MCV: 97.2 fL (ref 78.0–100.0)
PLATELETS: 280 10*3/uL (ref 150–400)
RBC: 3.95 MIL/uL (ref 3.87–5.11)
RDW: 15.4 % (ref 11.5–15.5)
WBC: 7.6 10*3/uL (ref 4.0–10.5)

## 2016-11-21 LAB — BASIC METABOLIC PANEL
Anion gap: 12 (ref 5–15)
BUN: 18 mg/dL (ref 6–20)
CO2: 25 mmol/L (ref 22–32)
CREATININE: 0.99 mg/dL (ref 0.44–1.00)
Calcium: 9.4 mg/dL (ref 8.9–10.3)
Chloride: 100 mmol/L — ABNORMAL LOW (ref 101–111)
GFR calc Af Amer: >60 mL/min (ref >60)
GFR, EST NON AFRICAN AMERICAN: 59 mL/min — AB (ref >60)
Glucose, Bld: 111 mg/dL — ABNORMAL HIGH (ref 65–99)
Potassium: 4.3 mmol/L (ref 3.5–5.1)
Sodium: 137 mmol/L (ref 135–145)

## 2016-11-21 LAB — POCT I-STAT 3, VENOUS BLOOD GAS (G3P V)
Acid-Base Excess: 2 mmol/L (ref 0.0–2.0)
Bicarbonate: 28.9 mmol/L — ABNORMAL HIGH (ref 20.0–28.0)
O2 SAT: 53 %
PCO2 VEN: 52.3 mmHg (ref 44.0–60.0)
PH VEN: 7.35 (ref 7.250–7.430)
PO2 VEN: 30 mmHg — AB (ref 32.0–45.0)
TCO2: 30 mmol/L (ref 0–100)

## 2016-11-21 LAB — POCT I-STAT 3, ART BLOOD GAS (G3+)
Acid-Base Excess: 1 mmol/L (ref 0.0–2.0)
Bicarbonate: 26.8 mmol/L (ref 20.0–28.0)
O2 Saturation: 96 %
PCO2 ART: 44.5 mmHg (ref 32.0–48.0)
PH ART: 7.388 (ref 7.350–7.450)
TCO2: 28 mmol/L (ref 0–100)
pO2, Arterial: 81 mmHg — ABNORMAL LOW (ref 83.0–108.0)

## 2016-11-21 LAB — PROTIME-INR
INR: 1.14
PROTHROMBIN TIME: 14.6 s (ref 11.4–15.2)

## 2016-11-21 SURGERY — RIGHT/LEFT HEART CATH AND CORONARY ANGIOGRAPHY
Anesthesia: LOCAL

## 2016-11-21 MED ORDER — ASPIRIN EC 81 MG PO TBEC
81.0000 mg | DELAYED_RELEASE_TABLET | Freq: Every day | ORAL | Status: DC
  Administered 2016-11-22 – 2016-11-23 (×2): 81 mg via ORAL
  Filled 2016-11-21 (×2): qty 1

## 2016-11-21 MED ORDER — SODIUM CHLORIDE 0.9% FLUSH
3.0000 mL | Freq: Two times a day (BID) | INTRAVENOUS | Status: DC
  Administered 2016-11-22: 3 mL via INTRAVENOUS

## 2016-11-21 MED ORDER — SODIUM CHLORIDE 0.9% FLUSH: 3.0000 mL | INTRAVENOUS | Status: DC | PRN

## 2016-11-21 MED ORDER — VERAPAMIL HCL 2.5 MG/ML IV SOLN
INTRAVENOUS | Status: DC | PRN
  Administered 2016-11-21: 10 mL via INTRA_ARTERIAL

## 2016-11-21 MED ORDER — SODIUM CHLORIDE 0.9 % IV SOLN
INTRAVENOUS | Status: DC
  Administered 2016-11-21: 06:00:00 via INTRAVENOUS

## 2016-11-21 MED ORDER — SODIUM CHLORIDE 0.9 % IV SOLN: 250.0000 mL | INTRAVENOUS | Status: DC | PRN

## 2016-11-21 MED ORDER — LIDOCAINE HCL (PF) 1 % IJ SOLN
INTRAMUSCULAR | Status: AC
  Filled 2016-11-21: qty 30

## 2016-11-21 MED ORDER — MIDAZOLAM HCL 2 MG/2ML IJ SOLN
INTRAMUSCULAR | Status: DC | PRN
  Administered 2016-11-21: 2 mg via INTRAVENOUS

## 2016-11-21 MED ORDER — MIDAZOLAM HCL 2 MG/2ML IJ SOLN
INTRAMUSCULAR | Status: AC
  Filled 2016-11-21: qty 2

## 2016-11-21 MED ORDER — VERAPAMIL HCL 2.5 MG/ML IV SOLN
INTRAVENOUS | Status: AC
  Filled 2016-11-21: qty 2

## 2016-11-21 MED ORDER — SODIUM CHLORIDE 0.9 % IV SOLN
INTRAVENOUS | Status: DC
  Administered 2016-11-21: 02:00:00 via INTRAVENOUS

## 2016-11-21 MED ORDER — ASPIRIN 81 MG PO CHEW
81.0000 mg | CHEWABLE_TABLET | ORAL | Status: AC
  Administered 2016-11-21: 81 mg via ORAL
  Filled 2016-11-21: qty 1

## 2016-11-21 MED ORDER — FENTANYL CITRATE (PF) 100 MCG/2ML IJ SOLN
INTRAMUSCULAR | Status: DC | PRN
  Administered 2016-11-21: 50 ug via INTRAVENOUS

## 2016-11-21 MED ORDER — IOPAMIDOL (ISOVUE-370) INJECTION 76%
INTRAVENOUS | Status: DC | PRN
  Administered 2016-11-21: 75 mL via INTRA_ARTERIAL

## 2016-11-21 MED ORDER — FENTANYL CITRATE (PF) 100 MCG/2ML IJ SOLN
INTRAMUSCULAR | Status: AC
Start: 2016-11-21 — End: 2016-11-21
  Filled 2016-11-21: qty 2

## 2016-11-21 MED ORDER — HEPARIN (PORCINE) IN NACL 2-0.9 UNIT/ML-% IJ SOLN
INTRAMUSCULAR | Status: AC
  Filled 2016-11-21: qty 1000

## 2016-11-21 MED ORDER — LIDOCAINE HCL (PF) 1 % IJ SOLN
INTRAMUSCULAR | Status: DC | PRN
  Administered 2016-11-21: 2 mL

## 2016-11-21 MED ORDER — SODIUM CHLORIDE 0.9 % IV SOLN
INTRAVENOUS | Status: AC
  Administered 2016-11-21: 09:00:00 via INTRAVENOUS

## 2016-11-21 MED ORDER — IOPAMIDOL (ISOVUE-370) INJECTION 76%
INTRAVENOUS | Status: AC
  Filled 2016-11-21: qty 100

## 2016-11-21 MED ORDER — SODIUM CHLORIDE 0.9% FLUSH: 3.0000 mL | Freq: Two times a day (BID) | INTRAVENOUS | Status: DC

## 2016-11-21 MED ORDER — HEPARIN SODIUM (PORCINE) 1000 UNIT/ML IJ SOLN
INTRAMUSCULAR | Status: AC
  Filled 2016-11-21: qty 1

## 2016-11-21 SURGICAL SUPPLY — 13 items
CATH 5FR JL3.5 JR4 ANG PIG MP (CATHETERS) ×2 IMPLANT
CATH BALLN WEDGE 5F 110CM (CATHETERS) ×2 IMPLANT
DEVICE RAD COMP TR BAND LRG (VASCULAR PRODUCTS) ×2 IMPLANT
GLIDESHEATH SLEND SS 6F .021 (SHEATH) ×2 IMPLANT
GUIDEWIRE INQWIRE 1.5J.035X260 (WIRE) ×1 IMPLANT
INQWIRE 1.5J .035X260CM (WIRE) ×2
KIT HEART LEFT (KITS) ×2 IMPLANT
PACK CARDIAC CATHETERIZATION (CUSTOM PROCEDURE TRAY) ×2 IMPLANT
SHEATH GLIDE SLENDER 4/5FR (SHEATH) ×2 IMPLANT
SYR MEDRAD MARK V 150ML (SYRINGE) ×2 IMPLANT
TRANSDUCER W/STOPCOCK (MISCELLANEOUS) ×2 IMPLANT
TUBING CIL FLEX 10 FLL-RA (TUBING) ×2 IMPLANT
WIRE EMERALD 3MM-J .025X260CM (WIRE) ×2 IMPLANT

## 2016-11-21 NOTE — Progress Notes (Signed)
Patient is ready to go down to the cath lab, transport here.

## 2016-11-21 NOTE — Progress Notes (Signed)
TR band removed at 1620. A 2x2 guaze with tegaderm was applied. Site remains level 0. R pulses remain 2+, extremity is warm to touch, normal color for ethnicity. Vital signs stable. Pt instructed to not put any pressure or use arm. Pt instructed to call if arm begins to bleed. Pt verbalized understanding. Will continue to monitor pt.  Wynona Neat RN

## 2016-11-21 NOTE — H&P (View-Only) (Signed)
Progress Note  Patient Name: Margaret Vargas Date of Encounter: 11/21/2016  Primary Cardiologist: Dietrich Pates  Subjective   Less dyspnea for right and left cath this am   Inpatient Medications    Scheduled Meds: . [MAR Hold] allopurinol  300 mg Oral Daily  . [MAR Hold] aspirin EC  81 mg Oral Daily  . [MAR Hold] cholecalciferol  1,000 Units Oral Daily  . [MAR Hold] enoxaparin (LOVENOX) injection  60 mg Subcutaneous Q24H  . [MAR Hold] escitalopram  20 mg Oral Daily  . [MAR Hold] fluticasone furoate-vilanterol  1 puff Inhalation Daily  . [MAR Hold] metoprolol succinate  12.5 mg Oral Daily  . [MAR Hold] simvastatin  20 mg Oral QHS  . [MAR Hold] sodium chloride flush  3 mL Intravenous Q12H  . sodium chloride flush  3 mL Intravenous Q12H  . [MAR Hold] vitamin B-12  2,500 mcg Oral Daily   Continuous Infusions: . sodium chloride 10 mL/hr at 11/21/16 0537   PRN Meds: [MAR Hold] sodium chloride, sodium chloride, [MAR Hold] acetaminophen, [MAR Hold] albuterol, [MAR Hold] ALPRAZolam, [MAR Hold] bisacodyl, [MAR Hold] cyclobenzaprine, [MAR Hold] fluticasone, [MAR Hold] ondansetron (ZOFRAN) IV, [MAR Hold] polyethylene glycol, [MAR Hold] polyvinyl alcohol, [MAR Hold] senna-docusate, [MAR Hold] sodium chloride flush, sodium chloride flush   Vital Signs    Vitals:   11/20/16 0934 11/20/16 1300 11/20/16 2043 11/21/16 0536  BP:  136/76 133/89 113/86  Pulse:  81 93 75  Resp:  20 20 18   Temp:  97.5 F (36.4 C) 98.1 F (36.7 C) 98.1 F (36.7 C)  TempSrc:  Oral Oral Oral  SpO2: 96% 99% 99% 98%  Weight:    293 lb 9.6 oz (133.2 kg)  Height:        Intake/Output Summary (Last 24 hours) at 11/21/16 0725 Last data filed at 11/21/16 0537  Gross per 24 hour  Intake           851.17 ml  Output             2300 ml  Net         -1448.83 ml   Filed Weights   11/19/16 0607 11/20/16 0617 11/21/16 0536  Weight: 293 lb 8 oz (133.1 kg) 294 lb 6.4 oz (133.5 kg) 293 lb 9.6 oz (133.2 kg)     Telemetry    NSR 11/21/2016  - Personally Reviewed  ECG    SR LBBB 11/21/2016  - Personally Reviewed  Physical Exam   GEN: No acute distress.   Neck: No JVD Cardiac: RRR, MR  murmurs, rubs, or gallops.  Respiratory: Clear to auscultation bilaterally. GI: Soft, nontender, non-distended  MS: No edema; No deformity. Neuro:  Nonfocal  Psych: Normal affect   Labs    Chemistry Recent Labs Lab 11/16/16 1342  11/19/16 0312 11/20/16 0152 11/21/16 0556  NA 141  < > 140 139 137  K 4.1  < > 3.9 4.6 4.3  CL 107  < > 101 101 100*  CO2 25  < > 30 31 25   GLUCOSE 108*  < > 108* 134* 111*  BUN 17  < > 17 22* 18  CREATININE 0.95  < > 0.98 1.14* 0.99  CALCIUM 9.4  < > 9.5 9.4 9.4  PROT 5.9*  --   --   --   --   ALBUMIN 3.9  --   --   --   --   AST 44*  --   --   --   --  ALT 55*  --   --   --   --   ALKPHOS 30*  --   --   --   --   BILITOT 0.8  --   --   --   --   GFRNONAA >60  < > 60* 50* 59*  GFRAA >60  < > >60 58* >60  ANIONGAP 9  < > 9 7 12   < > = values in this interval not displayed.   Hematology  Recent Labs Lab 11/16/16 1342 11/21/16 0556  WBC 9.2 7.6  RBC 3.92 3.95  HGB 11.8* 12.0  HCT 37.9 38.4  MCV 96.7 97.2  MCH 30.1 30.4  MCHC 31.1 31.3  RDW 15.7* 15.4  PLT 301 280    Cardiac Enzymes  Recent Labs Lab 11/16/16 1703  TROPONINI <0.03   No results for input(s): TROPIPOC in the last 168 hours.   BNP  Recent Labs Lab 11/16/16 1703  BNP 673.4*     DDimer No results for input(s): DDIMER in the last 168 hours.   Radiology    No results found.  Cardiac Studies   Echo EF 25-30% severe MR   Patient Profile     66 y.o. female admitted with CHF Echo done 11/17/16 with EF 25-30% RWMA;s And severe MR diuresed and for right and left cath 11/21/16  Assessment & Plan    CHF:  Improved with diuresis for right cath toda continue current meds adjust according to filling pressures at cath MR:  Likely ischemic severe by echo will have LV gram  assessment today if surgical may need TEE LBBB new since 2013 not clear if from infarct/CAD or DCM no AV block HTN:  Well controlled.  Continue current medications and low sodium Dash type diet.     Signed, Charlton Haws, MD  11/21/2016, 7:25 AM

## 2016-11-21 NOTE — Progress Notes (Signed)
PROGRESS NOTE    Margaret Vargas  ZOX:[id number] DOB: [birth year] DOA: 11/16/2016 PCP: Emeterio Reeve, MD  Brief Narrative:Margaret Vargas is a 66 y.o. female with medical history significant for hypertension, history of TIA, and depression with anxiety presented to the emergency department with 1-2 months of exertional dyspnea and orthopnea with acute worsening over the past several days. Chest x-ray findings were consistent with CHF/volume overload. Improving with diuresis, Cards following, ECHo with Severe MR and WMA and EF 25-30% Cath 4/9  Assessment & Plan:   1. Acute Systolic CHF   -Pt presents with 1-2 mos progressive DOE, wt gain, and orthopnea, worsening acutely in recent days -breathing improving , CXR with cardiomegaly, interstitial edema, and small effusions; BNP 673  -negative 11L -2D ECHO with EF 25-30% with severe MR and WMA,  - Cards consulting, LHC today -hold lasix/ARB held, resume PO lasix tomorrow -CHF education  2. Severe MR -noted on ECHO, pt reports remote h/o Rheumatic fever as a child -define anatomy on Cath today  3. Hypertension  -stable, at home on HCTZ and candesartan; these were held on admission  -ARB held due to drop in BP to 90s yesterday  3. History of TIA  - No acute neurologic s/s  - Continue daily statin and ASA 81    4. Belching/bloating -could be related to fluid overload, recent Abx use -improved  4. Depression, anxiety  - Appears quite stable  - Continue Lexapro and low-dose prn Xanax  DVT prophylaxis: sq Lovenox Code Status: Full  Family Communication: none at bedside Disposition Plan: home pending cardiac workup/stability  Consultants:   Cards   Subjective: Breathing improving almost at baseline yet, leg cramps better  Objective: Vitals:   11/21/16 0826 11/21/16 0831 11/21/16 0836 11/21/16 0840  BP: 127/79     Pulse: 76 (!) 0 (!) 0 (!) 0  Resp: 11 (!) 0 (!) 0 (!) 0  Temp:      TempSrc:      SpO2: 98%  (!) 0% (!) 0% (!) 0%  Weight:      Height:        Intake/Output Summary (Last 24 hours) at 11/21/16 1118 Last data filed at 11/21/16 0537  Gross per 24 hour  Intake           371.17 ml  Output             1300 ml  Net          -928.83 ml   Filed Weights   11/19/16 0607 11/20/16 0617 11/21/16 0536  Weight: 133.1 kg (293 lb 8 oz) 133.5 kg (294 lb 6.4 oz) 133.2 kg (293 lb 9.6 oz)    Examination:  General exam: Appears calm and comfortable  Respiratory system: few basilar crackles Cardiovascular system: S1 & S2 heard, RRR. No JVD, murmurs, rubs, gallops  Gastrointestinal system: Abdomen is nondistended, soft and nontender. No organomegaly or masses felt. Normal bowel sounds heard. Central nervous system: Alert and oriented. No focal neurological deficits. Extremities: Symmetric 5 x 5 power, 1 plus edema Skin: No rashes, lesions or ulcers Psychiatry: Judgement and insight appear normal. Mood & affect appropriate.     Data Reviewed:   CBC:  Recent Labs Lab 11/16/16 1342 11/21/16 0556  WBC 9.2 7.6  HGB 11.8* 12.0  HCT 37.9 38.4  MCV 96.7 97.2  PLT 301 280   Basic Metabolic Panel:  Recent Labs Lab 11/17/16 0356 11/18/16 0359 11/18/16 0830 11/19/16 0312 11/20/16 0152 11/21/16 0556  NA 143  141  --  140 139 137  K 3.8 3.7  --  3.9 4.6 4.3  CL 105 102  --  101 101 100*  CO2 30 29  --  30 31 25   GLUCOSE 121* 98  --  108* 134* 111*  BUN 16 17  --  17 22* 18  CREATININE 1.03* 1.00  --  0.98 1.14* 0.99  CALCIUM 9.4 9.2  --  9.5 9.4 9.4  MG  --   --  1.9  --   --   --    GFR: Estimated Creatinine Clearance: 84.9 mL/min (by C-G formula based on SCr of 0.99 mg/dL). Liver Function Tests:  Recent Labs Lab 11/16/16 1342  AST 44*  ALT 55*  ALKPHOS 30*  BILITOT 0.8  PROT 5.9*  ALBUMIN 3.9    Recent Labs Lab 11/16/16 1342  LIPASE 23   No results for input(s): AMMONIA in the last 168 hours. Coagulation Profile:  Recent Labs Lab 11/21/16 0556  INR 1.14     Cardiac Enzymes:  Recent Labs Lab 11/16/16 1703  TROPONINI <0.03   BNP (last 3 results) No results for input(s): PROBNP in the last 8760 hours. HbA1C: No results for input(s): HGBA1C in the last 72 hours. CBG:  Recent Labs Lab 11/16/16 2120  GLUCAP 125*   Lipid Profile: No results for input(s): CHOL, HDL, LDLCALC, TRIG, CHOLHDL, LDLDIRECT in the last 72 hours. Thyroid Function Tests: No results for input(s): TSH, T4TOTAL, FREET4, T3FREE, THYROIDAB in the last 72 hours. Anemia Panel: No results for input(s): VITAMINB12, FOLATE, FERRITIN, TIBC, IRON, RETICCTPCT in the last 72 hours. Urine analysis:    Component Value Date/Time   COLORURINE YELLOW 11/16/2016 1730   APPEARANCEUR HAZY (A) 11/16/2016 1730   LABSPEC 1.024 11/16/2016 1730   PHURINE 6.0 11/16/2016 1730   GLUCOSEU NEGATIVE 11/16/2016 1730   HGBUR NEGATIVE 11/16/2016 1730   BILIRUBINUR NEGATIVE 11/16/2016 1730   KETONESUR NEGATIVE 11/16/2016 1730   PROTEINUR 100 (A) 11/16/2016 1730   UROBILINOGEN 1.0 05/07/2012 1554   NITRITE NEGATIVE 11/16/2016 1730   LEUKOCYTESUR TRACE (A) 11/16/2016 1730   Sepsis Labs: @LABRCNTIP (procalcitonin:4,lacticidven:4)  ) Recent Results (from the past 240 hour(s))  C difficile quick scan w PCR reflex     Status: None   Collection Time: 11/12/16  4:25 AM  Result Value Ref Range Status   C Diff antigen NEGATIVE NEGATIVE Final   C Diff toxin NEGATIVE NEGATIVE Final   C Diff interpretation No C. difficile detected.  Final  Urine culture     Status: Abnormal   Collection Time: 11/12/16  5:24 AM  Result Value Ref Range Status   Specimen Description URINE, CLEAN CATCH  Final   Special Requests NONE  Final   Culture MULTIPLE SPECIES PRESENT, SUGGEST RECOLLECTION (A)  Final   Report Status 11/13/2016 FINAL  Final         Radiology Studies: No results found.      Scheduled Meds: . allopurinol  300 mg Oral Daily  . [START ON 11/22/2016] aspirin EC  81 mg Oral Daily  .  cholecalciferol  1,000 Units Oral Daily  . escitalopram  20 mg Oral Daily  . fluticasone furoate-vilanterol  1 puff Inhalation Daily  . metoprolol succinate  12.5 mg Oral Daily  . simvastatin  20 mg Oral QHS  . sodium chloride flush  3 mL Intravenous Q12H  . sodium chloride flush  3 mL Intravenous Q12H  . vitamin B-12  2,500 mcg Oral Daily  Continuous Infusions: . sodium chloride 50 mL/hr at 11/21/16 0900     LOS: 5 days    Time spent:69min    Margaret Cove, MD [facility] Pager [phone]  If 7PM-7AM, please contact night-coverage [email or web address] Password [email or web address] 11/21/2016, 11:18 AM

## 2016-11-21 NOTE — Progress Notes (Signed)
Progress Note  Patient Name: Margaret Vargas Date of Encounter: 11/21/2016  Primary Cardiologist: Dietrich Pates  Subjective   Less dyspnea for right and left cath this am   Inpatient Medications    Scheduled Meds: . [MAR Hold] allopurinol  300 mg Oral Daily  . [MAR Hold] aspirin EC  81 mg Oral Daily  . [MAR Hold] cholecalciferol  1,000 Units Oral Daily  . [MAR Hold] enoxaparin (LOVENOX) injection  60 mg Subcutaneous Q24H  . [MAR Hold] escitalopram  20 mg Oral Daily  . [MAR Hold] fluticasone furoate-vilanterol  1 puff Inhalation Daily  . [MAR Hold] metoprolol succinate  12.5 mg Oral Daily  . [MAR Hold] simvastatin  20 mg Oral QHS  . [MAR Hold] sodium chloride flush  3 mL Intravenous Q12H  . sodium chloride flush  3 mL Intravenous Q12H  . [MAR Hold] vitamin B-12  2,500 mcg Oral Daily   Continuous Infusions: . sodium chloride 10 mL/hr at 11/21/16 0537   PRN Meds: [MAR Hold] sodium chloride, sodium chloride, [MAR Hold] acetaminophen, [MAR Hold] albuterol, [MAR Hold] ALPRAZolam, [MAR Hold] bisacodyl, [MAR Hold] cyclobenzaprine, [MAR Hold] fluticasone, [MAR Hold] ondansetron (ZOFRAN) IV, [MAR Hold] polyethylene glycol, [MAR Hold] polyvinyl alcohol, [MAR Hold] senna-docusate, [MAR Hold] sodium chloride flush, sodium chloride flush   Vital Signs    Vitals:   11/20/16 0934 11/20/16 1300 11/20/16 2043 11/21/16 0536  BP:  136/76 133/89 113/86  Pulse:  81 93 75  Resp:  20 20 18   Temp:  97.5 F (36.4 C) 98.1 F (36.7 C) 98.1 F (36.7 C)  TempSrc:  Oral Oral Oral  SpO2: 96% 99% 99% 98%  Weight:    293 lb 9.6 oz (133.2 kg)  Height:        Intake/Output Summary (Last 24 hours) at 11/21/16 0725 Last data filed at 11/21/16 0537  Gross per 24 hour  Intake           851.17 ml  Output             2300 ml  Net         -1448.83 ml   Filed Weights   11/19/16 0607 11/20/16 0617 11/21/16 0536  Weight: 293 lb 8 oz (133.1 kg) 294 lb 6.4 oz (133.5 kg) 293 lb 9.6 oz (133.2 kg)     Telemetry    NSR 11/21/2016  - Personally Reviewed  ECG    SR LBBB 11/21/2016  - Personally Reviewed  Physical Exam   GEN: No acute distress.   Neck: No JVD Cardiac: RRR, MR  murmurs, rubs, or gallops.  Respiratory: Clear to auscultation bilaterally. GI: Soft, nontender, non-distended  MS: No edema; No deformity. Neuro:  Nonfocal  Psych: Normal affect   Labs    Chemistry Recent Labs Lab 11/16/16 1342  11/19/16 0312 11/20/16 0152 11/21/16 0556  NA 141  < > 140 139 137  K 4.1  < > 3.9 4.6 4.3  CL 107  < > 101 101 100*  CO2 25  < > 30 31 25   GLUCOSE 108*  < > 108* 134* 111*  BUN 17  < > 17 22* 18  CREATININE 0.95  < > 0.98 1.14* 0.99  CALCIUM 9.4  < > 9.5 9.4 9.4  PROT 5.9*  --   --   --   --   ALBUMIN 3.9  --   --   --   --   AST 44*  --   --   --   --  ALT 55*  --   --   --   --   ALKPHOS 30*  --   --   --   --   BILITOT 0.8  --   --   --   --   GFRNONAA >60  < > 60* 50* 59*  GFRAA >60  < > >60 58* >60  ANIONGAP 9  < > 9 7 12   < > = values in this interval not displayed.   Hematology  Recent Labs Lab 11/16/16 1342 11/21/16 0556  WBC 9.2 7.6  RBC 3.92 3.95  HGB 11.8* 12.0  HCT 37.9 38.4  MCV 96.7 97.2  MCH 30.1 30.4  MCHC 31.1 31.3  RDW 15.7* 15.4  PLT 301 280    Cardiac Enzymes  Recent Labs Lab 11/16/16 1703  TROPONINI <0.03   No results for input(s): TROPIPOC in the last 168 hours.   BNP  Recent Labs Lab 11/16/16 1703  BNP 673.4*     DDimer No results for input(s): DDIMER in the last 168 hours.   Radiology    No results found.  Cardiac Studies   Echo EF 25-30% severe MR   Patient Profile     66 y.o. female admitted with CHF Echo done 11/17/16 with EF 25-30% RWMA;s And severe MR diuresed and for right and left cath 11/21/16  Assessment & Plan    CHF:  Improved with diuresis for right cath toda continue current meds adjust according to filling pressures at cath MR:  Likely ischemic severe by echo will have LV gram  assessment today if surgical may need TEE LBBB new since 2013 not clear if from infarct/CAD or DCM no AV block HTN:  Well controlled.  Continue current medications and low sodium Dash type diet.     Signed, Charlton Haws, MD  11/21/2016, 7:25 AM

## 2016-11-21 NOTE — Interval H&P Note (Signed)
History and Physical Interval Note:  11/21/2016 7:34 AM  Margaret Vargas  has presented today for cardiac cath with the diagnosis of CHF, cardiomyopathy.  The various methods of treatment have been discussed with the patient and family. After consideration of risks, benefits and other options for treatment, the patient has consented to  Procedure(s): Right/Left Heart Cath and Coronary Angiography (N/A) as a surgical intervention .  The patient's history has been reviewed, patient examined, no change in status, stable for surgery.  I have reviewed the patient's chart and labs.  Questions were answered to the patient's satisfaction.    Cath Lab Visit (complete for each Cath Lab visit)  Clinical Evaluation Leading to the Procedure:   ACS: No.  Non-ACS:    Anginal Classification: CCS II  Anti-ischemic medical therapy: No Therapy  Non-Invasive Test Results: No non-invasive testing performed  Prior CABG: No previous CABG         Verne Carrow

## 2016-11-21 NOTE — Progress Notes (Signed)
Pt removed bandage from L brachial site. Site looks good, no hematoma, minimal bruising.

## 2016-11-22 ENCOUNTER — Encounter (HOSPITAL_COMMUNITY)
Admission: EM | Disposition: A | Discharge status: Home / Self Care | Payer: Self-pay | Source: Home / Self Care | Attending: Internal Medicine

## 2016-11-22 ENCOUNTER — Inpatient Hospital Stay (HOSPITAL_COMMUNITY): Payer: BLUE CROSS/BLUE SHIELD

## 2016-11-22 ENCOUNTER — Encounter (HOSPITAL_COMMUNITY): Payer: Self-pay | Admitting: *Deleted

## 2016-11-22 DIAGNOSIS — I051 Rheumatic mitral insufficiency: Secondary | ICD-10-CM

## 2016-11-22 DIAGNOSIS — I340 Nonrheumatic mitral (valve) insufficiency: Secondary | ICD-10-CM

## 2016-11-22 HISTORY — PX: TEE WITHOUT CARDIOVERSION: SHX5443

## 2016-11-22 LAB — CBC
HEMATOCRIT: 41.2 % (ref 36.0–46.0)
HEMOGLOBIN: 13.2 g/dL (ref 12.0–15.0)
MCH: 31.1 pg (ref 26.0–34.0)
MCHC: 32 g/dL (ref 30.0–36.0)
MCV: 96.9 fL (ref 78.0–100.0)
Platelets: 279 10*3/uL (ref 150–400)
RBC: 4.25 MIL/uL (ref 3.87–5.11)
RDW: 15.5 % (ref 11.5–15.5)
WBC: 7.8 10*3/uL (ref 4.0–10.5)

## 2016-11-22 LAB — BASIC METABOLIC PANEL
ANION GAP: 7 (ref 5–15)
Anion gap: 9 (ref 5–15)
BUN: 18 mg/dL (ref 6–20)
BUN: 15 mg/dL (ref 6–20)
CHLORIDE: 103 mmol/L (ref 101–111)
CHLORIDE: 103 mmol/L (ref 101–111)
CO2: 29 mmol/L (ref 22–32)
CO2: 30 mmol/L (ref 22–32)
CREATININE: 0.92 mg/dL (ref 0.44–1.00)
Calcium: 9.1 mg/dL (ref 8.9–10.3)
Calcium: 9.2 mg/dL (ref 8.9–10.3)
Creatinine, Ser: 0.98 mg/dL (ref 0.44–1.00)
GFR calc Af Amer: >60 mL/min (ref >60)
GFR calc non Af Amer: 60 mL/min — ABNORMAL LOW (ref >60)
GFR calc non Af Amer: >60 mL/min (ref >60)
Glucose, Bld: 116 mg/dL — ABNORMAL HIGH (ref 65–99)
Glucose, Bld: 111 mg/dL — ABNORMAL HIGH (ref 65–99)
Potassium: 4.5 mmol/L (ref 3.5–5.1)
Potassium: 4.4 mmol/L (ref 3.5–5.1)
SODIUM: 139 mmol/L (ref 135–145)
Sodium: 142 mmol/L (ref 135–145)

## 2016-11-22 LAB — ECHO TEE
CHL CUP TV REG PEAK VELOCITY: 297 cm/s
PISA EROA: 0.16 cm2
TR max vel: 297 cm/s
VTI: 147 cm

## 2016-11-22 SURGERY — ECHOCARDIOGRAM, TRANSESOPHAGEAL
Anesthesia: Moderate Sedation

## 2016-11-22 MED ORDER — BUTAMBEN-TETRACAINE-BENZOCAINE 2-2-14 % EX AERO
INHALATION_SPRAY | CUTANEOUS | Status: DC | PRN
  Administered 2016-11-22: 2 via TOPICAL

## 2016-11-22 MED ORDER — SODIUM CHLORIDE 0.9 % IV SOLN
INTRAVENOUS | Status: DC
  Administered 2016-11-22: 10:00:00 via INTRAVENOUS

## 2016-11-22 MED ORDER — FENTANYL CITRATE (PF) 100 MCG/2ML IJ SOLN
INTRAMUSCULAR | Status: DC | PRN
  Administered 2016-11-22 (×2): 25 ug via INTRAVENOUS

## 2016-11-22 MED ORDER — LOSARTAN POTASSIUM 25 MG PO TABS: 25.0000 mg | ORAL_TABLET | Freq: Every day | ORAL | 0 refills | Status: DC

## 2016-11-22 MED ORDER — FUROSEMIDE 20 MG PO TABS: 20.0000 mg | ORAL_TABLET | Freq: Every day | ORAL | 0 refills | Status: DC

## 2016-11-22 MED ORDER — DIPHENHYDRAMINE HCL 50 MG/ML IJ SOLN
INTRAMUSCULAR | Status: AC
  Filled 2016-11-22: qty 1

## 2016-11-22 MED ORDER — LOSARTAN POTASSIUM 25 MG PO TABS
25.0000 mg | ORAL_TABLET | Freq: Every day | ORAL | Status: DC
  Administered 2016-11-22 – 2016-11-23 (×2): 25 mg via ORAL
  Filled 2016-11-22 (×2): qty 1

## 2016-11-22 MED ORDER — FENTANYL CITRATE (PF) 100 MCG/2ML IJ SOLN
INTRAMUSCULAR | Status: AC
  Filled 2016-11-22: qty 2

## 2016-11-22 MED ORDER — METOPROLOL SUCCINATE ER 25 MG PO TB24: 12.5000 mg | ORAL_TABLET | Freq: Every day | ORAL | 0 refills | Status: DC

## 2016-11-22 MED ORDER — FUROSEMIDE 20 MG PO TABS
20.0000 mg | ORAL_TABLET | Freq: Every day | ORAL | Status: DC
  Administered 2016-11-22 – 2016-11-23 (×2): 20 mg via ORAL
  Filled 2016-11-22 (×2): qty 1

## 2016-11-22 MED ORDER — LIDOCAINE VISCOUS 2 % MT SOLN
OROMUCOSAL | Status: DC | PRN
  Administered 2016-11-22: 15 mL via OROMUCOSAL

## 2016-11-22 MED ORDER — MIDAZOLAM HCL 5 MG/ML IJ SOLN
INTRAMUSCULAR | Status: AC
  Filled 2016-11-22: qty 2

## 2016-11-22 MED ORDER — LIDOCAINE VISCOUS 2 % MT SOLN
OROMUCOSAL | Status: AC
  Filled 2016-11-22: qty 15

## 2016-11-22 MED ORDER — MIDAZOLAM HCL 10 MG/2ML IJ SOLN
INTRAMUSCULAR | Status: DC | PRN
  Administered 2016-11-22 (×2): 2 mg via INTRAVENOUS

## 2016-11-22 MED FILL — Heparin Sodium (Porcine) 2 Unit/ML in Sodium Chloride 0.9%: INTRAMUSCULAR | Qty: 500 | Status: AC

## 2016-11-22 NOTE — CV Procedure (Signed)
TRANSESOPHAGEAL ECHOCARDIOGRAM (TEE) NOTE  INDICATIONS: congestive heart failure, mitral regurgitation  PROCEDURE:   Informed consent was obtained prior to the procedure. The risks, benefits and alternatives for the procedure were discussed and the patient comprehended these risks.  Risks include, but are not limited to, cough, sore throat, vomiting, nausea, somnolence, esophageal and stomach trauma or perforation, bleeding, low blood pressure, aspiration, pneumonia, infection, trauma to the teeth and death.    After a procedural time-out, the patient was given 4 mg versed and 50 mcg fentanyl for moderate sedation.  The patient's heart rate, blood pressure, and oxygen saturation are monitored continuously during the procedure.The oropharynx was anesthetized 10 cc of topical 1% viscous lidocaine and 2 cetacaine sprays.  The transesophageal probe was inserted in the esophagus and stomach without difficulty and multiple views were obtained.  The patient was kept under observation until the patient left the procedure room.  The period of conscious sedation is 24 minutes, of which I was present face-to-face 100% of this time. The patient left the procedure room in stable condition.   Agitated microbubble saline contrast was administered.  COMPLICATIONS:    There were no immediate complications.  Findings:  1. LEFT VENTRICLE: The left ventricular wall thickness is normal.  The left ventricular cavity is dilated in size. Wall motion is severely hypokinetic.  LVEF is 25-30%.  2. RIGHT VENTRICLE:  The right ventricle is mildly hypokinetic without any thrombus or masses.    3. LEFT ATRIUM:  The left atrium is moderately dilated in size without any thrombus or masses.  There is not spontaneous echo contrast ("smoke") in the left atrium consistent with a low flow state.  4. LEFT ATRIAL APPENDAGE:  The left atrial appendage is free of any thrombus or masses. The appendage has single lobes. Pulse  doppler indicates high flow in the appendage.  5. ATRIAL SEPTUM:  The atrial septum appears intact and is free of thrombus and/or masses.  There is no evidence for interatrial shunting by color doppler and saline microbubble.  6. RIGHT ATRIUM:  The right atrium is normal in size and function without any thrombus or masses.  7. 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 There are 2 distinct jets - when summed, findings are consistent with Severe regurgitation.  There were no vegetations or stenosis. The annulus was not dilated.  8. AORTIC VALVE:  The aortic valve is trileaflet, normal in structure and function with no regurgitation.  There were no vegetations or stenosis  9. TRICUSPID VALVE:  The tricuspid valve is normal in structure and function with Moderate regurgitation.  There were no vegetations or stenosis. The annulus was not dilated.  10.  PULMONIC VALVE:  The pulmonic valve is normal in structure and function with no regurgitation.  There were no vegetations or stenosis.   11. AORTIC ARCH, ASCENDING AND DESCENDING AORTA:  There was no Myrtis Ser et. Al, 1992) atherosclerosis of the ascending aorta, aortic arch, or proximal descending aorta.  12. PULMONARY VEINS: Anomalous pulmonary venous return was not noted.  13. PERICARDIUM: The pericardium appeared normal and non-thickened.  There is no pericardial effusion.  IMPRESSION:   1. Severe MR with thickening of the mitral valve and a double-oreface closure leading to 2 distinct MR jets. 2. Moderate TR 3. No LAA thrombus 4. Negative for PFO by color doppler and saline microbubble contrast. 5. Moderate  LAE 6. Severely reduced LVEF to 25-30% 7. Mild RV dysfunction  RECOMMENDATIONS:    1.  Continue work-up for repair or replacement of the severely regurgitant mitral valve.  Time Spent Directly with the  Patient:  45 minutes   Chrystie Nose, MD, [facility] Attending Cardiologist [facility]  11/22/2016, 1:16 PM

## 2016-11-22 NOTE — H&P (Signed)
    INTERVAL PROCEDURE H&P  History and Physical Interval Note:  11/22/2016 11:13 AM  Margaret Vargas has presented today for their planned procedure. The various methods of treatment have been discussed with the patient and family. After consideration of risks, benefits and other options for treatment, the patient has consented to the procedure.  The patients' outpatient history has been reviewed, patient examined, and no change in status from most recent office note within the past 30 days. I have reviewed the patients' chart and labs and will proceed as planned. Questions were answered to the patient's satisfaction.   Chrystie Nose, MD, [facility] Attending Cardiologist [facility]  Lisette Abu Lamont Tant 11/22/2016, 11:13 AM

## 2016-11-22 NOTE — Progress Notes (Signed)
PROGRESS NOTE    Margaret Vargas  PVV:[id number] DOB: 1951/02/27 DOA: 11/16/2016 PCP: Emeterio Reeve, MD  Brief Narrative:Margaret Vargas is a 66 y.o. female with medical history significant for hypertension, history of TIA, and depression with anxiety presented to the emergency department with 1-2 months of exertional dyspnea and orthopnea with acute worsening over the past several days. Chest x-ray findings were consistent with CHF/volume overload. Improving with diuresis, Cards following, ECHo with Severe MR and WMA and EF 25-30% Cath 4/9 with normal coronaries, TTE at 4pm today  Assessment & Plan:   1. Acute Systolic CHF   -Pt presents with 1-2 mos progressive DOE, wt gain, and orthopnea, worsening acutely in recent days -breathing improving , CXR with cardiomegaly, interstitial edema, and small effusions; BNP 673  -negative 12.2L -2D ECHO with EF 25-30% with severe MR and WMA,  - Cards consulted, s/p LHC 4/9 with normal coronaries -hold lasix/ARB held, resumed PO lasix, along with low dose BB and ARB today -Bmet in am, d/w Dr.Nishan recommended TEE at 4pm today and discharge home tomorrow with [facility] FU as outpatient -CHF education completed  2. Severe MR -noted on ECHO, pt reports remote h/o Rheumatic fever as a child -TEE this evening  3. Hypertension  -stable, at home on HCTZ and candesartan; these were held on admission  -ARB restarted today along with BB  3. History of TIA  - No acute neurologic s/s  - Continue daily statin and ASA 81    4. Belching/bloating -could be related to fluid overload, recent Abx use -improved  4. Depression, anxiety  - Appears quite stable  - Continue Lexapro and low-dose prn Xanax  DVT prophylaxis: sq Lovenox Code Status: Full  Family Communication: none at bedside Disposition Plan: home tomorrow  Consultants:   Cards  Procedures: Left Heart cath: Conclusion  There is severe left ventricular systolic  dysfunction.  LV end diastolic pressure is moderately elevated.  The left ventricular ejection fraction is less than 25% by visual estimate.  There is moderate (3+) mitral regurgitation.  Hemodynamic findings consistent with moderate pulmonary hypertension.   1. No angiographic evidence of CAD 2. Severe LV systolic dysfunction 3. Severe mitral regurgitation  Recommendations: Would consider TEE to better define mitral valve disease. She will likely need surgical consideration for MV repair/replacement.       Subjective: Breathing improving almost at baseline yet, leg cramps better  Objective: Vitals:   11/21/16 2007 11/22/16 0105 11/22/16 0631 11/22/16 1118  BP: 110/67 104/61 125/77 107/60  Pulse: 80 85 81 76  Resp: 16 17 18 16   Temp: 98 F (36.7 C) 98.4 F (36.9 C) 97.8 F (36.6 C) 98.2 F (36.8 C)  TempSrc: Oral Oral Oral Oral  SpO2: 95% 93% 100% 99%  Weight:   133.5 kg (294 lb 4.8 oz)   Height:        Intake/Output Summary (Last 24 hours) at 11/22/16 1200 Last data filed at 11/22/16 0631  Gross per 24 hour  Intake              563 ml  Output             1800 ml  Net            -1237 ml   Filed Weights   11/20/16 0617 11/21/16 0536 11/22/16 0631  Weight: 133.5 kg (294 lb 6.4 oz) 133.2 kg (293 lb 9.6 oz) 133.5 kg (294 lb 4.8 oz)    Examination:  General exam: Appears calm  and comfortable  Respiratory system: few basilar crackles Cardiovascular system: S1 & S2 heard, RRR. No JVD, murmurs, rubs, gallops  Gastrointestinal system: Abdomen is nondistended, soft and nontender. No organomegaly or masses felt. Normal bowel sounds heard. Central nervous system: Alert and oriented. No focal neurological deficits. Extremities: Symmetric 5 x 5 power, 1 plus edema Skin: No rashes, lesions or ulcers Psychiatry: Judgement and insight appear normal. Mood & affect appropriate.     Data Reviewed:   CBC:  Recent Labs Lab 11/16/16 1342 11/21/16 0556 11/22/16 0641   WBC 9.2 7.6 7.8  HGB 11.8* 12.0 13.2  HCT 37.9 38.4 41.2  MCV 96.7 97.2 96.9  PLT 301 280 279   Basic Metabolic Panel:  Recent Labs Lab 11/18/16 0359 11/18/16 0830 11/19/16 0312 11/20/16 0152 11/21/16 0556 11/22/16 0641  NA 141  --  140 139 137 139  K 3.7  --  3.9 4.6 4.3 4.5  CL 102  --  101 101 100* 103  CO2 29  --  30 31 25 29   GLUCOSE 98  --  108* 134* 111* 116*  BUN 17  --  17 22* 18 18  CREATININE 1.00  --  0.98 1.14* 0.99 0.98  CALCIUM 9.2  --  9.5 9.4 9.4 9.1  MG  --  1.9  --   --   --   --    GFR: Estimated Creatinine Clearance: 85.9 mL/min (by C-G formula based on SCr of 0.98 mg/dL). Liver Function Tests:  Recent Labs Lab 11/16/16 1342  AST 44*  ALT 55*  ALKPHOS 30*  BILITOT 0.8  PROT 5.9*  ALBUMIN 3.9    Recent Labs Lab 11/16/16 1342  LIPASE 23   No results for input(s): AMMONIA in the last 168 hours. Coagulation Profile:  Recent Labs Lab 11/21/16 0556  INR 1.14   Cardiac Enzymes:  Recent Labs Lab 11/16/16 1703  TROPONINI <0.03   BNP (last 3 results) No results for input(s): PROBNP in the last 8760 hours. HbA1C: No results for input(s): HGBA1C in the last 72 hours. CBG:  Recent Labs Lab 11/16/16 2120  GLUCAP 125*   Lipid Profile: No results for input(s): CHOL, HDL, LDLCALC, TRIG, CHOLHDL, LDLDIRECT in the last 72 hours. Thyroid Function Tests: No results for input(s): TSH, T4TOTAL, FREET4, T3FREE, THYROIDAB in the last 72 hours. Anemia Panel: No results for input(s): VITAMINB12, FOLATE, FERRITIN, TIBC, IRON, RETICCTPCT in the last 72 hours. Urine analysis:    Component Value Date/Time   COLORURINE YELLOW 11/16/2016 1730   APPEARANCEUR HAZY (A) 11/16/2016 1730   LABSPEC 1.024 11/16/2016 1730   PHURINE 6.0 11/16/2016 1730   GLUCOSEU NEGATIVE 11/16/2016 1730   HGBUR NEGATIVE 11/16/2016 1730   BILIRUBINUR NEGATIVE 11/16/2016 1730   KETONESUR NEGATIVE 11/16/2016 1730   PROTEINUR 100 (A) 11/16/2016 1730   UROBILINOGEN  1.0 05/07/2012 1554   NITRITE NEGATIVE 11/16/2016 1730   LEUKOCYTESUR TRACE (A) 11/16/2016 1730   Sepsis Labs: @LABRCNTIP (procalcitonin:4,lacticidven:4)  ) No results found for this or any previous visit (from the past 240 hour(s)).       Radiology Studies: No results found.      Scheduled Meds: . [MAR Hold] allopurinol  300 mg Oral Daily  . [MAR Hold] aspirin EC  81 mg Oral Daily  . [MAR Hold] cholecalciferol  1,000 Units Oral Daily  . [MAR Hold] escitalopram  20 mg Oral Daily  . [MAR Hold] fluticasone furoate-vilanterol  1 puff Inhalation Daily  . [MAR Hold] furosemide  20 mg  Oral Daily  . [MAR Hold] losartan  25 mg Oral Daily  . [MAR Hold] metoprolol succinate  12.5 mg Oral Daily  . [MAR Hold] simvastatin  20 mg Oral QHS  . [MAR Hold] sodium chloride flush  3 mL Intravenous Q12H  . [MAR Hold] sodium chloride flush  3 mL Intravenous Q12H  . [MAR Hold] vitamin B-12  2,500 mcg Oral Daily   Continuous Infusions: . sodium chloride 20 mL/hr at 11/22/16 0930     LOS: 6 days    Time spent:61min    Zannie Cove, MD [facility] Pager [phone]  If 7PM-7AM, please contact night-coverage [email or web address] Password [email or web address] 11/22/2016, 12:00 PM

## 2016-11-22 NOTE — Progress Notes (Signed)
Message sent to Margaret Vargas of [facility] for out patient consultation for severe Mitral valve disease.   Berton Bon, NP

## 2016-11-22 NOTE — Progress Notes (Signed)
Patient independent of all of her ADL's, up walking around in the room; Please see full CM note 11/18/2016; Abelino Derrick [facility] [phone]

## 2016-11-22 NOTE — Progress Notes (Signed)
  Echocardiogram  Echocardiogram Transesophageal has been performed.  Leta Jungling M 11/22/2016, 1:31 PM

## 2016-11-22 NOTE — Progress Notes (Signed)
Patient very anxious about "possible procedure", wants to know for sure what procedure she will have and what time it will be so that her family can be here on time. Patient needed antianxiety for sleep.

## 2016-11-22 NOTE — Progress Notes (Signed)
Progress Note  Patient Name: Margaret Vargas Date of Encounter: 11/22/2016  Primary Cardiologist: Dietrich Pates  Subjective   Less dyspnea. Anxious about diagnosis and dyspnea with exertion   Inpatient Medications    Scheduled Meds: . allopurinol  300 mg Oral Daily  . aspirin EC  81 mg Oral Daily  . cholecalciferol  1,000 Units Oral Daily  . escitalopram  20 mg Oral Daily  . fluticasone furoate-vilanterol  1 puff Inhalation Daily  . metoprolol succinate  12.5 mg Oral Daily  . simvastatin  20 mg Oral QHS  . sodium chloride flush  3 mL Intravenous Q12H  . sodium chloride flush  3 mL Intravenous Q12H  . vitamin B-12  2,500 mcg Oral Daily   Continuous Infusions:  PRN Meds: sodium chloride, sodium chloride, acetaminophen, albuterol, ALPRAZolam, bisacodyl, cyclobenzaprine, fluticasone, ondansetron (ZOFRAN) IV, polyethylene glycol, polyvinyl alcohol, senna-docusate, sodium chloride flush, sodium chloride flush   Vital Signs    Vitals:   11/21/16 1201 11/21/16 2007 11/22/16 0105 11/22/16 0631  BP: 122/71 110/67 104/61 125/77  Pulse: 70 80 85 81  Resp:  16 17 18   Temp:  98 F (36.7 C) 98.4 F (36.9 C) 97.8 F (36.6 C)  TempSrc:  Oral Oral Oral  SpO2: 100% 95% 93% 100%  Weight:    294 lb 4.8 oz (133.5 kg)  Height:        Intake/Output Summary (Last 24 hours) at 11/22/16 0859 Last data filed at 11/22/16 0631  Gross per 24 hour  Intake              563 ml  Output             1800 ml  Net            -1237 ml   Filed Weights   11/20/16 0617 11/21/16 0536 11/22/16 0631  Weight: 294 lb 6.4 oz (133.5 kg) 293 lb 9.6 oz (133.2 kg) 294 lb 4.8 oz (133.5 kg)    Telemetry    NSR 11/22/2016  - Personally Reviewed  ECG    SR LBBB 11/22/2016  - Personally Reviewed  Physical Exam   GEN: No acute distress.   Neck: No JVD Cardiac: RRR, MR  murmurs, rubs, or gallops.  Respiratory: Clear to auscultation bilaterally. GI: Soft, nontender, non-distended  MS: No edema; No  deformity. Neuro:  Nonfocal  Psych: Normal affect   Labs    Chemistry Recent Labs Lab 11/16/16 1342  11/20/16 0152 11/21/16 0556 11/22/16 0641  NA 141  < > 139 137 139  K 4.1  < > 4.6 4.3 4.5  CL 107  < > 101 100* 103  CO2 25  < > 31 25 29   GLUCOSE 108*  < > 134* 111* 116*  BUN 17  < > 22* 18 18  CREATININE 0.95  < > 1.14* 0.99 0.98  CALCIUM 9.4  < > 9.4 9.4 9.1  PROT 5.9*  --   --   --   --   ALBUMIN 3.9  --   --   --   --   AST 44*  --   --   --   --   ALT 55*  --   --   --   --   ALKPHOS 30*  --   --   --   --   BILITOT 0.8  --   --   --   --   GFRNONAA >60  < > 50* 59* 60*  GFRAA >60  < > 58* >60 >60  ANIONGAP 9  < > 7 12 7   < > = values in this interval not displayed.   Hematology  Recent Labs Lab 11/16/16 1342 11/21/16 0556 11/22/16 0641  WBC 9.2 7.6 7.8  RBC 3.92 3.95 4.25  HGB 11.8* 12.0 13.2  HCT 37.9 38.4 41.2  MCV 96.7 97.2 96.9  MCH 30.1 30.4 31.1  MCHC 31.1 31.3 32.0  RDW 15.7* 15.4 15.5  PLT 301 280 279    Cardiac Enzymes  Recent Labs Lab 11/16/16 1703  TROPONINI <0.03   No results for input(s): TROPIPOC in the last 168 hours.   BNP  Recent Labs Lab 11/16/16 1703  BNP 673.4*     DDimer No results for input(s): DDIMER in the last 168 hours.   Radiology    No results found.  Cardiac Studies   Echo EF 25-30% severe MR   Right/Left heart cath Conclusion     There is severe left ventricular systolic dysfunction.  LV end diastolic pressure is moderately elevated.  The left ventricular ejection fraction is less than 25% by visual estimate.  There is moderate (3+) mitral regurgitation.  Hemodynamic findings consistent with moderate pulmonary hypertension.   1. No angiographic evidence of CAD 2. Severe LV systolic dysfunction 3. Severe mitral regurgitation  Recommendations: Would consider TEE to better define mitral valve disease. She will likely need surgical consideration for MV repair/replacement.        Patient Profile     66 y.o. female admitted with CHF Echo done 11/17/16 with EF 25-30% RWMA;s And severe MR diuresed and for right and left cath 11/21/16  Assessment & Plan    CHF:   -Improved with diuresis. Lasix 40 mg IV bid.  -Wt down 9 lbs. I&O net negative 12L since admission. Negative 1.2L over last 24h. -current meds adjust according to filling pressures at cath -Right heart cath showed no evidence of CAD, severe LV dysfunction and severe mitral regurgitation. Recommended TEE to better define mitral valve disease. Will likley need surgical consideration for MV repair/replacement.  -Will arrange TEE for today -BB started 4/7  LBBB  -new since 2013 not clear if from infarct/CAD or DCM.  No AV block  HTN -Home meds include candasartan 32 mg, hydrochlorothiazide 25 mg, currently on hold.  -Now on lasix 40 mg bid, metoprolol succinate 12.5 mg started on 4/7 -Well controlled.  -Continue current medications and low sodium Dash type diet.    HLD -On simvastatin 20 mg   Signed, Charlton Haws, MD  11/22/2016, 8:59 AM    Patient examined chart reviewed Not clear that she is a candidate for MV surgery given degree Of LV dysfunction have arranged TEE for today 4:00 with Dr Rennis Golden Orders written and personally discussed with him Retry ARB and oral diuretic BP was low over weekend Likely d/c in am Making outpatient f/u with Dr Cornelius Moras for surgical consultation  Charlton Haws

## 2016-11-23 LAB — BRAIN NATRIURETIC PEPTIDE: B Natriuretic Peptide: 676.8 pg/mL — ABNORMAL HIGH (ref 0.0–100.0)

## 2016-11-23 LAB — CREATININE, SERUM: Creatinine, Ser: 0.93 mg/dL (ref 0.44–1.00)

## 2016-11-23 MED ORDER — BUSPIRONE HCL 5 MG PO TABS: 5.0000 mg | ORAL_TABLET | Freq: Three times a day (TID) | ORAL | 0 refills | Status: DC

## 2016-11-23 NOTE — Progress Notes (Signed)
Heart Failure Navigator Consult Note  Presentation: per Dr Otelia Sergeant t is a 66 yo who was admitted on 4/418 for dyspnea.  Pt has a history of HTN (controlled by her report), TIA. Diagnosed with asthma 1 year ago  Pt says that beginning last summer she had noticed increased SOB with activity  [place] a [place]  Had to stop intermitt last summer because of SOB and chet pressure  This continued through the summer  This winter she had signif bronchitis with SOB  She did not feel like she recovered.  Had to sleep in recliner because couldn't lie flat.  Also had coughing  This week things really got bad with breathing.  Came to ED on 4/4.  Since admit she has been treated with lasix with 5 L of net urine output.  Echo done LVEF 25 to 30% with severe MR.  Pt still coughs when she lays down in bed  Getting better   Ankles improved  Almost at baseline.     Past Medical History:  Diagnosis Date  . Acute CHF (congestive heart failure) (HCC) 11/16/2016   Margaret Vargas 11/16/2016  . Anxiety    Margaret Vargas 11/16/2016  . Arthritis    "neck" (11/16/2016)  . Asthma   . Chronic bronchitis (HCC)   . Chronic neck pain   . Depression    Margaret Vargas 11/16/2016  . Gout   . 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 Heart murmur    "when I was a child"  . High cholesterol   . History of blood transfusion    "w/a back OR"  . Hypertension    Margaret Vargas 11/16/2016  . Pre-diabetes   . Sleep apnea    "need a mask/tests" (11/16/2016)  . Stroke [facility]) 05/2008   "mini stroke"; denies residual on 11/16/2016  . Thyroid nodule   . TIA (transient ischemic attack) "several"    Social History   Social History  . Marital status: Married    Spouse name: N/A  . Number of children: N/A  . Years of education: N/A   Social History Main Topics  . Smoking status: Former Smoker    Packs/day: 0.75    Years: 13.00    Types: Cigarettes    Quit date: 1989  . Smokeless tobacco: Never  Used  . Alcohol use Yes     Comment: 11/16/2016 "might have 3-4 drinks/month"  . Drug use: No     Comment: 11/16/2016 "I smoked marijuana in college"  . Sexual activity: Not Asked   Other Topics Concern  . None   Social History Narrative  . None    ECHO:Study Conclusions-11/17/16  - Left ventricle: The cavity size was mildly dilated. Wall   thickness was increased in a pattern of mild LVH. Systolic   function was severely reduced. The estimated ejection fraction   was in the range of 25% to 30%. Dyskinesis of the   basal-midanteroseptal and anterior myocardium. Moderate   hypokinesis of the lateral and inferior myocardium. - Aortic valve: There was trivial regurgitation. - Mitral valve: Mildly calcified annulus. There was severe   regurgitation. - Left atrium: The atrium was moderately to severely dilated. - Right ventricle: The cavity size was mildly dilated. Wall   thickness was normal. - Right atrium: The atrium was dilated. - Tricuspid valve: There was moderate regurgitation. - Pulmonary arteries: Systolic pressure was moderately increased.   PA peak pressure: 64 mm  Hg (S).  ------------------------------------------------------------------- Study data:  No prior study was available for comparison.  Study status:  Routine.  Procedure:  The patient reported no pain pre or post test. Transthoracic echocardiography. Image quality was adequate.  Study completion:  There were no complications. Transthoracic echocardiography.  M-mode, complete 2D, spectral Doppler, and color Doppler.  Birthdate:  Patient birthdate: 1951/04/19.  Age:  Patient is 66 yr old.  Sex:  Gender: female. BMI: 45 kg/m^2.  Blood pressure:     102/73  Patient status: Inpatient.  Study date:  Study date: 11/17/2016. Study time: 02:45 PM.  Location:  Bedside.  Cardiac Catheterization: 11/21/16 Conclusion     There is severe left ventricular systolic dysfunction.  LV end diastolic pressure is moderately  elevated.  The left ventricular ejection fraction is less than 25% by visual estimate.  There is moderate (3+) mitral regurgitation.  Hemodynamic findings consistent with moderate pulmonary hypertension.   1. No angiographic evidence of CAD 2. Severe LV systolic dysfunction 3. Severe mitral regurgitation  Recommendations: Would consider TEE to better define mitral valve disease. She will likely need surgical consideration for MV repair/replacement.      BNP    Component Value Date/Time   BNP 676.8 (H) 11/23/2016 0436    ProBNP No results found for: PROBNP   Education Assessment and Provision:  Detailed education and instructions provided on heart failure disease management including the following:  Signs and symptoms of Heart Failure When to call the physician Importance of daily weights Low sodium diet Fluid restriction Medication management Anticipated future follow-up appointments  Patient education given on each of the above topics.  Patient acknowledges understanding and acceptance of all instructions.  I spoke with Margaret Vargas concerning her new diagnosis of HF.  She is quite anxious regarding all the "new information" and how she is going to feel going forward.  She continues to complain of SOB with exertion.  We discussed the importance of daily weights and when to contact the physician.  We also briefly discussed a low sodium diet and high sodium foods to avoid.  She denies any issues with getting or taking prescribed medications.  She will follow in the [facility] Clinic after discharge.  Education Materials:  "Living Better With Heart Failure" Booklet, Daily Weight Tracker Tool    High Risk Criteria for Readmission and/or Poor Patient Outcomes:  (Recommend Follow-up with Advanced [facility] Failure Clinic)   EF <30%- Yes 25-30% newly reduced  2 or more admissions in 6 months-no  Difficult social situation-no  Demonstrates medication  noncompliance-no   Barriers of Care:  New HF--Knowledge and compliance  Discharge Planning:   Plans to discharge to home with her husband in [place].

## 2016-11-23 NOTE — Progress Notes (Signed)
Pt escorted outside, all of pt's belongings are in her possession. Pts s/o is pulling his car around to meet pt/staff. AVS reviewed with patient, prescriptions provided.

## 2016-11-23 NOTE — Progress Notes (Addendum)
Page returned by D.Dunn,PA. Clarification received of pt's situation, pt updated; expresses understanding at this time.

## 2016-11-23 NOTE — Progress Notes (Signed)
Page to D.Dunn,PA  3e05 pt expresses a lot of confusion after cardiology left this am. requests a visit or call prior to leaving to clarify what she was told.

## 2016-11-23 NOTE — Discharge Summary (Signed)
Physician Discharge Summary  Margaret Vargas EPP:[id number] DOB: [birth year] DOA: 11/16/2016  PCP: Emeterio Reeve, MD  Admit date: 11/16/2016 Discharge date: 11/23/2016  Time spent: 45 minutes  Recommendations for Outpatient Follow-up:  1. [facility] in  1-2weeks 2. CVTS in 1 month    Discharge Diagnoses:  Principal Problem:   Acute systolic CHF (congestive heart failure) (HCC)   Severe Mitral valve insufficiency   History of transient ischemic attack (TIA)   Essential hypertension   Depression   SOB (shortness of breath)   NSVT (nonsustained ventricular tachycardia) (HCC)   Non-ischemic cardiomyopathy (HCC)   Discharge Condition: stabke  Diet recommendation: low sodium heart healthy  Filed Weights   11/21/16 0536 11/22/16 0631 11/23/16 0611  Weight: 133.2 kg (293 lb 9.6 oz) 133.5 kg (294 lb 4.8 oz) 134.1 kg (295 lb 11.2 oz)    History of present illness:  Margaret Smith-Gravesis a 66 y.o.femalewith medical history significant for hypertension, history of TIA, and depression with anxiety presented to the emergency department with 1-2 months of exertional dyspnea and orthopnea with acute worsening over the past several days. Chest x-ray findings were consistent with CHF/volume overload  Hospital Course:  1. Acute Systolic CHF  -Pt presented with 1-2 mos progressive DOE, wt gain, and orthopnea, worsening acutely in recent days -improved with diuresis, negative 12.2L -2D ECHO with EF 25-30% with severe MR and WMA,  - Cards consulted, s/p LHC 4/9 with normal coronaries - Lasix switched to PO, started on BB and ARM -CVTS FU as outpatient to evaluate for mitral valve repair/replacement -CHF education completed  2. Severe MR -noted on ECHO, pt reports remote h/o Rheumatic fever as a child -TEE notes severe MR, CVTS referral as outpatient  3. Hypertension  -stable, at home on HCTZ and candesartan; these were held on admission  -ARB restarted 4/10 along with  BB  3. History of TIA  - No acute neurologic s/s  - Continue daily statin and ASA 81   4. Belching/bloating -could be related to fluid overload, recent Abx use -resolved  4. Depression, anxiety  - Appears quite stable  - Continue Lexapro and low-dose prn Xanax  Procedures:  Cath    SAY:[id number]:   1. Severe MR with thickening of the mitral valve and a double-oreface closure leading to 2 distinct MR jets. 2. Moderate TR 3. No LAA thrombus 4. Negative for PFO by color doppler and saline microbubble contrast. 5. Moderate LAE 6. Severely reduced LVEF to 25-30%  Mild RV dysfunction  Consultations:  Cardiology  Discharge Exam: Vitals:   11/22/16 2017 11/23/16 0611  BP: 130/66 113/75  Pulse: 73 79  Resp: 18 18  Temp: 97.7 F (36.5 C) 97.7 F (36.5 C)    General: AAOx3 Cardiovascular: S1S2/RRR Respiratory: CTAB  Discharge Instructions    Current Discharge Medication List    START taking these medications   Details  furosemide (LASIX) 20 MG tablet Take 1 tablet (20 mg total) by mouth daily. Qty: 30 tablet, Refills: 0    losartan (COZAAR) 25 MG tablet Take 1 tablet (25 mg total) by mouth daily. Qty: 30 tablet, Refills: 0    metoprolol succinate (TOPROL-XL) 25 MG 24 hr tablet Take 0.5 tablets (12.5 mg total) by mouth daily. Qty: 30 tablet, Refills: 0      CONTINUE these medications which have NOT CHANGED   Details  albuterol (PROVENTIL HFA;VENTOLIN HFA) 108 (90 Base) MCG/ACT inhaler Inhale 2 puffs into the lungs every 6 (six) hours as needed for  wheezing or shortness of breath.    allopurinol (ZYLOPRIM) 300 MG tablet Take 300 mg by mouth daily.    ALPRAZolam (XANAX) 0.25 MG tablet Take 0.125 mg by mouth at bedtime as needed for anxiety.    aspirin EC 81 MG tablet Take 81 mg by mouth every morning.     cholecalciferol (VITAMIN D) 1000 UNITS tablet Take 1,000 Units by mouth daily.    colchicine 0.6 MG tablet Take 0.6 mg by mouth daily as  needed (FLAREUPS).    Cyanocobalamin (VITAMIN B-12) 2500 MCG SUBL Place 2,500 mcg under the tongue daily.    escitalopram (LEXAPRO) 20 MG tablet Take 20 mg by mouth every morning.     fluticasone (FLONASE) 50 MCG/ACT nasal spray Place 1 spray into both nostrils daily as needed for allergies or rhinitis.    fluticasone furoate-vilanterol (BREO ELLIPTA) 200-25 MCG/INH AEPB Inhale 1 puff into the lungs daily.    levalbuterol (XOPENEX) 0.63 MG/3ML nebulizer solution Take 0.63 mg by nebulization every 4 (four) hours as needed for wheezing or shortness of breath.    Propylene Glycol (SYSTANE BALANCE) 0.6 % SOLN Place 1 drop into both eyes 2 (two) times daily as needed (DRY EYES).     simvastatin (ZOCOR) 20 MG tablet Take 20 mg by mouth at bedtime.       STOP taking these medications     candesartan (ATACAND) 32 MG tablet      hydrochlorothiazide (HYDRODIURIL) 25 MG tablet      ibuprofen (ADVIL,MOTRIN) 200 MG tablet        Allergies  Allergen Reactions  . Other Other (See Comments)    Bandaids and some adhesives cause red skin. (pls use paper tape)  . Adhesive [Tape] Itching and Rash  . Morphine And Related Itching   Follow-up Information    Emeterio Reeve, MD. Go on 11/29/2016.   Specialty:  Family Medicine Why:  @2pm  Contact information: [address] [place] [address] [phone]        CVTS Follow up.   Why:  Office will call with appt with Cardiac surgeon           The results of significant diagnostics from this hospitalization (including imaging, microbiology, ancillary and laboratory) are listed below for reference.    Significant Diagnostic Studies: Dg Chest 2 View  Result Date: 11/16/2016 CLINICAL DATA:  Distended abdomen. Shortness of breath. Fluid weight gain. EXAM: CHEST  2 VIEW COMPARISON:  10/07/2016 FINDINGS: Heart is enlarged. There is pulmonary venous hypertension with mild interstitial edema. Tiny amount of fluid in the  pleural space. No collapse or infiltrate. No acute bone finding. IMPRESSION: Congestive heart failure/fluid overload. Enlarged cardiac silhouette. Venous hypertension. Mild interstitial edema and small effusions Electronically Signed   By: Paulina Fusi M.D.   On: 11/16/2016 16:48   Ct Abdomen Pelvis W Contrast  Result Date: 11/12/2016 CLINICAL DATA:  Epigastric pain and diarrhea. History of hysterectomy and right oophorectomy EXAM: CT ABDOMEN AND PELVIS WITH CONTRAST TECHNIQUE: Multidetector CT imaging of the abdomen and pelvis was performed using the standard protocol following bolus administration of intravenous contrast. CONTRAST:  ISOVUE-300 IOPAMIDOL (ISOVUE-300) INJECTION 61% COMPARISON:  None. FINDINGS: Lower chest: Small right pleural effusion. Left lung base clear. No infiltrate. Cardiac enlargement. Hepatobiliary: Cholecystectomy clips. Probable fatty infiltration of liver without biliary dilatation. Small low-density lesions in the right lobe liver most likely cysts. Pancreas: Negative Spleen: Negative Adrenals/Urinary Tract: Negative. No renal mass or obstruction. No renal calculi. Urinary  bladder normal. Stomach/Bowel: Small hiatal hernia. Normal stomach. Negative for bowel obstruction. Air-fluid levels in nondilated small bowel suggesting ileus. Colon is decompressed. Normal appendix. There is some mild stranding in the right lower quadrant however the appendix is not thickened. Vascular/Lymphatic: Mild atherosclerotic disease. No lymphadenopathy. Reproductive: Postop hysterectomy and right oophorectomy. There are 2 cystic structures in the pelvis posterior to the bladder and ventral to the rectum. Left-sided cystic structure is 41 x 48 mm and midline posterior cystic structure is 46 mm in diameter. There is a small amount of free fluid in the pelvis. The cystic structures are likely related to the residual left ovary. Other: Mild amount of free fluid around the liver and in the pelvis.  Musculoskeletal: Pedicle screw fusion L4-5. No acute skeletal abnormality. Disc degeneration and spurring L1-2 and L2-3 IMPRESSION: Two cystic structures in the pelvis measuring 41 x 48 mm, and 46 mm in diameter. Small amount of free fluid in the pelvis and around the liver. Possible left ovary cystic rupture or hemorrhage. Normal appendix Small right pleural effusion Cardiac enlargement Probable fatty infiltration of the liver Electronically Signed   By: Marlan Palau M.D.   On: 11/12/2016 07:52    Microbiology: No results found for this or any previous visit (from the past 240 hour(s)).   Labs: Basic Metabolic Panel:  Recent Labs Lab 11/18/16 0830 11/19/16 0312 11/20/16 0152 11/21/16 0556 11/22/16 0641 11/22/16 1407 11/23/16 0436  NA  --  140 139 137 139 142  --   K  --  3.9 4.6 4.3 4.5 4.4  --   CL  --  101 101 100* 103 103  --   CO2  --  30 31 25 29 30   --   GLUCOSE  --  108* 134* 111* 116* 111*  --   BUN  --  17 22* 18 18 15   --   CREATININE  --  0.98 1.14* 0.99 0.98 0.92 0.93  CALCIUM  --  9.5 9.4 9.4 9.1 9.2  --   MG 1.9  --   --   --   --   --   --    Liver Function Tests:  Recent Labs Lab 11/16/16 1342  AST 44*  ALT 55*  ALKPHOS 30*  BILITOT 0.8  PROT 5.9*  ALBUMIN 3.9    Recent Labs Lab 11/16/16 1342  LIPASE 23   No results for input(s): AMMONIA in the last 168 hours. CBC:  Recent Labs Lab 11/16/16 1342 11/21/16 0556 11/22/16 0641  WBC 9.2 7.6 7.8  HGB 11.8* 12.0 13.2  HCT 37.9 38.4 41.2  MCV 96.7 97.2 96.9  PLT 301 280 279   Cardiac Enzymes:  Recent Labs Lab 11/16/16 1703  TROPONINI <0.03   BNP: BNP (last 3 results)  Recent Labs  11/16/16 1703 11/23/16 0436  BNP 673.4* 676.8*    ProBNP (last 3 results) No results for input(s): PROBNP in the last 8760 hours.  CBG:  Recent Labs Lab 11/16/16 2120  GLUCAP 125*       SignedZannie Cove MD.  [facility] 11/23/2016, 6:30 AM

## 2016-11-23 NOTE — Progress Notes (Addendum)
I spoke with CHF Clinic requesting new patient appointment in 1 week - Jasmine said they had a slot for 1 week with an APP who is able to see new patients. Follow-up has been arranged with Otilio Saber PA-C in the [facility] in the [facility] on 4/18 at 1:30pm. I entered this info on AVS with instructions to arrive at 1:15pm. I confirmed that nurse hasn't printed AVS yet to make sure this will show up on information. Makeila Yamaguchi PA-C

## 2016-11-23 NOTE — Progress Notes (Signed)
Progress Note  Patient Name: Margaret Vargas Date of Encounter: 11/23/2016  Primary Cardiologist: Dietrich Pates  Subjective   Anxiety continues to be an issue No clinical CHF   Inpatient Medications    Scheduled Meds: . allopurinol  300 mg Oral Daily  . aspirin EC  81 mg Oral Daily  . cholecalciferol  1,000 Units Oral Daily  . escitalopram  20 mg Oral Daily  . fluticasone furoate-vilanterol  1 puff Inhalation Daily  . furosemide  20 mg Oral Daily  . losartan  25 mg Oral Daily  . metoprolol succinate  12.5 mg Oral Daily  . simvastatin  20 mg Oral QHS  . sodium chloride flush  3 mL Intravenous Q12H  . sodium chloride flush  3 mL Intravenous Q12H  . vitamin B-12  2,500 mcg Oral Daily   Continuous Infusions:  PRN Meds: sodium chloride, sodium chloride, acetaminophen, albuterol, ALPRAZolam, bisacodyl, cyclobenzaprine, fluticasone, ondansetron (ZOFRAN) IV, polyethylene glycol, polyvinyl alcohol, senna-docusate, sodium chloride flush, sodium chloride flush   Vital Signs    Vitals:   11/22/16 1330 11/22/16 1354 11/22/16 2017 11/23/16 0611  BP: (!) 142/77 (!) 94/55 130/66 113/75  Pulse: 67 63 73 79  Resp: 20 18 18 18   Temp:  97.7 F (36.5 C) 97.7 F (36.5 C) 97.7 F (36.5 C)  TempSrc:  Oral Oral Oral  SpO2: 94% 93% 100% 100%  Weight:    295 lb 11.2 oz (134.1 kg)  Height:        Intake/Output Summary (Last 24 hours) at 11/23/16 0845 Last data filed at 11/23/16 0700  Gross per 24 hour  Intake              460 ml  Output             1850 ml  Net            -1390 ml   Filed Weights   11/21/16 0536 11/22/16 0631 11/23/16 0611  Weight: 293 lb 9.6 oz (133.2 kg) 294 lb 4.8 oz (133.5 kg) 295 lb 11.2 oz (134.1 kg)    Telemetry    NSR 11/23/2016  - Personally Reviewed  ECG    SR LBBB 11/23/2016  - Personally Reviewed  Physical Exam   GEN: No acute distress.   Neck: No JVD Cardiac: RRR, MR  murmurs, rubs, or gallops.  Respiratory: Clear to auscultation  bilaterally. GI: Soft, nontender, non-distended  MS: No edema; No deformity. Neuro:  Nonfocal  Psych: Normal affect   Labs    Chemistry Recent Labs Lab 11/16/16 1342  11/21/16 0556 11/22/16 0641 11/22/16 1407 11/23/16 0436  NA 141  < > 137 139 142  --   K 4.1  < > 4.3 4.5 4.4  --   CL 107  < > 100* 103 103  --   CO2 25  < > 25 29 30   --   GLUCOSE 108*  < > 111* 116* 111*  --   BUN 17  < > 18 18 15   --   CREATININE 0.95  < > 0.99 0.98 0.92 0.93  CALCIUM 9.4  < > 9.4 9.1 9.2  --   PROT 5.9*  --   --   --   --   --   ALBUMIN 3.9  --   --   --   --   --   AST 44*  --   --   --   --   --   ALT 55*  --   --   --   --   --  ALKPHOS 30*  --   --   --   --   --   BILITOT 0.8  --   --   --   --   --   GFRNONAA >60  < > 59* 60* >60 >60  GFRAA >60  < > >60 >60 >60 >60  ANIONGAP 9  < > 12 7 9   --   < > = values in this interval not displayed.   Hematology  Recent Labs Lab 11/16/16 1342 11/21/16 0556 11/22/16 0641  WBC 9.2 7.6 7.8  RBC 3.92 3.95 4.25  HGB 11.8* 12.0 13.2  HCT 37.9 38.4 41.2  MCV 96.7 97.2 96.9  MCH 30.1 30.4 31.1  MCHC 31.1 31.3 32.0  RDW 15.7* 15.4 15.5  PLT 301 280 279    Cardiac Enzymes  Recent Labs Lab 11/16/16 1703  TROPONINI <0.03   No results for input(s): TROPIPOC in the last 168 hours.   BNP  Recent Labs Lab 11/16/16 1703 11/23/16 0436  BNP 673.4* 676.8*     DDimer No results for input(s): DDIMER in the last 168 hours.   Radiology    No results found.  Cardiac Studies   Echo EF 25-30% severe MR   Right/Left heart cath Conclusion     There is severe left ventricular systolic dysfunction.  LV end diastolic pressure is moderately elevated.  The left ventricular ejection fraction is less than 25% by visual estimate.  There is moderate (3+) mitral regurgitation.  Hemodynamic findings consistent with moderate pulmonary hypertension.   1. No angiographic evidence of CAD 2. Severe LV systolic dysfunction 3. Severe  mitral regurgitation  Recommendations: Would consider TEE to better define mitral valve disease. She will likely need surgical consideration for MV repair/replacement.       Patient Profile     65 y.o. female admitted with CHF Echo done 11/17/16 with EF 25-30% RWMA;s And severe MR diuresed and for right and left cath 11/21/16  Assessment & Plan    CHF:  I reviewed her TEE from yesterday Cath with no CAD EF severely reduced and I suspect this would preclude MVR. She is on new CHF meds. She may be a candidate For MV clip although this would require both a medial and lateral clip as there are two Defined areas of mal coaptation nicely visualized on Dr Hilty's TEE in 3D  Will arrange Outpatient f/u with CHF clinic , Dr Tenny Craw and Dr Cornelius Moras.  Discussed with patient that a  Mitral valve repair/replacement is not a quick fix here and she may not be a candidate For this depending on how her EF does with medical Rx  Charlton Haws

## 2016-11-24 NOTE — Discharge Summary (Signed)
Physician Discharge Summary  Margaret Vargas WUJ:[id number] DOB: 1951/03/25 DOA: 11/16/2016  PCP: Emeterio Reeve, MD  Admit date: 11/16/2016 Discharge date: 11/24/2016  Time spent: 45 minutes  Recommendations for Outpatient Follow-up:  1. [facility] in  1-2weeks 2. CVTS in 1 month    Discharge Diagnoses:  Principal Problem:   Acute systolic CHF (congestive heart failure) (HCC)   Severe Mitral valve insufficiency   History of transient ischemic attack (TIA)   Essential hypertension   Depression   SOB (shortness of breath)   NSVT (nonsustained ventricular tachycardia) (HCC)   Non-ischemic cardiomyopathy (HCC)   Discharge Condition: stabke  Diet recommendation: low sodium heart healthy  Filed Weights   11/21/16 0536 11/22/16 0631 11/23/16 0611  Weight: 133.2 kg (293 lb 9.6 oz) 133.5 kg (294 lb 4.8 oz) 134.1 kg (295 lb 11.2 oz)    History of present illness:  Margaret Smith-Gravesis a 66 y.o.femalewith medical history significant for hypertension, history of TIA, and depression with anxiety presented to the emergency department with 1-2 months of exertional dyspnea and orthopnea with acute worsening over the past several days. Chest x-ray findings were consistent with CHF/volume overload  Hospital Course:  1. Acute Systolic CHF  -Pt presented with 1-2 mos progressive DOE, wt gain, and orthopnea, worsening acutely in recent days -improved with diuresis, negative 12.2L -2D ECHO with EF 25-30% with severe MR and WMA,  - Cards consulted, s/p LHC 4/9 with normal coronaries - Lasix switched to PO, started on BB and ARB -CVTS FU as outpatient to evaluate for mitral valve repair/replacement -CHF education completed  2. Severe MR -noted on ECHO, pt reports remote h/o Rheumatic fever as a child -TEE notes severe MR, CVTS referral as outpatient  3. Hypertension  -stable, at home on HCTZ and candesartan; these were held on admission  -ARB restarted 4/10 along with  BB  3. History of TIA  - No acute neurologic s/s  - Continue daily statin and ASA 81   4. Belching/bloating -could be related to fluid overload, recent Abx use -resolved  4. Depression, anxiety  - Appears quite stable  - Continue Lexapro and low-dose prn Xanax  Procedures:  Cath    NFA:[id number]:   1. Severe MR with thickening of the mitral valve and a double-oreface closure leading to 2 distinct MR jets. 2. Moderate TR 3. No LAA thrombus 4. Negative for PFO by color doppler and saline microbubble contrast. 5. Moderate LAE 6. Severely reduced LVEF to 25-30%  Mild RV dysfunction  Consultations:  Cardiology  Discharge Exam: Vitals:   11/23/16 0611 11/23/16 1028  BP: 113/75 123/82  Pulse: 79 82  Resp: 18   Temp: 97.7 F (36.5 C)     General: AAOx3 Cardiovascular: S1S2/RRR Respiratory: CTAB  Discharge Instructions   Discharge Instructions    Diet - low sodium heart healthy    Complete by:  As directed    Discharge instructions    Complete by:  As directed    It is important that you read following instructions as well as go over your medication list with RN to help you understand your care after this hospitalization.  Discharge Instructions: Please follow-up with PCP in one week  Please request your primary care physician to go over all Hospital Tests and Procedure/Radiological results at the follow up,  Please get all Hospital records sent to your PCP by signing hospital release before you go home.   Do not take more than prescribed Pain, Sleep and Anxiety Medications.  You were cared for by a hospitalist during your hospital stay. If you have any questions about your discharge medications or the care you received while you were in the hospital after you are discharged, you can call the unit and ask to speak with the hospitalist on call if the hospitalist that took care of you is not available.  Once you are discharged, your primary care physician  will handle any further medical issues. Please note that NO REFILLS for any discharge medications will be authorized once you are discharged, as it is imperative that you return to your primary care physician (or establish a relationship with a primary care physician if you do not have one) for your aftercare needs so that they can reassess your need for medications and monitor your lab values. You Must read complete instructions/literature along with all the possible adverse reactions/side effects for all the Medicines you take and that have been prescribed to you. Take any new Medicines after you have completely understood and accept all the possible adverse reactions/side effects. Wear Seat belts while driving. If you have smoked or chewed Tobacco in the last 2 yrs please stop smoking and/or stop any Recreational drug use.   Increase activity slowly    Complete by:  As directed      Discharge Medication List as of 11/23/2016 11:47 AM    START taking these medications   Details  busPIRone (BUSPAR) 5 MG tablet Take 1 tablet (5 mg total) by mouth 3 (three) times daily., Starting Wed 11/23/2016, Normal    furosemide (LASIX) 20 MG tablet Take 1 tablet (20 mg total) by mouth daily., Starting Wed 11/23/2016, Print    losartan (COZAAR) 25 MG tablet Take 1 tablet (25 mg total) by mouth daily., Starting Wed 11/23/2016, Print    metoprolol succinate (TOPROL-XL) 25 MG 24 hr tablet Take 0.5 tablets (12.5 mg total) by mouth daily., Starting Wed 11/23/2016, Print      CONTINUE these medications which have NOT CHANGED   Details  albuterol (PROVENTIL HFA;VENTOLIN HFA) 108 (90 Base) MCG/ACT inhaler Inhale 2 puffs into the lungs every 6 (six) hours as needed for wheezing or shortness of breath., Historical Med    allopurinol (ZYLOPRIM) 300 MG tablet Take 300 mg by mouth daily., Historical Med    ALPRAZolam (XANAX) 0.25 MG tablet Take 0.125 mg by mouth at bedtime as needed for anxiety., Historical Med     aspirin EC 81 MG tablet Take 81 mg by mouth every morning. , Historical Med    cholecalciferol (VITAMIN D) 1000 UNITS tablet Take 1,000 Units by mouth daily., Until Discontinued, Historical Med    colchicine 0.6 MG tablet Take 0.6 mg by mouth daily as needed (FLAREUPS)., Historical Med    Cyanocobalamin (VITAMIN B-12) 2500 MCG SUBL Place 2,500 mcg under the tongue daily., Historical Med    escitalopram (LEXAPRO) 20 MG tablet Take 20 mg by mouth every morning. , Historical Med    fluticasone (FLONASE) 50 MCG/ACT nasal spray Place 1 spray into both nostrils daily as needed for allergies or rhinitis., Historical Med    fluticasone furoate-vilanterol (BREO ELLIPTA) 200-25 MCG/INH AEPB Inhale 1 puff into the lungs daily., Historical Med    levalbuterol (XOPENEX) 0.63 MG/3ML nebulizer solution Take 0.63 mg by nebulization every 4 (four) hours as needed for wheezing or shortness of breath., Historical Med    Propylene Glycol (SYSTANE BALANCE) 0.6 % SOLN Place 1 drop into both eyes 2 (two) times daily as needed (DRY EYES). ,  Historical Med    simvastatin (ZOCOR) 20 MG tablet Take 20 mg by mouth at bedtime. , Until Discontinued, Historical Med      STOP taking these medications     candesartan (ATACAND) 32 MG tablet      hydrochlorothiazide (HYDRODIURIL) 25 MG tablet      ibuprofen (ADVIL,MOTRIN) 200 MG tablet        Allergies  Allergen Reactions  . Other Other (See Comments)    Bandaids and some adhesives cause red skin. (pls use paper tape)  . Adhesive [Tape] Itching and Rash  . Morphine And Related Itching   Follow-up Information    Emeterio Reeve, MD. Go on 11/29/2016.   Specialty:  Family Medicine Why:  @2pm  Contact information: [address] [place] [address] [phone]        CVTS Follow up.   Why:  Office will call with appt with Cardiac surgeon       [facility].   Specialty:   Cardiology Why:  Follow-up has been arranged with Otilio Saber PA-C in the Advanced Heart Failure Clinic in the [facility] at [facility] on 4/18 at 1:30pm. Arrive at 1:15pm. Parking code for the garage is 6000. Contact information: [address] [id number] mc [place] [address] [phone]           The results of significant diagnostics from this hospitalization (including imaging, microbiology, ancillary and laboratory) are listed below for reference.    Significant Diagnostic Studies: Dg Chest 2 View  Result Date: 11/16/2016 CLINICAL DATA:  Distended abdomen. Shortness of breath. Fluid weight gain. EXAM: CHEST  2 VIEW COMPARISON:  10/07/2016 FINDINGS: Heart is enlarged. There is pulmonary venous hypertension with mild interstitial edema. Tiny amount of fluid in the pleural space. No collapse or infiltrate. No acute bone finding. IMPRESSION: Congestive heart failure/fluid overload. Enlarged cardiac silhouette. Venous hypertension. Mild interstitial edema and small effusions Electronically Signed   By: Paulina Fusi M.D.   On: 11/16/2016 16:48   Ct Abdomen Pelvis W Contrast  Result Date: 11/12/2016 CLINICAL DATA:  Epigastric pain and diarrhea. History of hysterectomy and right oophorectomy EXAM: CT ABDOMEN AND PELVIS WITH CONTRAST TECHNIQUE: Multidetector CT imaging of the abdomen and pelvis was performed using the standard protocol following bolus administration of intravenous contrast. CONTRAST:  ISOVUE-300 IOPAMIDOL (ISOVUE-300) INJECTION 61% COMPARISON:  None. FINDINGS: Lower chest: Small right pleural effusion. Left lung base clear. No infiltrate. Cardiac enlargement. Hepatobiliary: Cholecystectomy clips. Probable fatty infiltration of liver without biliary dilatation. Small low-density lesions in the right lobe liver most likely cysts. Pancreas: Negative Spleen: Negative Adrenals/Urinary Tract: Negative. No renal mass or obstruction. No  renal calculi. Urinary bladder normal. Stomach/Bowel: Small hiatal hernia. Normal stomach. Negative for bowel obstruction. Air-fluid levels in nondilated small bowel suggesting ileus. Colon is decompressed. Normal appendix. There is some mild stranding in the right lower quadrant however the appendix is not thickened. Vascular/Lymphatic: Mild atherosclerotic disease. No lymphadenopathy. Reproductive: Postop hysterectomy and right oophorectomy. There are 2 cystic structures in the pelvis posterior to the bladder and ventral to the rectum. Left-sided cystic structure is 41 x 48 mm and midline posterior cystic structure is 46 mm in diameter. There is a small amount of free fluid in the pelvis. The cystic structures are likely related to the residual left ovary. Other: Mild amount of free fluid around the liver and in the pelvis. Musculoskeletal: Pedicle screw fusion L4-5. No acute  skeletal abnormality. Disc degeneration and spurring L1-2 and L2-3 IMPRESSION: Two cystic structures in the pelvis measuring 41 x 48 mm, and 46 mm in diameter. Small amount of free fluid in the pelvis and around the liver. Possible left ovary cystic rupture or hemorrhage. Normal appendix Small right pleural effusion Cardiac enlargement Probable fatty infiltration of the liver Electronically Signed   By: Marlan Palau M.D.   On: 11/12/2016 07:52    Microbiology: No results found for this or any previous visit (from the past 240 hour(s)).   Labs: Basic Metabolic Panel:  Recent Labs Lab 11/18/16 0830 11/19/16 0312 11/20/16 0152 11/21/16 0556 11/22/16 0641 11/22/16 1407 11/23/16 0436  NA  --  140 139 137 139 142  --   K  --  3.9 4.6 4.3 4.5 4.4  --   CL  --  101 101 100* 103 103  --   CO2  --  30 31 25 29 30   --   GLUCOSE  --  108* 134* 111* 116* 111*  --   BUN  --  17 22* 18 18 15   --   CREATININE  --  0.98 1.14* 0.99 0.98 0.92 0.93  CALCIUM  --  9.5 9.4 9.4 9.1 9.2  --   MG 1.9  --   --   --   --   --   --    Liver  Function Tests: No results for input(s): AST, ALT, ALKPHOS, BILITOT, PROT, ALBUMIN in the last 168 hours. No results for input(s): LIPASE, AMYLASE in the last 168 hours. No results for input(s): AMMONIA in the last 168 hours. CBC:  Recent Labs Lab 11/21/16 0556 11/22/16 0641  WBC 7.6 7.8  HGB 12.0 13.2  HCT 38.4 41.2  MCV 97.2 96.9  PLT 280 279   Cardiac Enzymes: No results for input(s): CKTOTAL, CKMB, CKMBINDEX, TROPONINI in the last 168 hours. BNP: BNP (last 3 results)  Recent Labs  11/16/16 1703 11/23/16 0436  BNP 673.4* 676.8*    ProBNP (last 3 results) No results for input(s): PROBNP in the last 8760 hours.  CBG: No results for input(s): GLUCAP in the last 168 hours.     Signed:  Lynden Oxford MD.  [facility] 11/24/2016, 3:20 PM

## 2016-11-26 ENCOUNTER — Telehealth: Payer: Self-pay | Admitting: Internal Medicine

## 2016-11-26 ENCOUNTER — Other Ambulatory Visit: Payer: Self-pay

## 2016-11-26 ENCOUNTER — Emergency Department (HOSPITAL_COMMUNITY)
Admission: EM | Admit: 2016-11-26 | Discharge: 2016-11-27 | Disposition: A | Discharge status: Home / Self Care | Payer: BLUE CROSS/BLUE SHIELD | Attending: Emergency Medicine | Admitting: Emergency Medicine

## 2016-11-26 ENCOUNTER — Encounter (HOSPITAL_COMMUNITY): Payer: Self-pay | Admitting: Emergency Medicine

## 2016-11-26 ENCOUNTER — Emergency Department (HOSPITAL_COMMUNITY): Payer: BLUE CROSS/BLUE SHIELD

## 2016-11-26 DIAGNOSIS — R110 Nausea: Secondary | ICD-10-CM | POA: Diagnosis not present

## 2016-11-26 DIAGNOSIS — Z8673 Personal history of transient ischemic attack (TIA), and cerebral infarction without residual deficits: Secondary | ICD-10-CM | POA: Insufficient documentation

## 2016-11-26 DIAGNOSIS — Z79899 Other long term (current) drug therapy: Secondary | ICD-10-CM | POA: Insufficient documentation

## 2016-11-26 DIAGNOSIS — I110 Hypertensive heart disease with heart failure: Secondary | ICD-10-CM | POA: Diagnosis not present

## 2016-11-26 DIAGNOSIS — Z87891 Personal history of nicotine dependence: Secondary | ICD-10-CM | POA: Insufficient documentation

## 2016-11-26 DIAGNOSIS — Z7982 Long term (current) use of aspirin: Secondary | ICD-10-CM | POA: Insufficient documentation

## 2016-11-26 DIAGNOSIS — I5043 Acute on chronic combined systolic (congestive) and diastolic (congestive) heart failure: Secondary | ICD-10-CM | POA: Insufficient documentation

## 2016-11-26 DIAGNOSIS — J45909 Unspecified asthma, uncomplicated: Secondary | ICD-10-CM | POA: Diagnosis not present

## 2016-11-26 DIAGNOSIS — I509 Heart failure, unspecified: Secondary | ICD-10-CM

## 2016-11-26 DIAGNOSIS — R0602 Shortness of breath: Secondary | ICD-10-CM | POA: Diagnosis present

## 2016-11-26 LAB — BRAIN NATRIURETIC PEPTIDE: B Natriuretic Peptide: 697.6 pg/mL — ABNORMAL HIGH (ref 0.0–100.0)

## 2016-11-26 LAB — CBC
HCT: 38.5 % (ref 36.0–46.0)
Hemoglobin: 12.3 g/dL (ref 12.0–15.0)
MCH: 30.7 pg (ref 26.0–34.0)
MCHC: 31.9 g/dL (ref 30.0–36.0)
MCV: 96 fL (ref 78.0–100.0)
PLATELETS: 290 10*3/uL (ref 150–400)
RBC: 4.01 MIL/uL (ref 3.87–5.11)
RDW: 15.4 % (ref 11.5–15.5)
WBC: 9.1 10*3/uL (ref 4.0–10.5)

## 2016-11-26 LAB — BASIC METABOLIC PANEL
Anion gap: 12 (ref 5–15)
BUN: 18 mg/dL (ref 6–20)
CALCIUM: 8.9 mg/dL (ref 8.9–10.3)
CHLORIDE: 103 mmol/L (ref 101–111)
CO2: 23 mmol/L (ref 22–32)
CREATININE: 1.13 mg/dL — AB (ref 0.44–1.00)
GFR calc Af Amer: 58 mL/min — ABNORMAL LOW (ref >60)
GFR calc non Af Amer: 50 mL/min — ABNORMAL LOW (ref >60)
Glucose, Bld: 129 mg/dL — ABNORMAL HIGH (ref 65–99)
Potassium: 4.3 mmol/L (ref 3.5–5.1)
SODIUM: 138 mmol/L (ref 135–145)

## 2016-11-26 LAB — I-STAT TROPONIN, ED: Troponin i, poc: 0.01 ng/mL (ref 0.00–0.08)

## 2016-11-26 MED ORDER — ONDANSETRON HCL 4 MG/2ML IJ SOLN: 4.0000 mg | Freq: Once | INTRAMUSCULAR | Status: DC

## 2016-11-26 MED ORDER — ONDANSETRON 4 MG PO TBDP: 4.0000 mg | ORAL_TABLET | Freq: Three times a day (TID) | ORAL | 0 refills | Status: DC | PRN

## 2016-11-26 MED ORDER — ONDANSETRON HCL 4 MG/2ML IJ SOLN
4.0000 mg | Freq: Once | INTRAMUSCULAR | Status: AC
  Administered 2016-11-26: 4 mg via INTRAVENOUS
  Filled 2016-11-26: qty 2

## 2016-11-26 MED ORDER — LORAZEPAM 2 MG/ML IJ SOLN: 1.0000 mg | Freq: Once | INTRAMUSCULAR | Status: DC

## 2016-11-26 MED ORDER — FUROSEMIDE 10 MG/ML IJ SOLN
40.0000 mg | Freq: Once | INTRAMUSCULAR | Status: AC
  Administered 2016-11-26: 40 mg via INTRAVENOUS
  Filled 2016-11-26: qty 4

## 2016-11-26 NOTE — ED Provider Notes (Signed)
[facility]-EMERGENCY DEPT Provider Note   CSN: [id number] Arrival date & time: 11/26/16  2125     History   Chief Complaint Chief Complaint  Patient presents with  . Shortness of Breath  . Nausea    HPI Margaret Vargas is a 66 y.o. female.  66 year old with PMH of systolic CHF (EF 19-14%), severe mitral regurgitation, and HTN who presents with rapid weight gain (6 lbs in 1.5 days), orthopnea, shortness of breath, decreased urine output, mid abdominal pressure/pain, and nausea. She was discharged from the hospital on Wednesday after a heart failure exacerbation until the onset of the above symptoms on Friday. She denies any chest pain or lower extremity edema. She has been taking all her medications including her lasix. This evening after consulting with her cardiologist she doubled her usual lasix dose to 40mg  and waited 2.5 hours but did produce much urine so she was advised to come to the ED.        Past Medical History:  Diagnosis Date  . Acute CHF (congestive heart failure) (HCC) 11/16/2016   [name] 11/16/2016  . Anxiety    [name] 11/16/2016  . Arthritis    "neck" (11/16/2016)  . Asthma   . Chronic bronchitis (HCC)   . Chronic neck pain   . Depression    [name] 11/16/2016  . Gout   . 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 Heart murmur    "when I was a child"  . High cholesterol   . History of blood transfusion    "w/a back OR"  . Hypertension    [name] 11/16/2016  . Pre-diabetes   . Sleep apnea    "need a mask/tests" (11/16/2016)  . Stroke [facility]) 05/2008   "mini stroke"; denies residual on 11/16/2016  . Thyroid nodule   . TIA (transient ischemic attack) "several"    Patient Active Problem List   Diagnosis Date Noted  . Non-ischemic cardiomyopathy (HCC)   . NSVT (nonsustained ventricular tachycardia) (HCC)   . Acute on chronic combined systolic and diastolic CHF (congestive heart failure) (HCC)   . Mitral valve insufficiency   . Acute CHF (congestive heart failure)  (HCC) 11/16/2016  . Essential hypertension 11/16/2016  . Depression 11/16/2016  . SOB (shortness of breath)   . History of transient ischemic attack (TIA)     Past Surgical History:  Procedure Laterality Date  . BACK SURGERY    . INNER EAR SURGERY Right 1980s  . LAPAROSCOPIC CHOLECYSTECTOMY  1990s  . NEUROPLASTY / TRANSPOSITION MEDIAN NERVE AT CARPAL TUNNEL Left ~ 2010  . POSTERIOR FUSION LUMBAR SPINE  2005  . RIGHT OOPHORECTOMY Right 1990s  . RIGHT/LEFT HEART CATH AND CORONARY ANGIOGRAPHY N/A 11/21/2016   Procedure: Right/Left Heart Cath and Coronary Angiography;  Surgeon: Kathleene Hazel, MD;  Location: [facility] INVASIVE CV LAB;  Service: Cardiovascular;  Laterality: N/A;  . TEAR DUCT PROBING Bilateral 2000s  . TEE WITHOUT CARDIOVERSION N/A 11/22/2016   Procedure: TRANSESOPHAGEAL ECHOCARDIOGRAM (TEE);  Surgeon: Chrystie Nose, MD;  Location: [facility] ENDOSCOPY;  Service: Cardiovascular;  Laterality: N/A;  . VAGINAL HYSTERECTOMY  1989    OB History    No data available       Home Medications    Prior to Admission medications   Medication Sig Start Date End Date Taking? Authorizing Provider  albuterol (PROVENTIL HFA;VENTOLIN HFA) 108 (90 Base) MCG/ACT inhaler Inhale 2 puffs into the lungs every 6 (six) hours as needed for wheezing or shortness of breath.  Historical Provider, MD  allopurinol (ZYLOPRIM) 300 MG tablet Take 300 mg by mouth daily.    Historical Provider, MD  ALPRAZolam Prudy Feeler) 0.25 MG tablet Take 0.125 mg by mouth at bedtime as needed for anxiety.    Historical Provider, MD  aspirin EC 81 MG tablet Take 81 mg by mouth every morning.     Historical Provider, MD  busPIRone (BUSPAR) 5 MG tablet Take 1 tablet (5 mg total) by mouth 3 (three) times daily. 11/23/16   Rolly Salter, MD  cholecalciferol (VITAMIN D) 1000 UNITS tablet Take 1,000 Units by mouth daily.    Historical Provider, MD  colchicine 0.6 MG tablet Take 0.6 mg by mouth daily as needed (FLAREUPS).    Historical  Provider, MD  Cyanocobalamin (VITAMIN B-12) 2500 MCG SUBL Place 2,500 mcg under the tongue daily.    Historical Provider, MD  escitalopram (LEXAPRO) 20 MG tablet Take 20 mg by mouth every morning.     Historical Provider, MD  fluticasone (FLONASE) 50 MCG/ACT nasal spray Place 1 spray into both nostrils daily as needed for allergies or rhinitis.    Historical Provider, MD  fluticasone furoate-vilanterol (BREO ELLIPTA) 200-25 MCG/INH AEPB Inhale 1 puff into the lungs daily.    Historical Provider, MD  furosemide (LASIX) 20 MG tablet Take 1 tablet (20 mg total) by mouth daily. 11/23/16   Zannie Cove, MD  levalbuterol Pauline Aus) 0.63 MG/3ML nebulizer solution Take 0.63 mg by nebulization every 4 (four) hours as needed for wheezing or shortness of breath.    Historical Provider, MD  losartan (COZAAR) 25 MG tablet Take 1 tablet (25 mg total) by mouth daily. 11/23/16   Zannie Cove, MD  metoprolol succinate (TOPROL-XL) 25 MG 24 hr tablet Take 0.5 tablets (12.5 mg total) by mouth daily. 11/23/16   Zannie Cove, MD  ondansetron (ZOFRAN ODT) 4 MG disintegrating tablet Take 1 tablet (4 mg total) by mouth every 8 (eight) hours as needed. 11/26/16   Jacalyn Lefevre, MD  Propylene Glycol (SYSTANE BALANCE) 0.6 % SOLN Place 1 drop into both eyes 2 (two) times daily as needed (DRY EYES).     Historical Provider, MD  simvastatin (ZOCOR) 20 MG tablet Take 20 mg by mouth at bedtime.     Historical Provider, MD    Family History Family History  Problem Relation Age of Onset  . Hypertension Mother     Social History Social History  Substance Use Topics  . Smoking status: Former Smoker    Packs/day: 0.75    Years: 13.00    Types: Cigarettes    Quit date: 1989  . Smokeless tobacco: Never Used  . Alcohol use Yes     Comment: 11/16/2016 "might have 3-4 drinks/month"     Allergies   Other; Adhesive [tape]; and Morphine and related   Review of Systems Review of Systems  Constitutional: Positive for  unexpected weight change.  Cardiovascular: Negative for chest pain, palpitations and leg swelling.  Gastrointestinal: Positive for abdominal distention, abdominal pain and nausea. Negative for vomiting.  Genitourinary: Positive for decreased urine volume.     Physical Exam Updated Vital Signs BP 125/83   Pulse 85   Temp 98.6 F (37 C) (Oral)   Resp (!) 31   Ht 5' 9.5" (1.765 m)   SpO2 97%   Physical Exam  Constitutional: She is oriented to person, place, and time. She appears well-developed and well-nourished. No distress.  HENT:  Head: Normocephalic and atraumatic.  Eyes: Pupils are equal, round, and  reactive to light.  Neck: Neck supple.  Cardiovascular: Normal rate, regular rhythm and normal heart sounds.  Exam reveals no gallop and no friction rub.   Pulmonary/Chest: Effort normal and breath sounds normal.  Abdominal: Soft. Bowel sounds are normal. She exhibits distension.  Neurological: She is alert and oriented to person, place, and time.  Skin: Skin is warm.  Psychiatric: She has a normal mood and affect. Her behavior is normal. Judgment and thought content normal.     ED Treatments / Results  Labs (all labs ordered are listed, but only abnormal results are displayed) Labs Reviewed  BASIC METABOLIC PANEL - Abnormal; Notable for the following:       Result Value   Glucose, Bld 129 (*)    Creatinine, Ser 1.13 (*)    GFR calc non Af Amer 50 (*)    GFR calc Af Amer 58 (*)    All other components within normal limits  BRAIN NATRIURETIC PEPTIDE - Abnormal; Notable for the following:    B Natriuretic Peptide 697.6 (*)    All other components within normal limits  CBC  I-STAT TROPOININ, ED    EKG  EKG Interpretation  Date/Time:  Saturday November 26 2016 21:36:01 EDT Ventricular Rate:  88 PR Interval:  154 QRS Duration: 154 QT Interval:  434 QTC Calculation: 525 R Axis:   -16 Text Interpretation:  Normal sinus rhythm Left bundle branch block Abnormal ECG  Confirmed by Samone Guhl MD, Jaksen Fiorella ([id number]) on 11/26/2016 10:03:43 PM       Radiology Dg Chest 2 View  Result Date: 11/26/2016 CLINICAL DATA:  Congestive heart failure and shortness of breath EXAM: CHEST  2 VIEW COMPARISON:  Chest radiograph 11/16/2016 FINDINGS: The patient's cardiomegaly is unchanged. No pulmonary edema or focal airspace consolidation. No pleural effusion or pneumothorax. IMPRESSION: Unchanged cardiomegaly without pulmonary edema. Electronically Signed   By: Deatra Robinson M.D.   On: 11/26/2016 23:24    Procedures Procedures (including critical care time)  Medications Ordered in ED Medications  ondansetron (ZOFRAN) injection 4 mg (4 mg Intravenous Given 11/26/16 2327)  furosemide (LASIX) injection 40 mg (40 mg Intravenous Given 11/26/16 2327)     Initial Impression / Assessment and Plan / ED Course  I have reviewed the triage vital signs and the nursing notes.  Pertinent labs & imaging results that were available during my care of the patient were reviewed by me and considered in my medical decision making (see chart for details).  Clinical Course as of Nov 26 2357  Sat Nov 26, 2016  2158 EKG 12-Lead [JH]  2244 Comment 3:        [JH]  2301 DG Chest 2 View [JH]    Clinical Course User Index [JH] Jacalyn Lefevre, MD    Pt did urinate here.  Nausea has improved.  She is oxygenating well.  She wants to go home.  She knows to return if worse.  F/u with pcp.  Final Clinical Impressions(s) / ED Diagnoses   Final diagnoses:  Nausea  Congestive heart failure, unspecified HF chronicity, unspecified heart failure type (HCC)    New Prescriptions New Prescriptions   ONDANSETRON (ZOFRAN ODT) 4 MG DISINTEGRATING TABLET    Take 1 tablet (4 mg total) by mouth every 8 (eight) hours as needed.     Jacalyn Lefevre, MD 11/27/16 0000

## 2016-11-26 NOTE — ED Notes (Signed)
Patient transported to X-ray 

## 2016-11-26 NOTE — ED Triage Notes (Signed)
Reports being discharged Wednesday with CHF.  Directions given to call MD if weight gain greater than 3 lbs a day.  Gained 2 pounds overnight and 4 pounds since this morning.  C/O SOB and mid abdominal pain with nausea and no vomiting.  Lungs clear but noted to have some SOB in triage.

## 2016-11-26 NOTE — Discharge Instructions (Addendum)
Increase lasix to 40 mg in the morning starting tomorrow.  Call your doctor Monday morning for further recommendations.

## 2016-11-26 NOTE — Telephone Encounter (Signed)
Patient called stating she was just discharged from [facility] on Wednesday with new diagnosis of CHF.  She states she is currently feeling very short of breath with abdominal bloating, severe nausea, decreased urine output, and needing to sleep in an upright position.  She states yesterday she was 2 lbs up from her dry weight and today is up 4 lbs.  She is currently taking 20 mg of lasix qD.  She has been taking her medications as directed.  I've instructed her to take 40 mg of lasix now and if her urine output continues to be negligible, she needs to come to the ED for evaluation.  Link Snuffer, MD, PhD Cardiology

## 2016-11-29 ENCOUNTER — Telehealth (HOSPITAL_COMMUNITY): Payer: Self-pay | Admitting: Surgery

## 2016-11-29 NOTE — ED Provider Notes (Signed)
[facility]-EMERGENCY DEPT Provider Note   CSN: [id number] Arrival date & time: 11/12/16  4010     History   Chief Complaint Chief Complaint  Patient presents with  . Abdominal Pain    HPI Margaret Vargas is a 66 y.o. female.PMH of c diff here with abdominal pain she states is consistent with c diff. Also mmultiple episodes of diarrhea. She has not taken anything for it. There are no further complpaints.  10 Systems reviewed and are negative for acute change except as noted in the HPI.   HPI  Past Medical History:  Diagnosis Date  . Acute CHF (congestive heart failure) (HCC) 11/16/2016   [name] 11/16/2016  . Anxiety    [name] 11/16/2016  . Arthritis    "neck" (11/16/2016)  . Asthma   . Chronic bronchitis (HCC)   . Chronic neck pain   . Depression    [name] 11/16/2016  . Gout   . 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 Heart murmur    "when I was a child"  . High cholesterol   . History of blood transfusion    "w/a back OR"  . Hypertension    [name] 11/16/2016  . Pre-diabetes   . Sleep apnea    "need a mask/tests" (11/16/2016)  . Stroke [facility]) 05/2008   "mini stroke"; denies residual on 11/16/2016  . Thyroid nodule   . TIA (transient ischemic attack) "several"    Patient Active Problem List   Diagnosis Date Noted  . Non-ischemic cardiomyopathy (HCC)   . NSVT (nonsustained ventricular tachycardia) (HCC)   . Acute on chronic combined systolic and diastolic CHF (congestive heart failure) (HCC)   . Mitral valve insufficiency   . Acute CHF (congestive heart failure) (HCC) 11/16/2016  . Essential hypertension 11/16/2016  . Depression 11/16/2016  . SOB (shortness of breath)   . History of transient ischemic attack (TIA)     Past Surgical History:  Procedure Laterality Date  . BACK SURGERY    . INNER EAR SURGERY Right 1980s  . LAPAROSCOPIC CHOLECYSTECTOMY  1990s  . NEUROPLASTY / TRANSPOSITION MEDIAN NERVE AT CARPAL TUNNEL Left ~ 2010  . POSTERIOR FUSION LUMBAR SPINE  2005    . RIGHT OOPHORECTOMY Right 1990s  . RIGHT/LEFT HEART CATH AND CORONARY ANGIOGRAPHY N/A 11/21/2016   Procedure: Right/Left Heart Cath and Coronary Angiography;  Surgeon: Kathleene Hazel, MD;  Location: [facility] INVASIVE CV LAB;  Service: Cardiovascular;  Laterality: N/A;  . TEAR DUCT PROBING Bilateral 2000s  . TEE WITHOUT CARDIOVERSION N/A 11/22/2016   Procedure: TRANSESOPHAGEAL ECHOCARDIOGRAM (TEE);  Surgeon: Chrystie Nose, MD;  Location: [facility] ENDOSCOPY;  Service: Cardiovascular;  Laterality: N/A;  . VAGINAL HYSTERECTOMY  1989    OB History    No data available       Home Medications    Prior to Admission medications   Medication Sig Start Date End Date Taking? Authorizing Provider  albuterol (PROVENTIL HFA;VENTOLIN HFA) 108 (90 Base) MCG/ACT inhaler Inhale 2 puffs into the lungs every 6 (six) hours as needed for wheezing or shortness of breath.    Historical Provider, MD  allopurinol (ZYLOPRIM) 300 MG tablet Take 300 mg by mouth daily.    Historical Provider, MD  ALPRAZolam Prudy Feeler) 0.25 MG tablet Take 0.125 mg by mouth at bedtime as needed for anxiety.    Historical Provider, MD  aspirin EC 81 MG tablet Take 81 mg by mouth every morning.     Historical Provider, MD  busPIRone (BUSPAR) 5 MG  tablet Take 1 tablet (5 mg total) by mouth 3 (three) times daily. 11/23/16   Rolly Salter, MD  cholecalciferol (VITAMIN D) 1000 UNITS tablet Take 1,000 Units by mouth daily.    Historical Provider, MD  colchicine 0.6 MG tablet Take 0.6 mg by mouth daily as needed (FLAREUPS).    Historical Provider, MD  Cyanocobalamin (VITAMIN B-12) 2500 MCG SUBL Place 2,500 mcg under the tongue daily.    Historical Provider, MD  escitalopram (LEXAPRO) 20 MG tablet Take 20 mg by mouth every morning.     Historical Provider, MD  fluticasone (FLONASE) 50 MCG/ACT nasal spray Place 1 spray into both nostrils daily as needed for allergies or rhinitis.    Historical Provider, MD  fluticasone furoate-vilanterol (BREO  ELLIPTA) 200-25 MCG/INH AEPB Inhale 1 puff into the lungs daily.    Historical Provider, MD  furosemide (LASIX) 20 MG tablet Take 1 tablet (20 mg total) by mouth daily. 11/23/16   Zannie Cove, MD  levalbuterol Pauline Aus) 0.63 MG/3ML nebulizer solution Take 0.63 mg by nebulization every 4 (four) hours as needed for wheezing or shortness of breath.    Historical Provider, MD  losartan (COZAAR) 25 MG tablet Take 1 tablet (25 mg total) by mouth daily. 11/23/16   Zannie Cove, MD  metoprolol succinate (TOPROL-XL) 25 MG 24 hr tablet Take 0.5 tablets (12.5 mg total) by mouth daily. 11/23/16   Zannie Cove, MD  ondansetron (ZOFRAN ODT) 4 MG disintegrating tablet Take 1 tablet (4 mg total) by mouth every 8 (eight) hours as needed. 11/26/16   Jacalyn Lefevre, MD  Propylene Glycol (SYSTANE BALANCE) 0.6 % SOLN Place 1 drop into both eyes 2 (two) times daily as needed (DRY EYES).     Historical Provider, MD  simvastatin (ZOCOR) 20 MG tablet Take 20 mg by mouth at bedtime.     Historical Provider, MD    Family History Family History  Problem Relation Age of Onset  . Hypertension Mother     Social History Social History  Substance Use Topics  . Smoking status: Former Smoker    Packs/day: 0.75    Years: 13.00    Types: Cigarettes    Quit date: 1989  . Smokeless tobacco: Never Used  . Alcohol use Yes     Comment: 11/16/2016 "might have 3-4 drinks/month"     Allergies   Other; Adhesive [tape]; and Morphine and related   Review of Systems Review of Systems   Physical Exam Updated Vital Signs BP (!) 121/56   Pulse 82   Temp (!) 96.6 F (35.9 C) (Axillary)   Resp 20   SpO2 97%   Physical Exam  Constitutional: She is oriented to person, place, and time. She appears well-developed and well-nourished. No distress.  HENT:  Head: Normocephalic and atraumatic.  Nose: Nose normal.  Mouth/Throat: Oropharynx is clear and moist. No oropharyngeal exudate.  Eyes: Conjunctivae and EOM are normal.  Pupils are equal, round, and reactive to light. No scleral icterus.  Neck: Normal range of motion. Neck supple. No JVD present. No tracheal deviation present. No thyromegaly present.  Cardiovascular: Normal rate, regular rhythm and normal heart sounds.  Exam reveals no gallop and no friction rub.   No murmur heard. Pulmonary/Chest: Effort normal and breath sounds normal. No respiratory distress. She has no wheezes. She exhibits no tenderness.  Abdominal: Soft. Bowel sounds are normal. She exhibits no distension and no mass. There is no tenderness. There is no rebound and no guarding.  Musculoskeletal: Normal range  of motion. She exhibits no edema or tenderness.  Lymphadenopathy:    She has no cervical adenopathy.  Neurological: She is alert and oriented to person, place, and time. No cranial nerve deficit. She exhibits normal muscle tone.  Skin: Skin is warm and dry. No rash noted. No erythema. No pallor.  Nursing note and vitals reviewed.    ED Treatments / Results  Labs (all labs ordered are listed, but only abnormal results are displayed) Labs Reviewed  URINE CULTURE - Abnormal; Notable for the following:       Result Value   Culture MULTIPLE SPECIES PRESENT, SUGGEST RECOLLECTION (*)    All other components within normal limits  LIPASE, BLOOD - Abnormal; Notable for the following:    Lipase 63 (*)    All other components within normal limits  COMPREHENSIVE METABOLIC PANEL - Abnormal; Notable for the following:    Glucose, Bld 133 (*)    Alkaline Phosphatase 33 (*)    All other components within normal limits  URINALYSIS, ROUTINE W REFLEX MICROSCOPIC - Abnormal; Notable for the following:    APPearance HAZY (*)    Protein, ur 30 (*)    Leukocytes, UA TRACE (*)    Squamous Epithelial / LPF 0-5 (*)    All other components within normal limits  CBC WITH DIFFERENTIAL/PLATELET - Abnormal; Notable for the following:    RDW 15.6 (*)    All other components within normal limits  C  DIFFICILE QUICK SCREEN W PCR REFLEX    EKG  EKG Interpretation None       Radiology No results found.  Procedures Procedures (including critical care time)  Medications Ordered in ED Medications  ondansetron (ZOFRAN-ODT) disintegrating tablet 4 mg (4 mg Oral Given 11/12/16 0354)  ranitidine (ZANTAC) 150 MG/10ML syrup 300 mg (300 mg Oral Given 11/12/16 0457)  sucralfate (CARAFATE) 1 GM/10ML suspension 1 g (1 g Oral Given 11/12/16 0456)  metoCLOPramide (REGLAN) tablet 10 mg (10 mg Oral Given 11/12/16 0458)  iopamidol (ISOVUE-300) 61 % injection (100 mLs  Contrast Given 11/12/16 0709)  ondansetron (ZOFRAN-ODT) disintegrating tablet 4 mg (4 mg Oral Given 11/12/16 1134)     Initial Impression / Assessment and Plan / ED Course  I have reviewed the triage vital signs and the nursing notes.  Pertinent labs & imaging results that were available during my care of the patient were reviewed by me and considered in my medical decision making (see chart for details).     Labs are unremarkable. CT pending and oncoming physician will follow up results.  Still no stool produced in the ED.  Final Clinical Impressions(s) / ED Diagnoses   Final diagnoses:  Diarrhea of presumed infectious origin    New Prescriptions Discharge Medication List as of 11/12/2016 11:17 AM       Tomasita Crumble, MD 11/29/16 1457

## 2016-11-29 NOTE — Telephone Encounter (Signed)
Margaret Vargas called and left a message for me Saturday indicating that she was more SOB and feeling bloated.  She also said that she had gained 6 lbs since discharge and was not urinating as much as she felt she should.  I called her back today as I was out of office yesterday.  She did seek care in the ED over the weekend and they increased her Lasix dose.  She says that she feels better currently and is aware of her appointment tomorrow.  She is weighing daily and watching fluid and sodium intake.  I encouraged her to call me back with any concerns regarding her HF going forward.

## 2016-11-30 ENCOUNTER — Ambulatory Visit (HOSPITAL_COMMUNITY)
Admission: RE | Admit: 2016-11-30 | Discharge: 2016-11-30 | Disposition: A | Discharge status: Home / Self Care | Payer: BLUE CROSS/BLUE SHIELD | Source: Ambulatory Visit | Attending: Cardiology | Admitting: Cardiology

## 2016-11-30 VITALS — BP 104/70 | HR 87 | Ht 69.5 in | Wt 293.6 lb

## 2016-11-30 DIAGNOSIS — I110 Hypertensive heart disease with heart failure: Secondary | ICD-10-CM | POA: Insufficient documentation

## 2016-11-30 DIAGNOSIS — I051 Rheumatic mitral insufficiency: Secondary | ICD-10-CM

## 2016-11-30 DIAGNOSIS — K219 Gastro-esophageal reflux disease without esophagitis: Secondary | ICD-10-CM | POA: Insufficient documentation

## 2016-11-30 DIAGNOSIS — Z8673 Personal history of transient ischemic attack (TIA), and cerebral infarction without residual deficits: Secondary | ICD-10-CM | POA: Insufficient documentation

## 2016-11-30 DIAGNOSIS — R110 Nausea: Secondary | ICD-10-CM | POA: Insufficient documentation

## 2016-11-30 DIAGNOSIS — Z0001 Encounter for general adult medical examination with abnormal findings: Secondary | ICD-10-CM | POA: Diagnosis not present

## 2016-11-30 DIAGNOSIS — Z7982 Long term (current) use of aspirin: Secondary | ICD-10-CM | POA: Insufficient documentation

## 2016-11-30 DIAGNOSIS — I5022 Chronic systolic (congestive) heart failure: Secondary | ICD-10-CM | POA: Insufficient documentation

## 2016-11-30 DIAGNOSIS — Z79899 Other long term (current) drug therapy: Secondary | ICD-10-CM | POA: Diagnosis not present

## 2016-11-30 DIAGNOSIS — Z87891 Personal history of nicotine dependence: Secondary | ICD-10-CM | POA: Insufficient documentation

## 2016-11-30 DIAGNOSIS — Z8249 Family history of ischemic heart disease and other diseases of the circulatory system: Secondary | ICD-10-CM | POA: Diagnosis not present

## 2016-11-30 DIAGNOSIS — I10 Essential (primary) hypertension: Secondary | ICD-10-CM

## 2016-11-30 DIAGNOSIS — F32A Depression, unspecified: Secondary | ICD-10-CM

## 2016-11-30 DIAGNOSIS — F329 Major depressive disorder, single episode, unspecified: Secondary | ICD-10-CM | POA: Insufficient documentation

## 2016-11-30 DIAGNOSIS — Z888 Allergy status to other drugs, medicaments and biological substances status: Secondary | ICD-10-CM | POA: Insufficient documentation

## 2016-11-30 DIAGNOSIS — F419 Anxiety disorder, unspecified: Secondary | ICD-10-CM | POA: Insufficient documentation

## 2016-11-30 NOTE — Progress Notes (Signed)
Advanced Heart Failure Clinic Note   Referring Physician: Dr. Eden Emms Primary Care: Mila Palmer, MD Primary Cardiologist: Dr Tenny Craw Primary HF: New  HPI:  Margaret Vargas is a 66 y.o. female with systolic CHF, Severe MR, HTN, Anxiety and depression.   Pt was admitted from 4/4 - 11/24/2016 with progressive DOE and wt gain.  Echo performed with EF 25-30% and severe MR. Diuresed with IV lasix 12.2L and transitioned to po.  TEE confirmed severe MR. Outpatient CVTS f/u arranged. Cath with normal coronaries, full report below.   Went to ED 11/26/16 with worsening SOB. Given dose of IV lasix and increased chronic dose to 40 mg po daily.   She presents today to establish in the HF clinic after recent admission. Had to go to ED as above.  Feels very "fragile" and anxious about condition.  SOB pushing her cart around the grocery store after about 10-15 minutes. She is tearful and worried that she did something to cause her condition. States she is always sick.  Is able to sleep laying flat. Still having occasional nausea. Struggles with GERD. Denies peripheral edema. Weight at home ~291-293. Drinks 2-3 water bottles and a cup of coffee. Using ms dash as salt substitute.  Exhausted after taking a shower.   TEE 11/22/16 LVEF 25-30%.  - Mitral valve with thickened and sclerotic leaflets with double-oreface closure, + 2 distinct regurgitant jets causing severe MR - Moderate TR  Echo 11/17/16 LVEF 25-30%, Trivial AI, Severe MR, Mod/Sev LAE, RV mild dilated, mild RAE, Moderate TR, PA peak pressure 64 mm Hg.   R/LHC 11/21/2016 - No angiographic CAD - Severe LV dysfunction (25% by visual estimate)  - Severe MR  RHC Procedural Findings: Hemodynamics (mmHg) RA mean 19 RV 57/11 PA 61/29 (43) PCWP 30 AO 105/71 Cardiac Output (Fick) 4.66 Cardiac Index (Fick) 1.89  Review of systems complete and found to be negative unless listed in HPI.    Past Medical History:  Diagnosis Date  . Acute CHF  (congestive heart failure) (HCC) 11/16/2016   [name] 11/16/2016  . Anxiety    [name] 11/16/2016  . Arthritis    "neck" (11/16/2016)  . Asthma   . Chronic bronchitis (HCC)   . Chronic neck pain   . Depression    [name] 11/16/2016  . Gout   . 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 Heart murmur    "when I was a child"  . High cholesterol   . History of blood transfusion    "w/a back OR"  . Hypertension    [name] 11/16/2016  . Pre-diabetes   . Sleep apnea    "need a mask/tests" (11/16/2016)  . Stroke [facility]) 05/2008   "mini stroke"; denies residual on 11/16/2016  . Thyroid nodule   . TIA (transient ischemic attack) "several"    Current Outpatient Prescriptions  Medication Sig Dispense Refill  . albuterol (PROVENTIL HFA;VENTOLIN HFA) 108 (90 Base) MCG/ACT inhaler Inhale 2 puffs into the lungs every 6 (six) hours as needed for wheezing or shortness of breath.    . allopurinol (ZYLOPRIM) 300 MG tablet Take 300 mg by mouth daily.    [name] Kitchen ALPRAZolam (XANAX) 0.25 MG tablet Take 0.125 mg by mouth at bedtime as needed for anxiety.    [name] Kitchen aspirin EC 81 MG tablet Take 81 mg by mouth every morning.     . cholecalciferol (VITAMIN D) 1000 UNITS tablet Take 1,000 Units by mouth daily.    . colchicine 0.6 MG tablet Take 0.6  mg by mouth daily as needed (FLAREUPS).    . Cyanocobalamin (VITAMIN B-12) 2500 MCG SUBL Place 2,500 mcg under the tongue daily.    [name] Kitchen escitalopram (LEXAPRO) 20 MG tablet Take 20 mg by mouth every morning.     . fluticasone (FLONASE) 50 MCG/ACT nasal spray Place 1 spray into both nostrils daily as needed for allergies or rhinitis.    . fluticasone furoate-vilanterol (BREO ELLIPTA) 200-25 MCG/INH AEPB Inhale 1 puff into the lungs daily.    . furosemide (LASIX) 20 MG tablet Take 40 mg by mouth daily.    [name] Kitchen levalbuterol (XOPENEX) 0.63 MG/3ML nebulizer solution Take 0.63 mg by nebulization every 4 (four) hours as needed for wheezing or shortness of breath.    . losartan (COZAAR) 25 MG tablet Take 1  tablet (25 mg total) by mouth daily. 30 tablet 0  . metoprolol succinate (TOPROL-XL) 25 MG 24 hr tablet Take 0.5 tablets (12.5 mg total) by mouth daily. 30 tablet 0  . omeprazole (PRILOSEC) 20 MG capsule Take 20 mg by mouth daily.    [name] Kitchen Propylene Glycol (SYSTANE BALANCE) 0.6 % SOLN Place 1 drop into both eyes 2 (two) times daily as needed (DRY EYES).     [name] Kitchen simvastatin (ZOCOR) 20 MG tablet Take 20 mg by mouth at bedtime.     . ondansetron (ZOFRAN ODT) 4 MG disintegrating tablet Take 1 tablet (4 mg total) by mouth every 8 (eight) hours as needed. (Patient not taking: Reported on 11/30/2016) 10 tablet 0   No current facility-administered medications for this encounter.     Allergies  Allergen Reactions  . Other Other (See Comments)    Bandaids and some adhesives cause red skin. (pls use paper tape)  . Adhesive [Tape] Itching and Rash  . Morphine And Related Itching      Social History   Social History  . Marital status: Married    Spouse name: N/A  . Number of children: N/A  . Years of education: N/A   Occupational History  . Not on file.   Social History Main Topics  . Smoking status: Former Smoker    Packs/day: 0.75    Years: 13.00    Types: Cigarettes    Quit date: 1989  . Smokeless tobacco: Never Used  . Alcohol use Yes     Comment: 11/16/2016 "might have 3-4 drinks/month"  . Drug use: No     Comment: 11/16/2016 "I smoked marijuana in college"  . Sexual activity: Not on file   Other Topics Concern  . Not on file   Social History Narrative  . No narrative on file      Family History  Problem Relation Age of Onset  . Hypertension Mother     Vitals:   11/30/16 1343  BP: 104/70  Pulse: 87  SpO2: 98%  Weight: 293 lb 9.6 oz (133.2 kg)  Height: 5' 9.5" (1.765 m)   Wt Readings from Last 3 Encounters:  11/30/16 293 lb 9.6 oz (133.2 kg)  11/23/16 295 lb 11.2 oz (134.1 kg)  01/10/14 300 lb (136.1 kg)    PHYSICAL EXAM: General:  Obese AA female. Tearful. NAD.    HEENT: Normal. Neck: supple. JVP difficult due to body habitus, but does not appear elevated currently. Carotids 2+ bilat; no bruits. No lymphadenopathy or thyromegaly appreciated. Cor: PMI nondisplaced. RRR. 3/6 MR.  Lungs: Mildly diminished basilar sounds.  Abdomen: Obese, soft, nontender, nondistended. No hepatosplenomegaly. No bruits or masses. Good bowel sounds. Extremities: No cyanosis, clubbing,  or rash. Trace ankle edema.  Neuro: Alert & oriented x 3, cranial nerves grossly intact. moves all 4 extremities w/o difficulty. Affect anxious and depressed, but pleasant overall.   ASSESSMENT & PLAN:  1. Chronic systolic CHF - Volume status looks OK. NYHA Class IIIb symptoms in setting of severe MR - Continue lasix 40 mg daily for now - Continue toprol XL 12.5 mg daily.  - Continue losartan 25 mg daily.  - No room for spiro with soft pressure and lightheadedness  - Reinforced fluid restriction to < 2 L daily, sodium restriction to less than 2000 mg daily, and the importance of daily weights.   2. Severe MR - Severe by TEE. Sees Dr. Cornelius Moras next week.  Very symptomatic with NYHA III - IIIb symptoms.  - Pt states she has a h/o of rheumatic fever - Will focus on diuresis from our end.  3. HTN - Stable on current regimen as above.  4. Anxiety/Depression - Per PCP. Very anxious and tearful in clinic today with these new diagnoses. Felt somewhat better after extended chat concerning her current medical problems and plans for the future.   Plan for surgical evaluation next week. With severity of MR and symptoms, suspect will need MVR. Will focus on volume for now, which seems to have improved after recent Lasix increase. No med change today.  Follow up 4 weeks with MD. Will have him review her chart further for any additional input.   Graciella Freer, PA-C 11/30/16   Greater than 50% of the 25 minute visit was spent in counseling/coordination of care regarding disease state education,  medication reconciliation, fluid/sodium restriction, and review of medication regimen with on site Pharm D.

## 2016-11-30 NOTE — Progress Notes (Signed)
Advanced Heart Failure Medication Review by a Pharmacist  Does the patient  feel that his/her medications are working for him/her?  yes  Has the patient been experiencing any side effects to the medications prescribed?  no  Does the patient measure his/her own blood pressure or blood glucose at home?  yes   Does the patient have any problems obtaining medications due to transportation or finances?   no  Understanding of regimen: good Understanding of indications: good Potential of compliance: good Patient understands to avoid NSAIDs. Patient understands to avoid decongestants.  Issues to address at subsequent visits: None   Pharmacist comments:  Margaret Vargas is a pleasant 66 yo F presenting with her husband and with good recall of her regimen. Patient was recently discharged from hospital and all medications have been reviewed. She reports good compliance with her regimen and did state that she increased her furosemide as directed after her last hospitalization. She is somewhat tearful as she states that she feels "fragile" since her diagnosis and is frustrated that even normal ADLs are difficult for her to do now. She did not have any other medication-related questions or concerns.   Tyler Deis. Bonnye Fava, PharmD, BCPS, CPP Clinical Pharmacist Pager: [phone] Phone: [phone] 11/30/2016 2:02 PM       Time with patient: 10 minutes Preparation and documentation time: 2 minutes Total time: 12 minutes

## 2016-11-30 NOTE — Patient Instructions (Signed)
Your physician recommends that you schedule a follow-up appointment in: 4-5 weeks with Dr Shirlee Latch

## 2016-11-30 NOTE — Progress Notes (Signed)
I spoke with patient briefly during her HF Clinic visit.  She remains quite anxious and "weepy" regarding her new HF diagnosis.  I reviewed that she seems to be doing well and called appropriately with weight gain and perceived decreased urine production.  She again says that she feels better with the new increased dose of Lasix.  I reinforced a low sodium diet and avoiding high sodium foods.  I encouraged her that we would continue to adjust medications and she should begin feeling better slowly.  I also encouraged her to call me with any further concerns /questions regarding HF.

## 2016-12-06 ENCOUNTER — Encounter: Payer: Self-pay | Admitting: Thoracic Surgery (Cardiothoracic Vascular Surgery)

## 2016-12-06 ENCOUNTER — Other Ambulatory Visit: Payer: Self-pay | Admitting: *Deleted

## 2016-12-06 ENCOUNTER — Institutional Professional Consult (permissible substitution) (INDEPENDENT_AMBULATORY_CARE_PROVIDER_SITE_OTHER): Payer: BLUE CROSS/BLUE SHIELD | Admitting: Thoracic Surgery (Cardiothoracic Vascular Surgery)

## 2016-12-06 VITALS — BP 123/72 | HR 85 | Resp 18 | Ht 69.5 in | Wt 295.0 lb

## 2016-12-06 DIAGNOSIS — I340 Nonrheumatic mitral (valve) insufficiency: Secondary | ICD-10-CM

## 2016-12-06 DIAGNOSIS — I7101 Dissection of thoracic aorta: Secondary | ICD-10-CM

## 2016-12-06 DIAGNOSIS — I428 Other cardiomyopathies: Secondary | ICD-10-CM | POA: Diagnosis not present

## 2016-12-06 DIAGNOSIS — I71019 Dissection of thoracic aorta, unspecified: Secondary | ICD-10-CM

## 2016-12-06 DIAGNOSIS — I5022 Chronic systolic (congestive) heart failure: Secondary | ICD-10-CM

## 2016-12-06 DIAGNOSIS — I7409 Other arterial embolism and thrombosis of abdominal aorta: Secondary | ICD-10-CM

## 2016-12-06 DIAGNOSIS — Z01818 Encounter for other preprocedural examination: Secondary | ICD-10-CM

## 2016-12-06 NOTE — Progress Notes (Signed)
[address]       Margaret Vargas [address]             [phone]     CARDIOTHORACIC SURGERY CONSULTATION REPORT  Referring Provider is Margaret Stade, MD  Primary Cardiologist is Margaret Riffle, MD PCP is Margaret Reeve, MD  Chief Complaint  Patient presents with  . Mitral Regurgitation    Surgical eval, TEE 11/22/16, Cardiac Cath 11/21/2016, ECHO 11/17/16    HPI:  Patient is a 66 year old morbidly obese African-American female with hypertension, obstructive sleep apnea, remote history of TIA, anxiety, asthma, chronic bronchitis, and depression who was recently diagnosed with acute on chronic combined systolic and diastolic congestive heart failure, mitral regurgitation who has been referred for elective surgical consultation. The patient states that she was diagnosed with rheumatic fever in her early 54s and noted to have a heart murmur at that time. She was told by her primary care physician that the heart murmur resolved and she has never previously been formally evaluated by a cardiologist. She describes a long history of progressive symptoms of exertional shortness of breath and fatigue dating back to last summer. Symptoms progressed fairly rapidly over the last several months, culminating in the patient's recent hospital admission for acute exacerbation of congestive heart failure with class IV symptoms and massive volume overload.  She was diuresed more than 12 L with some improvement in symptoms. Transthoracic echocardiogram revealed left ventricular ejection fraction estimated 25-30% with severe mitral regurgitation. She subsequently underwent TEE and diagnostic cardiac catheterization. TEE confirmed the presence of dilated cardiomyopathy with ejection fraction estimated 25-30%. There was mildly thickened and sclerotic leaflets of the mitral valve with severe mitral regurgitation. There was moderate left atrial enlargement. There was no thrombus in the left atrial  appendage. There was mild right ventricular chamber enlargement with moderate tricuspid regurgitation. Left and right heart catheterization revealed normal coronary arteries with no significant coronary artery disease.  The patient had moderate-severe pulmonary hypertension with PA pressures measured 61/29 and mean pulmonary Wedge pressure 30 with central venous pressure 19 mmHg.  The patient was discharged from the hospital and seen on one occasion in the advanced heart failure clinic. She has been referred for elective surgical consultation.  The patient is married and lives with her husband locally in [place]. 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 She has been morbidly obese for much of her adult life. Despite this she has remained reasonably active physically up until the past year or so. She describes progressive symptoms of exertional shortness of breath and fatigue which initially began last summer) progressed fairly rapidly over this past winter. By the time she was hospitalized recently the patient had resting shortness breath with orthopnea and lower extremity edema. She has never had any chest pain or chest tightness. Symptoms have improved somewhat on medical therapy, but since the patient was discharged home she has noticed that she has already been gaining some weight and her shortness of breath is getting worse. She denies any history of chest pain or chest tightness either with activity or at rest. She has recently been having abdominal bloating and frequent belching as well as sensation of distention or pressure across her upper abdomen. Her bowels have been functioning per her usual which includes some mild constipation and intermittent loose stool. She is quite anxious about her recent diagnosis.  Past Medical History:  Diagnosis Date  . Acute  CHF (congestive heart failure) (HCC) 11/16/2016   Margaret Vargas 11/16/2016  . Anxiety    Margaret Vargas  11/16/2016  . Arthritis    "neck" (11/16/2016)  . Asthma   . Chronic bronchitis (HCC)   . Chronic neck pain   . Depression    Margaret Vargas 11/16/2016  . Gout   . 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 Heart murmur    "when I was a child"  . High cholesterol   . History of blood transfusion    "w/a back OR"  . Hypertension    Margaret Vargas 11/16/2016  . Pre-diabetes   . Sleep apnea    "need a mask/tests" (11/16/2016)  . Stroke [facility]) 05/2008   "mini stroke"; denies residual on 11/16/2016  . Thyroid nodule   . TIA (transient ischemic attack) "several"    Past Surgical History:  Procedure Laterality Date  . BACK SURGERY    . INNER EAR SURGERY Right 1980s  . LAPAROSCOPIC CHOLECYSTECTOMY  1990s  . NEUROPLASTY / TRANSPOSITION MEDIAN NERVE AT CARPAL TUNNEL Left ~ 2010  . POSTERIOR FUSION LUMBAR SPINE  2005  . RIGHT OOPHORECTOMY Right 1990s  . RIGHT/LEFT HEART CATH AND CORONARY ANGIOGRAPHY N/A 11/21/2016   Procedure: Right/Left Heart Cath and Coronary Angiography;  Surgeon: Kathleene Hazel, MD;  Location: [facility] INVASIVE CV LAB;  Service: Cardiovascular;  Laterality: N/A;  . TEAR DUCT PROBING Bilateral 2000s  . TEE WITHOUT CARDIOVERSION N/A 11/22/2016   Procedure: TRANSESOPHAGEAL ECHOCARDIOGRAM (TEE);  Surgeon: Chrystie Nose, MD;  Location: [facility] ENDOSCOPY;  Service: Cardiovascular;  Laterality: N/A;  . VAGINAL HYSTERECTOMY  1989    Family History  Problem Relation Age of Onset  . Hypertension Mother     Social History   Social History  . Marital status: Married    Spouse name: N/A  . Number of children: N/A  . Years of education: N/A   Occupational History  . Not on file.   Social History Main Topics  . Smoking status: Former Smoker    Packs/day: 0.75    Years: 13.00    Types: Cigarettes    Quit date: 1989  . Smokeless tobacco: Never Used  . Alcohol use Yes     Comment: 11/16/2016 "might have 3-4 drinks/month"  . Drug use: No     Comment: 11/16/2016 "I smoked marijuana in college"  .  Sexual activity: Not on file   Other Topics Concern  . Not on file   Social History Narrative  . No narrative on file    Current Outpatient Prescriptions  Medication Sig Dispense Refill  . albuterol (PROVENTIL HFA;VENTOLIN HFA) 108 (90 Base) MCG/ACT inhaler Inhale 2 puffs into the lungs every 6 (six) hours as needed for wheezing or shortness of breath.    . allopurinol (ZYLOPRIM) 300 MG tablet Take 300 mg by mouth daily.    [name] Kitchen ALPRAZolam (XANAX) 0.25 MG tablet Take 0.125 mg by mouth at bedtime as needed for anxiety.    [name] Kitchen aspirin EC 81 MG tablet Take 81 mg by mouth every morning.     . cholecalciferol (VITAMIN D) 1000 UNITS tablet Take 1,000 Units by mouth daily.    . colchicine 0.6 MG tablet Take 0.6 mg by mouth daily as needed (FLAREUPS).    . Cyanocobalamin (VITAMIN B-12) 2500 MCG SUBL Place 2,500 mcg under the tongue daily.    [name] Kitchen escitalopram (LEXAPRO) 20 MG tablet Take 20 mg by mouth every morning.     . fluticasone (FLONASE) 50 MCG/ACT nasal  spray Place 1 spray into both nostrils daily as needed for allergies or rhinitis.    . fluticasone furoate-vilanterol (BREO ELLIPTA) 200-25 MCG/INH AEPB Inhale 1 puff into the lungs daily.    . furosemide (LASIX) 20 MG tablet Take 40 mg by mouth daily.    [name] Kitchen levalbuterol (XOPENEX) 0.63 MG/3ML nebulizer solution Take 0.63 mg by nebulization every 4 (four) hours as needed for wheezing or shortness of breath.    . losartan (COZAAR) 25 MG tablet Take 1 tablet (25 mg total) by mouth daily. 30 tablet 0  . metoprolol succinate (TOPROL-XL) 25 MG 24 hr tablet Take 0.5 tablets (12.5 mg total) by mouth daily. 30 tablet 0  . omeprazole (PRILOSEC) 20 MG capsule Take 20 mg by mouth daily.    [name] Kitchen Propylene Glycol (SYSTANE BALANCE) 0.6 % SOLN Place 1 drop into both eyes 2 (two) times daily as needed (DRY EYES).     [name] Kitchen simvastatin (ZOCOR) 20 MG tablet Take 20 mg by mouth at bedtime.     . ondansetron (ZOFRAN ODT) 4 MG disintegrating tablet Take 1 tablet (4 mg total) by  mouth every 8 (eight) hours as needed. (Patient not taking: Reported on 11/30/2016) 10 tablet 0   No current facility-administered medications for this visit.     Allergies  Allergen Reactions  . Other Other (See Comments)    Bandaids and some adhesives cause red skin. (pls use paper tape)  . Adhesive [Tape] Itching and Rash  . Morphine And Related Itching      Review of Systems:   General:  normal appetite, decreased energy, + weight gain, no weight loss, no fever  Cardiac:  no chest pain with exertion, no chest pain at rest, + SOB with exertion, + resting SOB, + PND, + orthopnea, no palpitations, no arrhythmia, no atrial fibrillation, + LE edema, no dizzy spells, no syncope  Respiratory:  + shortness of breath, no home oxygen, no productive cough, + intermittent dry cough, + chronic bronchitis, no wheezing, no hemoptysis, + asthma, no pain with inspiration or cough, + sleep apnea, no CPAP at night  GI:   no difficulty swallowing, no reflux, no frequent heartburn, no hiatal hernia, + abdominal pain, + constipation, no diarrhea, no hematochezia, no hematemesis, no melena  GU:   no dysuria,  no frequency, no urinary tract infection, no hematuria, no kidney stones, no kidney disease  Vascular:  no pain suggestive of claudication, no pain in feet, no leg cramps, no varicose veins, no DVT, no non-healing foot ulcer  Neuro:   no stroke, + TIA's, no seizures, no headaches, no temporary blindness one eye,  no slurred speech, no peripheral neuropathy, no chronic pain, no instability of gait, no memory/cognitive dysfunction  Musculoskeletal: + arthritis, no joint swelling, no myalgias, no difficulty walking, normal mobility   Skin:   no rash, no itching, no skin infections, no pressure sores or ulcerations  Psych:   + anxiety, + depression, + nervousness, + unusual recent stress  Eyes:   no blurry vision, + floaters, no recent vision changes, + wears glasses or contacts  ENT:   = hearing loss, no  loose or painful teeth, no dentures, last saw dentist approx 2 years ago  Hematologic:  no easy bruising, no abnormal bleeding, no clotting disorder, no frequent epistaxis  Endocrine:  no diabetes, does not check CBG's at home     Physical Exam:   BP 123/72 (BP Location: Right Arm, Patient Position: Sitting, Cuff Size: Large)  Pulse 85   Resp 18   Ht 5' 9.5" (1.765 m)   Wt 295 lb (133.8 kg)   SpO2 98% Comment: RA  BMI 42.94 kg/m   General:  Morbidly obese, o/w  well-appearing  HEENT:  Unremarkable   Neck:   no JVD, no bruits, no adenopathy   Chest:   clear to auscultation, symmetrical breath sounds, no wheezes, no rhonchi   CV:   RRR, soft systolic murmur   Abdomen:  soft, non-tender, no masses   Extremities:  warm, well-perfused, pulses diminished, no LE edema  Rectal/GU  Deferred  Neuro:   Grossly non-focal and symmetrical throughout  Skin:   Clean and dry, no rashes, no breakdown   Diagnostic Tests:  Transthoracic Echocardiography  Patient:    Margaret, Vargas MR #:       [id number] Study Date: 11/17/2016 Gender:     F Age:        49 Height:     175.3 cm Weight:     138.3 kg BSA:        2.67 m^2 Pt. Status: Room:       3E05C   PERFORMING   Georga Hacking, MD [facility], [place] [id number]  Anastasia Pall, Joya Martyr [id number]  ATTENDING    Melene Plan  SONOGRAPHER  Sheralyn Boatman  ADMITTING    Opyd, Marcial Pacas S  cc:  ------------------------------------------------------------------- LV EF: 25% -   30%  ------------------------------------------------------------------- Indications:      Dyspnea 786.09.  ------------------------------------------------------------------- History:   PMH:   Murmur.  Congestive heart failure.  Transient ischemic attack.  Risk factors:  Dyslipidemia.  ------------------------------------------------------------------- Study Conclusions  - Left ventricle: The cavity size was mildly dilated. Wall    thickness was increased in a pattern of mild LVH. Systolic   function was severely reduced. The estimated ejection fraction   was in the range of 25% to 30%. Dyskinesis of the   basal-midanteroseptal and anterior myocardium. Moderate   hypokinesis of the lateral and inferior myocardium. - Aortic valve: There was trivial regurgitation. - Mitral valve: Mildly calcified annulus. There was severe   regurgitation. - Left atrium: The atrium was moderately to severely dilated. - Right ventricle: The cavity size was mildly dilated. Wall   thickness was normal. - Right atrium: The atrium was dilated. - Tricuspid valve: There was moderate regurgitation. - Pulmonary arteries: Systolic pressure was moderately increased.   PA peak pressure: 64 mm Hg (S).  ------------------------------------------------------------------- Study data:  No prior study was available for comparison.  Study status:  Routine.  Procedure:  The patient reported no pain pre or post test. Transthoracic echocardiography. Image quality was adequate.  Study completion:  There were no complications. Transthoracic echocardiography.  M-mode, complete 2D, spectral Doppler, and color Doppler.  Birthdate:  Patient birthdate: 1951/02/07.  Age:  Patient is 66 yr old.  Sex:  Gender: female. BMI: 45 kg/m^2.  Blood pressure:     102/73  Patient status: Inpatient.  Study date:  Study date: 11/17/2016. Study time: 02:45 PM.  Location:  Bedside.  -------------------------------------------------------------------  ------------------------------------------------------------------- Left ventricle:  The cavity size was mildly dilated. Wall thickness was increased in a pattern of mild LVH. Systolic function was severely reduced. The estimated ejection fraction was in the range of 25% to 30%.  Regional wall motion abnormalities:   Dyskinesis of the basal-midanteroseptal and anterior myocardium.  Moderate hypokinesis of the lateral and  inferior myocardium.  ------------------------------------------------------------------- Aortic valve:  Trileaflet; normal thickness leaflets. Mobility was not restricted.  Doppler:  Transvalvular velocity was within the normal range. There was no stenosis. There was trivial regurgitation.  ------------------------------------------------------------------- Aorta:  Aortic root: The aortic root was normal in size.  ------------------------------------------------------------------- Mitral valve:   Mildly calcified annulus. Mobility was not restricted.  Doppler:  Transvalvular velocity was within the normal range. There was no evidence for stenosis. There was severe regurgitation.    Peak gradient (D): 11 mm Hg.  ------------------------------------------------------------------- Left atrium:  The atrium was moderately to severely dilated.   ------------------------------------------------------------------- Right ventricle:  The cavity size was mildly dilated. Wall thickness was normal. Systolic function was normal.  ------------------------------------------------------------------- Pulmonic valve:    Doppler:  Transvalvular velocity was within the normal range. There was no evidence for stenosis.  ------------------------------------------------------------------- Tricuspid valve:   Structurally normal valve.    Doppler: Transvalvular velocity was within the normal range. There was moderate regurgitation.  ------------------------------------------------------------------- Pulmonary artery:   The main pulmonary artery was normal-sized. Systolic pressure was moderately increased.  ------------------------------------------------------------------- Right atrium:  The atrium was dilated.  ------------------------------------------------------------------- Pericardium:  There was no pericardial  effusion.  ------------------------------------------------------------------- Systemic veins: Inferior vena cava: The vessel was dilated. Respirophasic changes in dimension were absent.  ------------------------------------------------------------------- Measurements   Left ventricle                        Value        Reference  LV ID, ED, PLAX chordal        (H)    61.1  mm     43 - 52  LV ID, ES, PLAX chordal        (H)    48    mm     23 - 38  LV fx shortening, PLAX chordal (L)    21    %      >=29  LV PW thickness, ED                   13.1  mm     ----------  IVS/LV PW ratio, ED                   0.77         <=1.3  Stroke volume, 2D                     47    ml     ----------  Stroke volume/bsa, 2D                 18    ml/m^2 ----------  LV e&', lateral                        5.36  cm/s   ----------  LV E/e&', lateral                      30.6         ----------  LV s&', lateral                        4.4   cm/s   ----------  LV e&', medial                         5.51  cm/s   ----------  LV E/e&', medial  29.76        ----------  LV e&', average                        5.44  cm/s   ----------  LV E/e&', average                      30.17        ----------  Longitudinal strain, TDI              10    %      ----------    Ventricular septum                    Value        Reference  IVS thickness, ED                     10.1  mm     ----------    LVOT                                  Value        Reference  LVOT ID, S                            20    mm     ----------  LVOT area                             3.14  cm^2   ----------  LVOT peak velocity, S                 101   cm/s   ----------  LVOT mean velocity, S                 57.2  cm/s   ----------  LVOT VTI, S                           15.1  cm     ----------    Aorta                                 Value        Reference  Aortic root ID, ED                    30    mm     ----------   Ascending aorta ID, A-P, S            32    mm     ----------    Left atrium                           Value        Reference  LA ID, A-P, ES                        47    mm     ----------  LA ID/bsa, A-P                        1.76  cm/m^2 <=2.2  LA volume, S                          86.8  ml     ----------  LA volume/bsa, S                      32.6  ml/m^2 ----------  LA volume, ES, 1-p A4C                76.5  ml     ----------  LA volume/bsa, ES, 1-p A4C            28.7  ml/m^2 ----------  LA volume, ES, 1-p A2C                99.6  ml     ----------  LA volume/bsa, ES, 1-p A2C            37.4  ml/m^2 ----------    Mitral valve                          Value        Reference  Mitral E-wave peak velocity           164   cm/s   ----------  Mitral deceleration time              222   ms     150 - 230  Mitral peak gradient, D               11    mm Hg  ----------  Mitral regurg VTI, PISA               154   cm     ----------  Mitral ERO, PISA                      0.46  cm^2   ----------  Mitral regurg volume, PISA            71    ml     ----------    Pulmonary arteries                    Value        Reference  PA pressure, S, DP             (H)    64    mm Hg  <=30    Tricuspid valve                       Value        Reference  Tricuspid regurg peak velocity        349   cm/s   ----------  Tricuspid peak RV-RA gradient         49    mm Hg  ----------    Right atrium                          Value        Reference  RA ID, S-I, ES, A4C            (H)    51.4  mm     34 - 49  RA area, ES, A4C               (H)  21.5  cm^2   8.3 - 19.5  RA volume, ES, A/L                    75.1  ml     ----------  RA volume/bsa, ES, A/L                28.2  ml/m^2 ----------    Systemic veins                        Value        Reference  Estimated CVP                         15    mm Hg  ----------    Right ventricle                       Value        Reference  TAPSE                                  17.3  mm     ----------  RV pressure, S, DP             (H)    64    mm Hg  <=30  RV s&', lateral, S                     7.51  cm/s   ----------  Legend: (L)  and  (H)  mark values outside specified reference range.  ------------------------------------------------------------------- Prepared and Electronically Authenticated by  Georga Hacking, MD [facility] 2018-04-05T16:03:28    Transesophageal Echocardiography  Patient:    Margaret, Vargas MR #:       [id number] Study Date: 11/22/2016 Gender:     F Age:        46 Height:     176.5 cm Weight:     133.5 kg BSA:        2.62 m^2 Pt. Status: Room:       3E05C   ORDERING     Charlton Haws, M.D.  REFERRING    Charlton Haws, M.D.  [facility] [place] [id number]  PERFORMING   Zoila Shutter MD  SONOGRAPHER  Leta Jungling, RDCS  ADMITTING    Odie Sera S  cc:  ------------------------------------------------------------------- LV EF: 25% -   30%  ------------------------------------------------------------------- Indications:      Mitral regurgitation 424.0.  ------------------------------------------------------------------- History:   PMH:  Left bundle Branch Block.  Murmur.  Coronary artery disease.  Congestive heart failure.  Transient ischemic attack.  Stroke.  Risk factors:  Hypertension. Dyslipidemia.  ------------------------------------------------------------------- Study Conclusions  - Left ventricle: There was mild concentric hypertrophy. Systolic   function was severely reduced. The estimated ejection fraction   was in the range of 25% to 30%. Diffuse hypokinesis. - Mitral valve: Thickened and sclerotic leaflets with   double-oreface closure, 2 distinct regurgitatant jets causing   severe regurgitation. - Left atrium: The atrium was moderately dilated. No evidence of   thrombus in the atrial cavity or appendage. - Right ventricle: Mildly dilated with mildly reduced systolic    function. - Right atrium: The atrium was dilated. - Atrial septum: No defect or patent foramen ovale was identified. - Tricuspid valve: Moderate regurgitation. Mildly thickened   leaflets.  Impressions:  -  Severe MR, moderate TR, LVEF 25-30% with global hypokinesis.  ------------------------------------------------------------------- Study data:   Study status:  Routine.  Consent:  The risks, benefits, and alternatives to the procedure were explained to the patient and informed consent was obtained.  Procedure:  The patient reported no pain pre or post test. Initial setup. The patient was brought to the laboratory. Surface ECG leads were monitored. Sedation. Conscious sedation was administered. Transesophageal echocardiography. Topical anesthesia was obtained using benzocaine spray and viscous lidocaine. A transesophageal probe was inserted by the attending cardiologistwithout difficulty. Image quality was adequate. Intravenous contrast (agitated saline) was administered. Study completion:  The patient tolerated the procedure well. There were no complications.  Administered medications:   Fentanyl, .  Midazolam, 4mg .          Diagnostic transesophageal echocardiography.  2D and color Doppler.  Birthdate:  Patient birthdate: 1950-08-26.  Age:  Patient is 66 yr old.  Sex:  Gender: female.    BMI: 42.9 kg/m^2.  Blood pressure:     107/60  Patient status:  Inpatient.  Study date:  Study date: 11/22/2016. Study time: 12:10 PM.  Location:  Endoscopy.  -------------------------------------------------------------------  ------------------------------------------------------------------- Left ventricle:  There was mild concentric hypertrophy. Systolic function was severely reduced. The estimated ejection fraction was in the range of 25% to 30%. Diffuse hypokinesis.  ------------------------------------------------------------------- Aortic valve:   Trileaflet.  Doppler:   There was no regurgitation.   ------------------------------------------------------------------- Aorta:  The aorta was normal, not dilated, and non-diseased.  ------------------------------------------------------------------- Mitral valve:  Thickened and sclerotic leaflets with double-oreface closure, 2 distinct regurgitatant jets causing severe regurgitation.  ------------------------------------------------------------------- Left atrium:  The atrium was moderately dilated.  No evidence of thrombus in the atrial cavity or appendage.  ------------------------------------------------------------------- Atrial septum:  No defect or patent foramen ovale was identified.   ------------------------------------------------------------------- Right ventricle:  Mildly dilated with mildly reduced systolic function.  ------------------------------------------------------------------- Pulmonic valve:    Doppler:  There was trivial regurgitation.  ------------------------------------------------------------------- Tricuspid valve:  Moderate regurgitation.  Mildly thickened leaflets.  ------------------------------------------------------------------- Right atrium:  The atrium was dilated.  ------------------------------------------------------------------- Pericardium:  There was no pericardial effusion.   ------------------------------------------------------------------- Post procedure conclusions Ascending Aorta:  - The aorta was normal, not dilated, and non-diseased.  ------------------------------------------------------------------- Measurements   Mitral valve                               Value  Mitral maximal regurg velocity, PISA       458   cm/s  Mitral regurg VTI, PISA                    147   cm  Mitral ERO, PISA                           0.16  cm^2  Mitral regurg volume, PISA                 24    ml    Tricuspid valve                            Value   Tricuspid regurg peak velocity             297   cm/s  Tricuspid peak RV-RA gradient              35    mm  Hg  Legend: (L)  and  (H)  mark values outside specified reference range.  ------------------------------------------------------------------- Prepared and Electronically Authenticated by  Zoila Shutter MD 2018-04-10T19:11:18   Right/Left Heart Cath and Coronary Angiography  Conclusion     There is severe left ventricular systolic dysfunction.  LV end diastolic pressure is moderately elevated.  The left ventricular ejection fraction is less than 25% by visual estimate.  There is moderate (3+) mitral regurgitation.  Hemodynamic findings consistent with moderate pulmonary hypertension.   1. No angiographic evidence of CAD 2. Severe LV systolic dysfunction 3. Severe mitral regurgitation  Recommendations: Would consider TEE to better define mitral valve disease. She will likely need surgical consideration for MV repair/replacement.    Indications   Non-ischemic cardiomyopathy (HCC) [I42.8 (ICD-10-CM)]  Acute systolic CHF (congestive heart failure) (HCC) [I50.21 (ICD-10-CM)]  Procedural Details/Technique   Technical Details Indication: 66 yo female with acute CHF found to have severe LV systolic dysfunction, severe MR.   Procedure: The risks, benefits, complications, treatment options, and expected outcomes were discussed with the patient. The patient and/or family concurred with the proposed plan, giving informed consent. The patient was brought to the cath lab after IV hydration was begun and oral premedication was given. The patient was further sedated with Versed and Fentanyl. The IV catheter present in the left antecubital vein was prepped and draped. I changed this out for a 5 [place] sheath. Right heart cath with balloon tipped catheter. The left wrist was prepped and draped in a sterile fashion. 1% lidocaine was used for local anesthesia. Using the modified  Seldinger access technique, a 5 French sheath was placed in the left radial artery. 3 mg Verapamil was given through the sheath. 6000 units IV heparin was given. Standard diagnostic catheters were used to perform selective coronary angiography. A pigtail catheter was used to perform a left ventricular angiogram. The sheath was removed from the left radial artery and a Terumo hemostasis band was applied at the arteriotomy site on the left wrist.     Estimated blood loss <50 mL.  During this procedure the patient was administered the following to achieve and maintain moderate conscious sedation: Versed 2 mg, Fentanyl 50 mcg, while the patient's heart rate, blood pressure, and oxygen saturation were continuously monitored. The period of conscious sedation was 35 minutes, of which I was present face-to-face 100% of this time.    Complications   Complications documented before study signed (11/21/2016 8:43 AM EDT)    RIGHT/LEFT HEART CATH AND CORONARY ANGIOGRAPHY   None Documented by Kathleene Hazel, MD 11/21/2016 8:32 AM EDT  Time Range: Intra-procedure      Coronary Findings   Dominance: Right  Left Anterior Descending  First Diagonal Branch  Vessel is moderate in size.  First Septal Branch  Vessel is small in size.  Second Diagonal Branch  Vessel is moderate in size.  Second Septal Branch  Vessel is small in size.  Third Diagonal Branch  Vessel is small in size.  Third Septal Branch  Vessel is small in size.  Left Circumflex  Vessel is large.  First Obtuse Marginal Branch  Vessel is large in size.  Second Obtuse Marginal Branch  Vessel is small in size.  Third Obtuse Marginal Branch  Vessel is moderate in size.  Right Coronary Artery  Vessel is large. Vessel is angiographically normal.  Right Heart   Right Heart Pressures Hemodynamic findings consistent with moderate pulmonary hypertension. Elevated LV EDP consistent with volume overload.  Wall Motion               Left Heart   Left Ventricle The left ventricle is dilated. There is severe left ventricular systolic dysfunction. LV end diastolic pressure is moderately elevated. The left ventricular ejection fraction is less than 25% by visual estimate. There are LV function abnormalities due to global hypokinesis. There is moderate (3+) mitral regurgitation.    Coronary Diagrams   Diagnostic Diagram       Implants     No implant documentation for this case.  PACS Images   Show images for Cardiac catheterization   Link to Procedure Log   Procedure Log    Hemo Data    Most Recent Value  Fick Cardiac Output 4.66 L/min  Fick Cardiac Output Index 1.89 (L/min)/BSA  RA A Wave 24 mmHg  RA V Wave 21 mmHg  RA Mean 19 mmHg  RV Systolic Pressure 57 mmHg  RV Diastolic Pressure 11 mmHg  RV EDP 24 mmHg  PA Systolic Pressure 61 mmHg  PA Diastolic Pressure 29 mmHg  PA Mean 43 mmHg  PW A Wave 35 mmHg  PW V Wave 39 mmHg  PW Mean 30 mmHg  AO Systolic Pressure 105 mmHg  AO Diastolic Pressure 71 mmHg  AO Mean 85 mmHg  LV Systolic Pressure 106 mmHg  LV Diastolic Pressure 16 mmHg  LV EDP 23 mmHg  Arterial Occlusion Pressure Extended Systolic Pressure 109 mmHg  Arterial Occlusion Pressure Extended Diastolic Pressure 71 mmHg  Arterial Occlusion Pressure Extended Mean Pressure 88 mmHg  Left Ventricular Apex Extended Systolic Pressure 109 mmHg  Left Ventricular Apex Extended Diastolic Pressure 14 mmHg  Left Ventricular Apex Extended EDP Pressure 27 mmHg  QP/QS 1  TPVR Index 22.69 HRUI  TSVR Index 44.85 HRUI  PVR SVR Ratio 0.2  TPVR/TSVR Ratio 0.51      Impression:  Patient has stage D severe symptomatic mitral regurgitation and presents with progressive symptoms of exertional shortness of breath, fatigue, orthopnea, PND, and lower extremity edema with recent acute exacerbation of 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 She has a reported history of  rheumatic heart disease in the remote past.   I have personally reviewed the patient's recent transthoracic and transesophageal echocardiograms and diagnostic cardiac catheterization. Echocardiograms revealed presence of dilated nonischemic cardiomyopathy with severe left ventricular systolic dysfunction and ejection fraction estimated 25-30% in the setting of severe mitral regurgitation. The mitral valve leaflets are mild to moderately thickened but overall do not have classical features of rheumatic disease. There is no mitral stenosis. Leaflet function is definitely restricted at least during systole because of downward displacement of the mitral apparatus. 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 Diagnostic cardiac catheterization is notable for the absence of significant coronary artery disease. The patient has moderate to severe pulmonary hypertension and moderate tricuspid regurgitation.  If one assumes that her mitral regurgitation is secondary to nonischemic cardiomyopathy, it might be reasonable to attempt aggressive medical therapy before committing her to surgery.  However, the patient has not been doing well since hospital discharge and already seems to be developing worsening fluid overload.   I suspect the patient would best be treated with elective mitral valve repair or replacement and possible tricuspid valve repair.    Plan:  I have discussed the nature of the patient's underlying valvular heart disease and nonischemic cardiomyopathy at length with the patient  and her husband in the office today. We compared and contrasted indications for surgical intervention for patients with primary mitral regurgitation such as that caused by rheumatic heart disease versus patients with secondary mitral regurgitation and the role of medical therapy. Surgical options were discussed in detail. The patient is  quite anxious and does not wish to proceed with surgery in the near future unless it is absolutely necessary.  I suspect that she will need surgery, probably sooner rather than later. However, under the circumstances we will make sure that she is seen in follow-up in the advanced heart failure clinic as soon as possible to readjust her heart failure medications. We will plan to see the patient back in follow-up in approximately 4 weeks and consider making plans for surgical intervention depending upon how she is doing at that time. During the interval period of time she will undergo CT angiography to evaluate the feasibility of peripheral arterial cannulation for surgery.  All of her questions have been addressed.   I spent in excess of 90 minutes during the conduct of this office consultation and >50% of this time involved direct face-to-face encounter with the patient for counseling and/or coordination of their care.    Salvatore Decent. Cornelius Moras, MD 12/06/2016 3:11 PM

## 2016-12-06 NOTE — Patient Instructions (Signed)
Continue all previous medications without any changes at this time  

## 2016-12-07 ENCOUNTER — Encounter: Payer: Self-pay | Admitting: *Deleted

## 2016-12-07 ENCOUNTER — Telehealth: Payer: Self-pay | Admitting: *Deleted

## 2016-12-07 ENCOUNTER — Encounter: Payer: BLUE CROSS/BLUE SHIELD | Admitting: Thoracic Surgery (Cardiothoracic Vascular Surgery)

## 2016-12-07 ENCOUNTER — Other Ambulatory Visit: Payer: Self-pay | Admitting: *Deleted

## 2016-12-07 MED ORDER — FUROSEMIDE 40 MG PO TABS: 40.0000 mg | ORAL_TABLET | Freq: Every day | ORAL | 3 refills | Status: DC

## 2016-12-07 NOTE — Telephone Encounter (Deleted)
-----   Message from Pricilla Riffle, MD sent at 12/07/2016 11:53 AM EDT -----   ----- Message ----- From: Purcell Nails, MD Sent: 12/06/2016   6:37 PM To: Dolores Patty, MD, Laurey Morale, MD, #  I think this patient needs to be seen back in the AHF clinic w/in a week or two for adjustment of CHF meds.  She may have secondary MR but probably at least partially primary MR due to rheumatic disease.  She probably needs MVR sooner rather than later but she remains reluctant.  Please take a look at her TEE and cath and get her back in clinic.  Otherwise she will wind up in ED again.  Thanks.  Cub

## 2016-12-07 NOTE — Progress Notes (Signed)
Please add pt to my sched next Mon or Friday

## 2016-12-07 NOTE — Telephone Encounter (Signed)
Message from Dr. Tenny Craw to add patient to her schedule on 4/30 or 12/16/16.     Called patient and scheduled for 12/16/16.  She had 2 concerns:  Her lasix dose was increased to 2 pills a day (40 mg) and she is running out today and it is too soon to refill per her insurance company. --I send a new prescription for lasix 40 mg tablets to [facility].  She will pick up today.  She gained 4 pounds since yesterday.  Took 40 mg lasix and currently is down 2 pounds from this am.  She slept sitting up last night and her SOB "is not as bad as when I went to the hospital but I am SOB".  Yesterday she ate salad with low sodium dressing, a bagel and 1 piece of [place] bacon.     I asked her to call me tomorrow morning with her weight and how she is feeling.   I also advised I am sending the information to Dr. Tenny Craw and if she has further recommendations for today someone from triage will call her back.

## 2016-12-08 NOTE — Progress Notes (Signed)
Appointment with Dr. Tenny Craw on 5/4.  See telephone note 12/07/16.

## 2016-12-08 NOTE — Telephone Encounter (Signed)
Patient weight today is 293.8.   This is 3.2 pounds less than yesterday.   She slept lying down most of last night and is less SOB today.  Picked up new furosemide prescription yesterday.  Advised her to call if any concerns prior to appointment 5/4.

## 2016-12-08 NOTE — Progress Notes (Signed)
She was seen by Thoracic surgery this week and needs HF med adjustment.  Could one of you all please call her and add her on my side next week??   OK to give a 1200 appointment or an 830 if the other spots are filling up.   Thanks!   Casimiro Needle [address]" [place], PA-C 12/08/2016 7:29 AM

## 2016-12-12 ENCOUNTER — Ambulatory Visit (HOSPITAL_COMMUNITY)
Admission: RE | Admit: 2016-12-12 | Discharge: 2016-12-12 | Disposition: A | Discharge status: Home / Self Care | Payer: BLUE CROSS/BLUE SHIELD | Source: Ambulatory Visit | Attending: Cardiology | Admitting: Cardiology

## 2016-12-12 ENCOUNTER — Encounter (HOSPITAL_COMMUNITY): Payer: Self-pay

## 2016-12-12 VITALS — BP 139/77 | HR 91 | Wt 293.8 lb

## 2016-12-12 DIAGNOSIS — G4733 Obstructive sleep apnea (adult) (pediatric): Secondary | ICD-10-CM | POA: Insufficient documentation

## 2016-12-12 DIAGNOSIS — I447 Left bundle-branch block, unspecified: Secondary | ICD-10-CM | POA: Diagnosis not present

## 2016-12-12 DIAGNOSIS — F329 Major depressive disorder, single episode, unspecified: Secondary | ICD-10-CM | POA: Diagnosis not present

## 2016-12-12 DIAGNOSIS — Z79899 Other long term (current) drug therapy: Secondary | ICD-10-CM | POA: Insufficient documentation

## 2016-12-12 DIAGNOSIS — Z7982 Long term (current) use of aspirin: Secondary | ICD-10-CM | POA: Insufficient documentation

## 2016-12-12 DIAGNOSIS — I429 Cardiomyopathy, unspecified: Secondary | ICD-10-CM | POA: Diagnosis not present

## 2016-12-12 DIAGNOSIS — I051 Rheumatic mitral insufficiency: Secondary | ICD-10-CM | POA: Diagnosis not present

## 2016-12-12 DIAGNOSIS — I5022 Chronic systolic (congestive) heart failure: Secondary | ICD-10-CM | POA: Insufficient documentation

## 2016-12-12 DIAGNOSIS — R110 Nausea: Secondary | ICD-10-CM | POA: Insufficient documentation

## 2016-12-12 DIAGNOSIS — I099 Rheumatic heart disease, unspecified: Secondary | ICD-10-CM | POA: Diagnosis not present

## 2016-12-12 DIAGNOSIS — F419 Anxiety disorder, unspecified: Secondary | ICD-10-CM | POA: Insufficient documentation

## 2016-12-12 LAB — BASIC METABOLIC PANEL
Anion gap: 9 (ref 5–15)
BUN: 15 mg/dL (ref 6–20)
CO2: 27 mmol/L (ref 22–32)
Calcium: 9.2 mg/dL (ref 8.9–10.3)
Chloride: 105 mmol/L (ref 101–111)
Creatinine, Ser: 0.94 mg/dL (ref 0.44–1.00)
GFR calc Af Amer: >60 mL/min (ref >60)
GFR calc non Af Amer: >60 mL/min (ref >60)
GLUCOSE: 133 mg/dL — AB (ref 65–99)
Potassium: 4.1 mmol/L (ref 3.5–5.1)
Sodium: 141 mmol/L (ref 135–145)

## 2016-12-12 LAB — BRAIN NATRIURETIC PEPTIDE: B Natriuretic Peptide: 599 pg/mL — ABNORMAL HIGH (ref 0.0–100.0)

## 2016-12-12 MED ORDER — POTASSIUM CHLORIDE ER 20 MEQ PO TBCR: 10.0000 meq | EXTENDED_RELEASE_TABLET | Freq: Every day | ORAL | 3 refills | Status: DC

## 2016-12-12 MED ORDER — FUROSEMIDE 40 MG PO TABS: 40.0000 mg | ORAL_TABLET | Freq: Two times a day (BID) | ORAL | 3 refills | Status: DC

## 2016-12-12 MED ORDER — SPIRONOLACTONE 25 MG PO TABS: 12.5000 mg | ORAL_TABLET | Freq: Every day | ORAL | 3 refills | Status: DC

## 2016-12-12 MED ORDER — DIGOXIN 125 MCG PO TABS: 0.1250 mg | ORAL_TABLET | Freq: Every day | ORAL | 3 refills | Status: DC

## 2016-12-12 NOTE — Patient Instructions (Signed)
Labs today (will call for abnormal results, otherwise no news is good news)  INCREASE Lasix to 40 mg (1 Tablet) Two Times Daily  START taking Potassium 20 mEq (1 Tablet) Once Daily  START Spironolactone 12.5 mg (0.5 Tablet) Once Daily in the evening.  START taking Digoxin 0.125 mg (1 Tablet) Once Daily  I have canceled your appointment with Dr. Tenny Craw this Friday, you will need to call and reschedule this later after your surgery.   Follow up end of next week with Dr. Shirlee Latch.

## 2016-12-13 NOTE — Progress Notes (Signed)
PCP: Dr. Paulino Rily HF Cardiology: Dr. Shirlee Latch  66 yo with history of suspected rheumatic fever, nonischemic cardiomyopathy, and severe mitral regurgitation presents for CHF followup.  Patient had an episode in childhood that was thought to be rheumatic fever.  She had a murmur as a child, but never had an echocardiogram.  Starting last summer, she developed exertional dyspnea. This has been progressive since that time.  She never saw a cardiologist until she was admitted in 4/18 with worsening dyspnea and weight gain.  Weight increased about 35 lbs from last summer.   She was diuresed in the hospital and had an echo and a TEE, showing EF 25-30% with rheumatic-appearing mitral valve and severe mitral regurgitation.  She has been seen by Dr Barry Dienes for evaluation for surgical repair/replacement of the MV.   Since discharge, symptoms have been up and down.  She gets nauseated 4-5 times a week.  This has been going on for several months.  She has "mucusy" BMs. She is still limited in terms of activity.  She gets very short of breath after walking 25-30 feet and has to stop. Occasional lightheadedness with standing though BP is on the high side today.  SBP runs 120-140 at home.  She has orthopnea and has to sleep sitting up.  No chest pain.  She has generalized fatigue.   ECG (4/18, personally reviewed): NSR, LBBB  Labs (4/18): BNP 698, K 4.3, creatinine 1.13  PMH: 1. OSA 2. LBBB 3. h/o C difficile 4. Rheumatic fever as a child with possible rheumatic heart disease and severe MR.  - Echo (4/18): EF 25-30%, mild RV dilation with mildly decreased RV systolic function, moderate TR, thickened MV leaflets with 2 MR jets (2 defined areas of malcoaptation), severe MR.   - TEE (4/18): EF 25-30%, mild LV dilation, confirms severe MR with the above features.  5. Chronic systolic CHF: Nonischemic, prior myocarditis + valvular, versus all valvular.  - RHC/LHC (4/18): No significant CAD, severe MR.  Mean RA 18, PA  61/29, mean PCWP 30, CI 1.89.  6. Anxiety/depression  SH: Married, lives in [place], retired Air[employer] pilot, daughter lives in [place], nonsmoker, no ETOH.   FH: Mother with sudden cardiac death at 74.   ROS: All systems reviewed and negative except as per HPI.   Current Outpatient Prescriptions  Medication Sig Dispense Refill  . albuterol (PROVENTIL HFA;VENTOLIN HFA) 108 (90 Base) MCG/ACT inhaler Inhale 2 puffs into the lungs every 6 (six) hours as needed for wheezing or shortness of breath.    . allopurinol (ZYLOPRIM) 300 MG tablet Take 300 mg by mouth daily.    [name] Kitchen ALPRAZolam (XANAX) 0.25 MG tablet Take 0.125 mg by mouth at bedtime as needed for anxiety.    [name] Kitchen aspirin EC 81 MG tablet Take 81 mg by mouth every morning.     . cholecalciferol (VITAMIN D) 1000 UNITS tablet Take 1,000 Units by mouth daily.    . Cyanocobalamin (VITAMIN B-12) 2500 MCG SUBL Place 2,500 mcg under the tongue daily.    [name] Kitchen escitalopram (LEXAPRO) 20 MG tablet Take 20 mg by mouth every morning.     . fluticasone (FLONASE) 50 MCG/ACT nasal spray Place 1 spray into both nostrils daily as needed for allergies or rhinitis.    . fluticasone furoate-vilanterol (BREO ELLIPTA) 200-25 MCG/INH AEPB Inhale 1 puff into the lungs daily.    . furosemide (LASIX) 40 MG tablet Take 1 tablet (40 mg total) by mouth 2 (two) times daily. 60 tablet 3  .  levalbuterol (XOPENEX) 0.63 MG/3ML nebulizer solution Take 0.63 mg by nebulization every 4 (four) hours as needed for wheezing or shortness of breath.    . losartan (COZAAR) 25 MG tablet Take 1 tablet (25 mg total) by mouth daily. 30 tablet 0  . metoprolol succinate (TOPROL-XL) 25 MG 24 hr tablet Take 0.5 tablets (12.5 mg total) by mouth daily. 30 tablet 0  . omeprazole (PRILOSEC) 20 MG capsule Take 20 mg by mouth daily.    [name] Kitchen Propylene Glycol (SYSTANE BALANCE) 0.6 % SOLN Place 1 drop into both eyes 2 (two) times daily as needed (DRY EYES).     [name] Kitchen simvastatin (ZOCOR) 20 MG tablet Take 20 mg by  mouth at bedtime.     . colchicine 0.6 MG tablet Take 0.6 mg by mouth daily as needed (FLAREUPS).    [name] Kitchen digoxin (LANOXIN) 0.125 MG tablet Take 1 tablet (0.125 mg total) by mouth daily. 30 tablet 3  . ondansetron (ZOFRAN ODT) 4 MG disintegrating tablet Take 1 tablet (4 mg total) by mouth every 8 (eight) hours as needed. (Patient not taking: Reported on 11/30/2016) 10 tablet 0  . potassium chloride 20 MEQ TBCR Take 10 mEq by mouth daily. 30 tablet 3  . spironolactone (ALDACTONE) 25 MG tablet Take 0.5 tablets (12.5 mg total) by mouth daily. 20 tablet 3   No current facility-administered medications for this encounter.    BP 139/77   Pulse 91   Wt 293 lb 12 oz (133.2 kg)   SpO2 99%   BMI 42.76 kg/m  General: NAD Neck: JVP 14-16 cm, no thyromegaly or thyroid nodule.  Lungs: Clear to auscultation bilaterally with normal respiratory effort. CV: Nondisplaced PMI.  Heart regular S1/S2, no S3/S4, 3/6 HSM at apex.  No peripheral edema.  No carotid bruit.  Normal pedal pulses.  Abdomen: Soft, nontender, no hepatosplenomegaly, no distention.  Skin: Intact without lesions or rashes.  Neurologic: Alert and oriented x 3.  Psych: Normal affect. Extremities: No clubbing or cyanosis.  HEENT: Normal.   Assessment/Plan: 1. Chronic systolic CHF: EF 38-75% on echo in 4/18, nonischemic cardiomyopathy.  No prior echo.  She has severe mitral regurgitation and a rheumatic-appearing mitral valve.  It is hard to know which was the initial problem (cardiomyopathy or mitral regurgitation), and hard to know how related they are.  Cannot rule out valvular cardiomyopathy due to long-term severe mitral regurgitation (insidious onset of symptoms almost a year ago, and had never had an echo).  Alternatively, she could have a nonischemic cardiomyopathy from some other etiology like myocarditis and have a co-existing rheumatic mitral valve with perhaps worsening of MR from annular dilatation (secondary MR). On exam today, she is  quite volume overloaded.  NYHA class IIIb symptoms.   - Continue losartan and Toprol XL at current doses.  - Needs more diuresis => increase Lasix to 40 mg bid.  BMET/BNP today and again in 1 week.  - Add spironolactone 12.5 mg daily.  - Add digoxin 0.125 daily given marginal cardiac output on recent RHC.  - Add KCl 20 daily.  - Repeat RHC when meds and volume status optimized.   - Eventually would transition losartan to Entresto and add Bidil.  2. Rheumatic heart disease: Severe MR with a rheumatic-appearing valve, also moderate TR.  There are 2 jets of MR from 2 defined areas of malcoaptation.  As above, do not know if there is a co-existing nonischemic cardiomyopathy or if long-standing severe MR led to a cardiomyopathy.  Mitral regurgitation  is likely mixed => rheumatic mitral valve disease but also concern for a component of secondary MR from annular dilatation.  Not straightforward situation, would be high risk for repair/replacement of MV currently as she is not well-compensated.  - Medical management as above.  - Would repeat echo versus TEE after optimal medical management.  If MR remains severe and she is medically stabilized, will need to discuss valve surgery with Dr. Cornelius Moras.  3. LBBB: If EF remains low, she will likely be a CRT-D candidate. 4. Nausea: CHF could play a role in this, may improve with diuresis.  However, reasonable for her to followup with her GI MD.    Marca Ancona 12/13/2016

## 2016-12-16 ENCOUNTER — Ambulatory Visit: Payer: BLUE CROSS/BLUE SHIELD | Admitting: Internal Medicine

## 2016-12-19 ENCOUNTER — Telehealth (HOSPITAL_COMMUNITY): Payer: Self-pay | Admitting: *Deleted

## 2016-12-19 NOTE — Telephone Encounter (Signed)
Patient called in over the weekend complaining of extreme nausea and constipation.   I called her back today to follow up with her and she stated that she had an appointment with her gastroenterologist tomorrow for her constipation/gas pains.  No further questions at this time.

## 2016-12-23 ENCOUNTER — Telehealth (HOSPITAL_COMMUNITY): Payer: Self-pay | Admitting: Pharmacist

## 2016-12-23 ENCOUNTER — Encounter (HOSPITAL_COMMUNITY): Payer: Self-pay

## 2016-12-23 ENCOUNTER — Ambulatory Visit (HOSPITAL_COMMUNITY)
Admission: RE | Admit: 2016-12-23 | Discharge: 2016-12-23 | Disposition: A | Discharge status: Home / Self Care | Payer: BLUE CROSS/BLUE SHIELD | Source: Ambulatory Visit | Attending: Cardiology | Admitting: Cardiology

## 2016-12-23 VITALS — BP 128/74 | HR 67 | Ht 69.5 in | Wt 281.2 lb

## 2016-12-23 DIAGNOSIS — Z7951 Long term (current) use of inhaled steroids: Secondary | ICD-10-CM | POA: Insufficient documentation

## 2016-12-23 DIAGNOSIS — I099 Rheumatic heart disease, unspecified: Secondary | ICD-10-CM | POA: Insufficient documentation

## 2016-12-23 DIAGNOSIS — F329 Major depressive disorder, single episode, unspecified: Secondary | ICD-10-CM | POA: Diagnosis not present

## 2016-12-23 DIAGNOSIS — I447 Left bundle-branch block, unspecified: Secondary | ICD-10-CM | POA: Diagnosis not present

## 2016-12-23 DIAGNOSIS — Z7982 Long term (current) use of aspirin: Secondary | ICD-10-CM | POA: Diagnosis not present

## 2016-12-23 DIAGNOSIS — I429 Cardiomyopathy, unspecified: Secondary | ICD-10-CM | POA: Diagnosis not present

## 2016-12-23 DIAGNOSIS — I051 Rheumatic mitral insufficiency: Secondary | ICD-10-CM | POA: Diagnosis not present

## 2016-12-23 DIAGNOSIS — I428 Other cardiomyopathies: Secondary | ICD-10-CM | POA: Diagnosis not present

## 2016-12-23 DIAGNOSIS — R062 Wheezing: Secondary | ICD-10-CM | POA: Insufficient documentation

## 2016-12-23 DIAGNOSIS — G4733 Obstructive sleep apnea (adult) (pediatric): Secondary | ICD-10-CM | POA: Insufficient documentation

## 2016-12-23 DIAGNOSIS — I5022 Chronic systolic (congestive) heart failure: Secondary | ICD-10-CM | POA: Diagnosis not present

## 2016-12-23 DIAGNOSIS — F419 Anxiety disorder, unspecified: Secondary | ICD-10-CM | POA: Diagnosis not present

## 2016-12-23 DIAGNOSIS — I340 Nonrheumatic mitral (valve) insufficiency: Secondary | ICD-10-CM | POA: Insufficient documentation

## 2016-12-23 LAB — BASIC METABOLIC PANEL
ANION GAP: 10 (ref 5–15)
BUN: 10 mg/dL (ref 6–20)
CO2: 26 mmol/L (ref 22–32)
Calcium: 9.2 mg/dL (ref 8.9–10.3)
Chloride: 105 mmol/L (ref 101–111)
Creatinine, Ser: 1.03 mg/dL — ABNORMAL HIGH (ref 0.44–1.00)
GFR calc Af Amer: >60 mL/min (ref >60)
GFR, EST NON AFRICAN AMERICAN: 56 mL/min — AB (ref >60)
Glucose, Bld: 125 mg/dL — ABNORMAL HIGH (ref 65–99)
POTASSIUM: 3.5 mmol/L (ref 3.5–5.1)
SODIUM: 141 mmol/L (ref 135–145)

## 2016-12-23 LAB — BRAIN NATRIURETIC PEPTIDE: B NATRIURETIC PEPTIDE 5: 869.6 pg/mL — AB (ref 0.0–100.0)

## 2016-12-23 LAB — DIGOXIN LEVEL: Digoxin Level: 0.9 ng/mL (ref 0.8–2.0)

## 2016-12-23 MED ORDER — SACUBITRIL-VALSARTAN 24-26 MG PO TABS: 1.0000 | ORAL_TABLET | Freq: Two times a day (BID) | ORAL | 3 refills | Status: DC

## 2016-12-23 NOTE — Patient Instructions (Addendum)
Stop Losartan   Start Entresto 24/26 mg Twice daily   Labs today  Your physician recommends that you schedule a follow-up appointment in: 2 weeks with Fara Chute D  Your physician recommends that you schedule a follow-up appointment in: 1 month

## 2016-12-23 NOTE — Telephone Encounter (Signed)
Entresto 24-26 mg BID PA approved by [employer] through 12/23/17.   Tyler Deis. Bonnye Fava, PharmD, BCPS, CPP Clinical Pharmacist Pager: [phone] Phone: [phone] 12/23/2016 2:33 PM

## 2016-12-24 ENCOUNTER — Encounter (HOSPITAL_COMMUNITY): Payer: Self-pay | Admitting: Cardiology

## 2016-12-24 NOTE — Progress Notes (Signed)
PCP: Dr. Paulino Rily HF Cardiology: Dr. Shirlee Latch  66 yo with history of suspected rheumatic fever, nonischemic cardiomyopathy, and severe mitral regurgitation presents for CHF followup.  Patient had an episode in childhood that was thought to be rheumatic fever.  She had a murmur as a child, but never had an echocardiogram.  Starting last summer, she developed exertional dyspnea. This has been progressive since that time.  She never saw a cardiologist until she was admitted in 4/18 with worsening dyspnea and weight gain.  Weight increased about 35 lbs from last summer.   She was diuresed in the hospital and had an echo and a TEE, showing EF 25-30% with rheumatic-appearing mitral valve and severe mitral regurgitation.  She has been seen by Dr Barry Dienes for evaluation for surgical repair/replacement of the MV.   When I saw her initially in 4/18, she was markedly volume overloaded and symptomatic.  She also had abdominal discomfort and nausea.  I adjusted her meds with an increase in Lasix. She is now breathing much better.  Weight is down 12 lbs.  No dyspnea walking on flat ground now.  No orthopnea/PND. She is still short of breath walking up a hill.  She was able to go to the movies with friends yesterday.  Next week plans to start back walking in the park.  She saw her GI MD as well, was started on rifaximin (per her description, sounds like he thought that she had small bowel bacterial overgrowth).  She no longer has abdominal pain or nausea.   ECG: NSR, LBBB  Labs (4/18): BNP 698, K 4.3, creatinine 1.13 => 0.94, BNP 599  PMH: 1. OSA 2. LBBB 3. h/o C difficile 4. Rheumatic fever as a child with possible rheumatic heart disease and severe MR.  - Echo (4/18): EF 25-30%, mild RV dilation with mildly decreased RV systolic function, moderate TR, thickened MV leaflets with 2 MR jets (2 defined areas of malcoaptation), severe MR.   - TEE (4/18): EF 25-30%, mild LV dilation, confirms severe MR with the above  features.  5. Chronic systolic CHF: Nonischemic, prior myocarditis + valvular, versus all valvular.  - RHC/LHC (4/18): No significant CAD, severe MR.  Mean RA 18, PA 61/29, mean PCWP 30, CI 1.89.  6. Anxiety/depression  SH: Married, lives in [place], retired Air[employer] pilot, daughter lives in [place], nonsmoker, no ETOH.   FH: Mother with sudden cardiac death at 26.   ROS: All systems reviewed and negative except as per HPI.   Current Outpatient Prescriptions  Medication Sig Dispense Refill  . albuterol (PROVENTIL HFA;VENTOLIN HFA) 108 (90 Base) MCG/ACT inhaler Inhale 2 puffs into the lungs every 6 (six) hours as needed for wheezing or shortness of breath.    . allopurinol (ZYLOPRIM) 300 MG tablet Take 300 mg by mouth daily.    [name] Kitchen ALPRAZolam (XANAX) 0.25 MG tablet Take 0.125 mg by mouth at bedtime as needed for anxiety.    [name] Kitchen aspirin EC 81 MG tablet Take 81 mg by mouth every morning.     . cholecalciferol (VITAMIN D) 1000 UNITS tablet Take 1,000 Units by mouth daily.    . colchicine 0.6 MG tablet Take 0.6 mg by mouth daily as needed (FLAREUPS).    . Cyanocobalamin (VITAMIN B-12) 2500 MCG SUBL Place 2,500 mcg under the tongue daily.    . digoxin (LANOXIN) 0.125 MG tablet Take 1 tablet (0.125 mg total) by mouth daily. 30 tablet 3  . escitalopram (LEXAPRO) 20 MG tablet Take 20 mg by mouth  every morning.     . fluticasone (FLONASE) 50 MCG/ACT nasal spray Place 1 spray into both nostrils daily as needed for allergies or rhinitis.    . fluticasone furoate-vilanterol (BREO ELLIPTA) 200-25 MCG/INH AEPB Inhale 1 puff into the lungs daily.    . furosemide (LASIX) 40 MG tablet Take 1 tablet (40 mg total) by mouth 2 (two) times daily. 60 tablet 3  . levalbuterol (XOPENEX) 0.63 MG/3ML nebulizer solution Take 0.63 mg by nebulization every 4 (four) hours as needed for wheezing or shortness of breath.    . losartan (COZAAR) 25 MG tablet Take 1 tablet (25 mg total) by mouth daily. 30 tablet 0  . metoprolol  succinate (TOPROL-XL) 25 MG 24 hr tablet Take 0.5 tablets (12.5 mg total) by mouth daily. 30 tablet 0  . omeprazole (PRILOSEC) 20 MG capsule Take 20 mg by mouth daily.    . ondansetron (ZOFRAN ODT) 4 MG disintegrating tablet Take 1 tablet (4 mg total) by mouth every 8 (eight) hours as needed. 10 tablet 0  . potassium chloride 20 MEQ TBCR Take 10 mEq by mouth daily. 30 tablet 3  . Propylene Glycol (SYSTANE BALANCE) 0.6 % SOLN Place 1 drop into both eyes 2 (two) times daily as needed (DRY EYES).     . rifaximin (XIFAXAN) 550 MG TABS tablet Take 550 mg by mouth. Take for 10 days    . simvastatin (ZOCOR) 20 MG tablet Take 20 mg by mouth at bedtime.     [name] Kitchen spironolactone (ALDACTONE) 25 MG tablet Take 0.5 tablets (12.5 mg total) by mouth daily. 20 tablet 3  . sacubitril-valsartan (ENTRESTO) 24-26 MG Take 1 tablet by mouth 2 (two) times daily. 60 tablet 3   No current facility-administered medications for this encounter.    BP 128/74   Pulse 67   Ht 5' 9.5" (1.765 m)   Wt 281 lb 4 oz (127.6 kg)   SpO2 97%   BMI 40.94 kg/m  General: NAD Neck: JVP 8-9 cm, no thyromegaly or thyroid nodule.  Lungs: CTAB. CV: Nondisplaced PMI.  Heart regular S1/S2, no S3/S4, 3/6 HSM at apex.  No peripheral edema.  No carotid bruit.  Normal pedal pulses.  Abdomen: Soft, nontender, no hepatosplenomegaly, no distention.  Skin: Intact without lesions or rashes.  Neurologic: Alert and oriented x 3.  Psych: Normal affect. Extremities: No clubbing or cyanosis.  HEENT: Normal.   Assessment/Plan: 1. Chronic systolic CHF: EF 16-10% on echo in 4/18, nonischemic cardiomyopathy.  No prior echo.  She has severe mitral regurgitation and a rheumatic-appearing mitral valve.  It is hard to know which was the initial problem (cardiomyopathy or mitral regurgitation), and hard to know how related they are.  Cannot rule out valvular cardiomyopathy due to long-term severe mitral regurgitation (insidious onset of symptoms almost a year  ago, and had never had an echo).  Alternatively, she could have a nonischemic cardiomyopathy from some other etiology like myocarditis and have a co-existing rheumatic mitral valve with perhaps worsening of MR from annular dilatation (secondary MR). She has done much better since last appointment with medication adjustment.  Weight is down though she is still mildly volume overloaded.  Now NYHA class II.   - Continue current Toprol XL ans spironolactone.   - Will continue Lasix 40 mg bid.  - Stop losartan, start Entresto 24/26 bid.  BMET/BNP today, repeat BMET in 2 wks.   - Continue digoxin, check level today.  - Repeat RHC when meds and volume status optimized.   -  Next step will be addition of Bidil and titration of spironolactone.   2. Rheumatic heart disease: Severe MR with a rheumatic-appearing valve, also moderate TR.  There are 2 jets of MR from 2 defined areas of malcoaptation.  As above, do not know if there is a co-existing nonischemic cardiomyopathy or if long-standing severe MR led to a cardiomyopathy.  Mitral regurgitation is likely mixed => rheumatic mitral valve disease but also concern for a component of secondary MR from annular dilatation.  Not straightforward situation, would be high risk for repair/replacement of MV until she is better compensated. - Medical management as above.  - Would repeat echo versus TEE after optimal medical management.  If MR remains severe and she is medically stabilized, will need to discuss valve surgery with Dr. Cornelius Moras.  3. LBBB: If EF remains low, she will likely be a CRT-D candidate. 4. Nausea: Resolved. I think that this may have been due more to CHF than anything else.   Followup in 2 wks with pharmacist for med titration, see me in 1 month.  Margaret Vargas 12/24/2016

## 2017-01-02 ENCOUNTER — Encounter: Payer: Self-pay | Admitting: Thoracic Surgery (Cardiothoracic Vascular Surgery)

## 2017-01-02 ENCOUNTER — Ambulatory Visit (INDEPENDENT_AMBULATORY_CARE_PROVIDER_SITE_OTHER): Payer: BLUE CROSS/BLUE SHIELD | Admitting: Thoracic Surgery (Cardiothoracic Vascular Surgery)

## 2017-01-02 ENCOUNTER — Ambulatory Visit
Admission: RE | Admit: 2017-01-02 | Discharge: 2017-01-02 | Disposition: A | Discharge status: Home / Self Care | Payer: BLUE CROSS/BLUE SHIELD | Source: Ambulatory Visit | Attending: Thoracic Surgery (Cardiothoracic Vascular Surgery) | Admitting: Thoracic Surgery (Cardiothoracic Vascular Surgery)

## 2017-01-02 VITALS — BP 126/79 | HR 78 | Resp 21 | Ht 69.5 in | Wt 271.0 lb

## 2017-01-02 DIAGNOSIS — Z01818 Encounter for other preprocedural examination: Secondary | ICD-10-CM

## 2017-01-02 DIAGNOSIS — R911 Solitary pulmonary nodule: Secondary | ICD-10-CM

## 2017-01-02 DIAGNOSIS — I7409 Other arterial embolism and thrombosis of abdominal aorta: Secondary | ICD-10-CM

## 2017-01-02 DIAGNOSIS — I71019 Dissection of thoracic aorta, unspecified: Secondary | ICD-10-CM

## 2017-01-02 DIAGNOSIS — I7101 Dissection of thoracic aorta: Secondary | ICD-10-CM

## 2017-01-02 DIAGNOSIS — I340 Nonrheumatic mitral (valve) insufficiency: Secondary | ICD-10-CM | POA: Diagnosis not present

## 2017-01-02 DIAGNOSIS — I428 Other cardiomyopathies: Secondary | ICD-10-CM | POA: Diagnosis not present

## 2017-01-02 HISTORY — DX: Solitary pulmonary nodule: R91.1

## 2017-01-02 MED ORDER — IOPAMIDOL (ISOVUE-370) INJECTION 76%
75.0000 mL | Freq: Once | INTRAVENOUS | Status: AC | PRN
  Administered 2017-01-02: 75 mL via INTRAVENOUS

## 2017-01-02 NOTE — Patient Instructions (Signed)
Continue all previous medications without any changes at this time  Continue to follow up closely with Dr Shirlee Latch in the heart failure clinic

## 2017-01-02 NOTE — Progress Notes (Signed)
[address]       [name] [address]             [phone]     CARDIOTHORACIC SURGERY OFFICE NOTE  Referring Provider is Wendall Stade, MD  Primary Cardiologist is Pricilla Riffle, MD Advanced Heart Failure Cardiologist is Laurey Morale, MD PCP is Mila Palmer, MD   HPI:  Patient is a 66 year old morbidly obese African-American female with multiple medical problems who returns to the office today for follow-up of stage D severe symptomatic mitral regurgitation, acute on chronic combined systolic and diastolic congestive heart failure, and nonischemic cardiomyopathy. She was originally seen in consultation on 12/06/2016.  Prior to that she was hospitalized with congestive heart failure and underwent a full workup including both transthoracic and transesophageal echocardiograms and diagnostic cardiac catheterization. She was noted to have a history of rheumatic disease but workup revealing findings consistent with likely secondary mitral regurgitation.  At the time of her office appointment here she notably remained in decompensated heart failure.  Since then she has been seen in follow-up on 2 occasions by Dr. Shirlee Latch in the advanced heart failure clinic. Her medications have been adjusted considerably and treatment options discussed. She returns to our office for follow-up. During the interim period time she has also been evaluated by gastroenterologist and treated with antibiotics and probiotics to treat presumed bacterial overgrowth of her colonic flora with history of C. difficile colitis.  She returns to our office today and reports that she feels terrific. She notes that she feels much better than she has in several months. She denies any symptoms of shortness of breath and her fluid retention and lower extremity edema have completely resolved. Her abdominal discomfort has resolved and her bowel function is normalized. She still has problems with anxiety and has  had some stressors at home recently, but despite this she has been doing very well with respect to symptoms of shortness of breath.   Current Outpatient Prescriptions  Medication Sig Dispense Refill  . albuterol (PROVENTIL HFA;VENTOLIN HFA) 108 (90 Base) MCG/ACT inhaler Inhale 2 puffs into the lungs every 6 (six) hours as needed for wheezing or shortness of breath.    . allopurinol (ZYLOPRIM) 300 MG tablet Take 300 mg by mouth daily.    [name] Kitchen ALPRAZolam (XANAX) 0.25 MG tablet Take 0.125 mg by mouth at bedtime as needed for anxiety.    [name] Kitchen aspirin EC 81 MG tablet Take 81 mg by mouth every morning.     . cholecalciferol (VITAMIN D) 1000 UNITS tablet Take 1,000 Units by mouth daily.    . colchicine 0.6 MG tablet Take 0.6 mg by mouth daily as needed (FLAREUPS).    . Cyanocobalamin (VITAMIN B-12) 2500 MCG SUBL Place 2,500 mcg under the tongue daily.    . digoxin (LANOXIN) 0.125 MG tablet Take 1 tablet (0.125 mg total) by mouth daily. 30 tablet 3  . escitalopram (LEXAPRO) 20 MG tablet Take 20 mg by mouth every morning.     . fluticasone (FLONASE) 50 MCG/ACT nasal spray Place 1 spray into both nostrils daily as needed for allergies or rhinitis.    . fluticasone furoate-vilanterol (BREO ELLIPTA) 200-25 MCG/INH AEPB Inhale 1 puff into the lungs daily.    . furosemide (LASIX) 40 MG tablet Take 1 tablet (40 mg total) by mouth 2 (two) times daily. 60 tablet 3  . levalbuterol (XOPENEX) 0.63 MG/3ML nebulizer solution Take 0.63 mg by nebulization every 4 (four) hours  as needed for wheezing or shortness of breath.    . metoprolol succinate (TOPROL-XL) 25 MG 24 hr tablet Take 0.5 tablets (12.5 mg total) by mouth daily. 30 tablet 0  . omeprazole (PRILOSEC) 20 MG capsule Take 20 mg by mouth daily.    . ondansetron (ZOFRAN ODT) 4 MG disintegrating tablet Take 1 tablet (4 mg total) by mouth every 8 (eight) hours as needed. 10 tablet 0  . potassium chloride 20 MEQ TBCR Take 10 mEq by mouth daily. 30 tablet 3  . Propylene  Glycol (SYSTANE BALANCE) 0.6 % SOLN Place 1 drop into both eyes 2 (two) times daily as needed (DRY EYES).     . rifaximin (XIFAXAN) 550 MG TABS tablet Take 550 mg by mouth. Take for 10 days    . sacubitril-valsartan (ENTRESTO) 24-26 MG Take 1 tablet by mouth 2 (two) times daily. 60 tablet 3  . simvastatin (ZOCOR) 20 MG tablet Take 20 mg by mouth at bedtime.     [name] Kitchen spironolactone (ALDACTONE) 25 MG tablet Take 0.5 tablets (12.5 mg total) by mouth daily. 20 tablet 3   No current facility-administered medications for this visit.       Physical Exam:   BP 126/79   Pulse 78   Resp (!) 21   Ht 5' 9.5" (1.765 m)   Wt 271 lb (122.9 kg)   SpO2 99% Comment: on RA  BMI 39.45 kg/m   General:  Obese but well appearing  Chest:   Clear to auscultation  CV:   Regular rate and rhythm with no clear murmur on exam  Incisions:  n/a  Abdomen:  Soft and nontender  Extremities:  Warm and well-perfused, no lower extremity edema  Diagnostic Tests:  CT ANGIOGRAPHY CHEST, ABDOMEN AND PELVIS  TECHNIQUE: Multidetector CT imaging through the chest, abdomen and pelvis was performed using the standard protocol during bolus administration of intravenous contrast. Multiplanar reconstructed images and MIPs were obtained and reviewed to evaluate the vascular anatomy.  CONTRAST:  75 cc Isovue 370 IV  COMPARISON:  CT abdomen and pelvis 11/12/2016  FINDINGS: CTA CHEST FINDINGS  Cardiovascular: No evidence of aortic aneurysm or dissection. Moderate calcified and noncalcified plaque in the descending thoracic aorta. Heart is mildly enlarged.  Mediastinum/Nodes: No mediastinal, hilar, or axillary adenopathy. Trachea and esophagus are unremarkable.  Lungs/Pleura: Well-circumscribed rounded nodule in the right upper lobe measures 17 mm. Central calcification noted. This is nonspecific, but given the calcification could reflect partially calcified granuloma or hamartoma. No confluent airspace  opacities. No effusions.  Musculoskeletal: No acute bony abnormality.  Review of the MIP images confirms the above findings.  CTA ABDOMEN AND PELVIS FINDINGS  VASCULAR  Aorta: Widely patent. Scattered aortic calcifications in the infrarenal aorta. No dissection or stenosis. No occlusive disease.  Celiac: Widely patent  SMA: Widely patent  Renals: Single bilaterally, widely patent  IMA: Widely patent  Inflow: Widely patent. No aneurysm or dissection. No occlusive disease.  Veins: Grossly unremarkable.  Review of the MIP images confirms the above findings.  NON-VASCULAR  Hepatobiliary: Prior cholecystectomy.  No focal hepatic abnormality.  Pancreas: No focal abnormality or ductal dilatation.  Spleen: No focal abnormality.  Normal size.  Adrenals/Urinary Tract: No adrenal abnormality. No focal renal abnormality. No stones or hydronephrosis. Urinary bladder is unremarkable.  Stomach/Bowel: Normal appendix. Stomach, large and small bowel grossly unremarkable.  Lymphatic: No adenopathy.  Reproductive: Uterus and adnexa unremarkable.  No mass.  Other: No free fluid or free air.  Musculoskeletal: No acute  bony abnormality. Postoperative changes from posterior fusion at L4-5.  Review of the MIP images confirms the above findings.  IMPRESSION: No evidence of aortic aneurysm, dissection or aortic occlusive disease. There is moderate calcified and noncalcified plaque in the descending thoracic aorta. Scattered calcifications in the infrarenal aorta.  17 mm well-circumscribed round nodule in the right upper lobe. This has a single central calcification and could reflect partially calcified granuloma or hamartoma. This could be further evaluated with PET CT or followed with repeat CT in 3-6 months.  No acute findings in the chest, abdomen or pelvis.   Electronically Signed   By: Charlett Nose M.D.   On: 01/02/2017  09:54   Impression:  Patient appears to be doing remarkably well with essentially complete resolution of all of her previous symptoms of shortness of breath, orthopnea, and lower extremity edema. Her abdominal discomfort and nausea have also improved.  CT angiogram performed earlier today reveals no contraindications to peripheral arterial cannulation for surgery. The patient was found to have a small well-circumscribed nodule in the right upper lobe consistent with likely granuloma or hamartoma. This will need follow-up imaging.   Plan:  I agree with plans outlined previously by Dr. Shirlee Latch. As long as the patient continues to do well clinically, she will need to undergo follow-up transthoracic and possibly transesophageal echocardiogram in the near future. If the patient still has severe mitral regurgitation despite optimal medical therapy and findings are consistent with likely rheumatic disease, we may need to consider proceeding with elective mitral valve repair or replacement in the near future. On the other hand, if follow-up TEE demonstrates noticeable improvement in the patient's mitral regurgitation, continued medical therapy for presumed secondary mitral regurgitation would be reasonable. Finally, the patient will need to undergo follow-up CT scan in 3-6 months. She will return to our office in 3 months. She will call and return sooner should specific problems or questions arise.  I spent in excess of 15 minutes during the conduct of this office consultation and >50% of this time involved direct face-to-face encounter with the patient for counseling and/or coordination of their care.    Salvatore Decent. Cornelius Moras, MD 01/02/2017 3:06 PM

## 2017-01-03 ENCOUNTER — Encounter (HOSPITAL_COMMUNITY): Payer: BLUE CROSS/BLUE SHIELD

## 2017-01-11 ENCOUNTER — Ambulatory Visit (HOSPITAL_COMMUNITY)
Admission: RE | Admit: 2017-01-11 | Discharge: 2017-01-11 | Disposition: A | Discharge status: Home / Self Care | Payer: BLUE CROSS/BLUE SHIELD | Source: Ambulatory Visit | Attending: Internal Medicine | Admitting: Internal Medicine

## 2017-01-11 VITALS — BP 112/64 | HR 77 | Wt 271.2 lb

## 2017-01-11 DIAGNOSIS — I5022 Chronic systolic (congestive) heart failure: Secondary | ICD-10-CM

## 2017-01-11 DIAGNOSIS — I447 Left bundle-branch block, unspecified: Secondary | ICD-10-CM | POA: Diagnosis not present

## 2017-01-11 DIAGNOSIS — Z79899 Other long term (current) drug therapy: Secondary | ICD-10-CM | POA: Insufficient documentation

## 2017-01-11 DIAGNOSIS — I058 Other rheumatic mitral valve diseases: Secondary | ICD-10-CM | POA: Diagnosis not present

## 2017-01-11 LAB — BASIC METABOLIC PANEL
Anion gap: 9 (ref 5–15)
BUN: 13 mg/dL (ref 6–20)
CHLORIDE: 105 mmol/L (ref 101–111)
CO2: 24 mmol/L (ref 22–32)
Calcium: 9.6 mg/dL (ref 8.9–10.3)
Creatinine, Ser: 0.87 mg/dL (ref 0.44–1.00)
GFR calc non Af Amer: >60 mL/min (ref >60)
Glucose, Bld: 113 mg/dL — ABNORMAL HIGH (ref 65–99)
Potassium: 4.2 mmol/L (ref 3.5–5.1)
SODIUM: 138 mmol/L (ref 135–145)

## 2017-01-11 LAB — BRAIN NATRIURETIC PEPTIDE: B NATRIURETIC PEPTIDE 5: 205.8 pg/mL — AB (ref 0.0–100.0)

## 2017-01-11 MED ORDER — SPIRONOLACTONE 25 MG PO TABS: 25.0000 mg | ORAL_TABLET | Freq: Every day | ORAL | 3 refills | Status: DC

## 2017-01-11 MED ORDER — METOPROLOL SUCCINATE ER 25 MG PO TB24: 12.5000 mg | ORAL_TABLET | Freq: Every day | ORAL | 3 refills | Status: DC

## 2017-01-11 NOTE — Progress Notes (Signed)
HF MD: [facility]  HPI:  66 yo AA female with history of suspected rheumatic fever, nonischemic cardiomyopathy, and severe mitral regurgitation presents for CHF followup.  Patient had an episode in childhood that was thought to be rheumatic fever.  She had a murmur as a child, but never had an echocardiogram.  Starting last summer, she developed exertional dyspnea. This has been progressive since that time.  She never saw a cardiologist until she was admitted in 4/18 with worsening dyspnea and weight gain.  Weight increased about 35 lbs from last summer.    She was diuresed in the hospital and had an echo and a TEE, showing EF 25-30% with rheumatic-appearing mitral valve and severe mitral regurgitation.  She has been seen by Dr Barry Dienes for evaluation for surgical repair/replacement of the MV.   Presents today for pharmacist-led HF medication titration. At last HF clinic visit on 12/23/16, her losartan was discontinued and she was started on Entresto 24-26 mg PO BID. She has done well since then but did state that she feels dizzy at times upon standing too quickly. No longer having GI issues, was taken off of Xifaxan and is now on Align.  No dyspnea walking on flat ground and no longer having orthopnea. Still very anxious and depressed at times but plans to start walking with a group of friends tomorrow which she feels will help with this.    . Shortness of breath/dyspnea on exertion? no  . Orthopnea/PND? no . Edema? no . Lightheadedness/dizziness? Yes - only upon standing too quickly . Daily weights at home? Yes - stable 268-270 lb . Blood pressure/heart rate monitoring at home? Yes - SBP 120s . Following low-sodium/fluid-restricted diet? Yes - bland food  HF Medications: Digoxin 0.125 mg PO daily Furosemide 40 mg PO BID Metoprolol succinate 12.5 mg PO daily KCl 10 mEq PO daily Entresto 24-26 mg PO BID Spironolactone 12.5 mg PO daily  Has the patient been experiencing any side effects to the  medications prescribed?  no  Does the patient have any problems obtaining medications due to transportation or finances?   No - currently has [employer] scientist but will switch to Medicare in August  Understanding of regimen: good Understanding of indications: good Potential of compliance: good Patient understands to avoid NSAIDs. Patient understands to avoid decongestants.    Pertinent Lab Values: . 01/11/17: Serum creatinine 0.87, BUN 13, Potassium 4.2, Sodium 138, BNP 205 . 12/23/16: Digoxin 0.9  . 11/18/16: Mag 1.9  Vital Signs: . Weight: 271 lb (last HF clinic visit weight: 281 lb) . Blood pressure: 112/64 mmHg  . Heart rate: 77 bpm    Assessment: 1. Chronicsystolic CHF (EF 79-02% on ECHO 11/22/16), due to NICM (unsure of cause, possible valvular CM). NYHA class IIsymptoms.  - Volume status much improved today, down 10 lbs since last clinic visit and stable at this weight at home  - Increase spironolactone 25 mg daily and discontinue KCl 10 meq daily   - Continue digoxin 0.125 mg daily, furosemide 40 mg BID, metoprolol succinate 12.5 mg daily and Entresto 24-26 mg BID  - Discussed addition of Bidil in the future but with recent dizziness, will make one change at a time - Basic disease state pathophysiology, medication indication, mechanism and side effects reviewed at length with patient and he verbalized understanding 2. Rheumatic heart disease  - Medical management as above   - Plans for repeat ECHO vs TEE after optimal medical management  - No plans for surgery  yet per visit with Dr. Cornelius Moras on 01/02/17 3. LBBB  - If EF remains low, may be CRT-D candidate  Plan: 1) Medication changes: Based on clinical presentation, vital signs and recent labs will increase spironolactone to 25 mg daily and d/c KCl 10 meq daily 2) Labs: BMET/BNP today  3) Follow-up: Dr. Shirlee Latch on 02/13/17   Tyler Deis. Bonnye Fava, PharmD, BCPS, CPP Clinical Pharmacist Pager:  [phone] Phone: [phone] 01/11/2017 2:26 PM

## 2017-01-11 NOTE — Patient Instructions (Addendum)
It was great to see you today!  Please INCREASE your spironolactone to 25 mg (1 tablet) DAILY.  Please STOP your Klor-con 10 mEq daily.   Labs today. We will call you with any abnormalities.   Please keep your appointment with Dr. Shirlee Latch on 02/13/17.

## 2017-01-12 ENCOUNTER — Telehealth (HOSPITAL_COMMUNITY): Payer: Self-pay | Admitting: Pharmacist

## 2017-01-12 NOTE — Telephone Encounter (Signed)
Ms. Luiz Blare called stating she fell on her knee and was wondering what OTC pain medication she could take. I recommended taking APAP for her pain and avoiding NSAIDs like ibuprofen and Aleve. She verbalized understanding and was grateful for the advice.   Tyler Deis. Bonnye Fava, PharmD, BCPS, CPP Clinical Pharmacist Pager: [phone] Phone: [phone] 01/12/2017 2:53 PM

## 2017-02-13 ENCOUNTER — Ambulatory Visit (HOSPITAL_COMMUNITY)
Admission: RE | Admit: 2017-02-13 | Discharge: 2017-02-13 | Disposition: A | Discharge status: Home / Self Care | Payer: BLUE CROSS/BLUE SHIELD | Source: Ambulatory Visit | Attending: Cardiology | Admitting: Cardiology

## 2017-02-13 VITALS — BP 130/73 | HR 84 | Wt 264.5 lb

## 2017-02-13 DIAGNOSIS — Z79899 Other long term (current) drug therapy: Secondary | ICD-10-CM | POA: Diagnosis not present

## 2017-02-13 DIAGNOSIS — I340 Nonrheumatic mitral (valve) insufficiency: Secondary | ICD-10-CM | POA: Diagnosis not present

## 2017-02-13 DIAGNOSIS — Z8249 Family history of ischemic heart disease and other diseases of the circulatory system: Secondary | ICD-10-CM | POA: Insufficient documentation

## 2017-02-13 DIAGNOSIS — G4733 Obstructive sleep apnea (adult) (pediatric): Secondary | ICD-10-CM | POA: Diagnosis not present

## 2017-02-13 DIAGNOSIS — F419 Anxiety disorder, unspecified: Secondary | ICD-10-CM | POA: Diagnosis not present

## 2017-02-13 DIAGNOSIS — F329 Major depressive disorder, single episode, unspecified: Secondary | ICD-10-CM | POA: Insufficient documentation

## 2017-02-13 DIAGNOSIS — I447 Left bundle-branch block, unspecified: Secondary | ICD-10-CM | POA: Diagnosis not present

## 2017-02-13 DIAGNOSIS — Z7982 Long term (current) use of aspirin: Secondary | ICD-10-CM | POA: Insufficient documentation

## 2017-02-13 DIAGNOSIS — I429 Cardiomyopathy, unspecified: Secondary | ICD-10-CM | POA: Insufficient documentation

## 2017-02-13 DIAGNOSIS — I5022 Chronic systolic (congestive) heart failure: Secondary | ICD-10-CM | POA: Diagnosis present

## 2017-02-13 DIAGNOSIS — R634 Abnormal weight loss: Secondary | ICD-10-CM | POA: Diagnosis not present

## 2017-02-13 DIAGNOSIS — I099 Rheumatic heart disease, unspecified: Secondary | ICD-10-CM | POA: Diagnosis not present

## 2017-02-13 DIAGNOSIS — R911 Solitary pulmonary nodule: Secondary | ICD-10-CM | POA: Diagnosis not present

## 2017-02-13 DIAGNOSIS — I051 Rheumatic mitral insufficiency: Secondary | ICD-10-CM

## 2017-02-13 LAB — DIGOXIN LEVEL: Digoxin Level: 0.4 ng/mL — ABNORMAL LOW (ref 0.8–2.0)

## 2017-02-13 LAB — BASIC METABOLIC PANEL
ANION GAP: 9 (ref 5–15)
BUN: 14 mg/dL (ref 6–20)
CALCIUM: 9.8 mg/dL (ref 8.9–10.3)
CO2: 26 mmol/L (ref 22–32)
Chloride: 105 mmol/L (ref 101–111)
Creatinine, Ser: 0.9 mg/dL (ref 0.44–1.00)
GFR calc non Af Amer: >60 mL/min (ref >60)
GLUCOSE: 130 mg/dL — AB (ref 65–99)
POTASSIUM: 3.8 mmol/L (ref 3.5–5.1)
Sodium: 140 mmol/L (ref 135–145)

## 2017-02-13 MED ORDER — METOPROLOL SUCCINATE ER 25 MG PO TB24: 25.0000 mg | ORAL_TABLET | Freq: Every day | ORAL | 3 refills | Status: DC

## 2017-02-13 MED ORDER — FUROSEMIDE 40 MG PO TABS: 40.0000 mg | ORAL_TABLET | Freq: Every day | ORAL | 3 refills | Status: DC

## 2017-02-13 NOTE — Patient Instructions (Signed)
Increase Metoprolol to 25 mg (1 tab) daily at bedtime  Decrease Furosemide (Lasix) to 40 mg daily  Labs today  Your physician recommends that you schedule a follow-up appointment in: 4 weeks

## 2017-02-13 NOTE — Progress Notes (Signed)
PCP: Dr. Paulino Rily HF Cardiology: Dr. Shirlee Latch  66 yo with history of suspected rheumatic fever, nonischemic cardiomyopathy, and severe mitral regurgitation presents for CHF followup.  Patient had an episode in childhood that was thought to be rheumatic fever.  She had a murmur as a child, but never had an echocardiogram.  Starting last summer, she developed exertional dyspnea. This has been progressive since that time.  She never saw a cardiologist until she was admitted in 4/18 with worsening dyspnea and weight gain.  Weight increased about 35 lbs from last summer.   She was diuresed in the hospital and had an echo and a TEE, showing EF 25-30% with rheumatic-appearing mitral valve and severe mitral regurgitation.  She has been seen by Dr Barry Dienes for evaluation for surgical repair/replacement of the MV.  Given improvement in symptoms, he suggested repeating TEE eventually to reassess.   She continues to do well with her current medication regimen.  Main complaint is lightheadedness if she stands too fast after sitting or lying down for a period of time.  Her weight is down 17 lbs.  Her BP is not low.  No dyspnea walking on flat ground.  Mild dyspnea walking up a hill or up stairs.  Mild dyspnea with heavy lifting.     ECG: NSR, LBBB  Labs (4/18): BNP 698, K 4.3, creatinine 1.13 => 0.94, BNP 599 Labs (5/18): BNP 206, K 4.2, creatinine 0.87  PMH: 1. OSA 2. LBBB 3. h/o C difficile 4. Rheumatic fever as a child with possible rheumatic heart disease and severe MR.  - Echo (4/18): EF 25-30%, mild RV dilation with mildly decreased RV systolic function, moderate TR, thickened MV leaflets with 2 MR jets (2 defined areas of malcoaptation), severe MR.   - TEE (4/18): EF 25-30%, mild LV dilation, confirms severe MR with the above features.  5. Chronic systolic CHF: Nonischemic, prior myocarditis + valvular, versus all valvular.  - RHC/LHC (4/18): No significant CAD, severe MR.  Mean RA 18, PA 61/29, mean PCWP  30, CI 1.89.  6. Anxiety/depression 7. Lung nodule by CT   SH: Married, lives in [place], retired [employer] pilot, daughter lives in [place], nonsmoker, no ETOH.   FH: Mother with sudden cardiac death at 61.   ROS: All systems reviewed and negative except as per HPI.   Current Outpatient Prescriptions  Medication Sig Dispense Refill  . albuterol (PROVENTIL HFA;VENTOLIN HFA) 108 (90 Base) MCG/ACT inhaler Inhale 2 puffs into the lungs every 6 (six) hours as needed for wheezing or shortness of breath.    . allopurinol (ZYLOPRIM) 300 MG tablet Take 300 mg by mouth daily.    [name] Kitchen ALPRAZolam (XANAX) 0.25 MG tablet Take 0.125 mg by mouth at bedtime as needed for anxiety.    [name] Kitchen aspirin EC 81 MG tablet Take 81 mg by mouth every morning.     . cholecalciferol (VITAMIN D) 1000 UNITS tablet Take 1,000 Units by mouth daily.    . colchicine 0.6 MG tablet Take 0.6 mg by mouth daily as needed (FLAREUPS).    . Cyanocobalamin (VITAMIN B-12) 2500 MCG SUBL Place 2,500 mcg under the tongue daily.    . digoxin (LANOXIN) 0.125 MG tablet Take 1 tablet (0.125 mg total) by mouth daily. 30 tablet 3  . escitalopram (LEXAPRO) 20 MG tablet Take 20 mg by mouth every morning.     . fluticasone (FLONASE) 50 MCG/ACT nasal spray Place 1 spray into both nostrils daily as needed for allergies or rhinitis.    [name] Kitchen  fluticasone furoate-vilanterol (BREO ELLIPTA) 200-25 MCG/INH AEPB Inhale 1 puff into the lungs daily.    . furosemide (LASIX) 40 MG tablet Take 1 tablet (40 mg total) by mouth daily. 60 tablet 3  . levalbuterol (XOPENEX) 0.63 MG/3ML nebulizer solution Take 0.63 mg by nebulization every 4 (four) hours as needed for wheezing or shortness of breath.    . metoprolol succinate (TOPROL-XL) 25 MG 24 hr tablet Take 1 tablet (25 mg total) by mouth at bedtime. 90 tablet 3  . omeprazole (PRILOSEC) 20 MG capsule Take 20 mg by mouth daily.    . ondansetron (ZOFRAN ODT) 4 MG disintegrating tablet Take 1 tablet (4 mg total) by mouth every 8  (eight) hours as needed. 10 tablet 0  . Probiotic Product (ALIGN) 4 MG CAPS Take 4 mg by mouth daily.    [name] Kitchen Propylene Glycol (SYSTANE BALANCE) 0.6 % SOLN Place 1 drop into both eyes 2 (two) times daily as needed (DRY EYES).     . sacubitril-valsartan (ENTRESTO) 24-26 MG Take 1 tablet by mouth 2 (two) times daily. 60 tablet 3  . simvastatin (ZOCOR) 20 MG tablet Take 20 mg by mouth at bedtime.     [name] Kitchen spironolactone (ALDACTONE) 25 MG tablet Take 1 tablet (25 mg total) by mouth daily. 90 tablet 3   No current facility-administered medications for this encounter.    BP 130/73   Pulse 84   Wt 264 lb 8 oz (120 kg)   SpO2 94%   BMI 38.50 kg/m  General: NAD Neck: JVP not elevated, no thyromegaly or thyroid nodule.  Lungs: CTAB. CV: Nondisplaced PMI.  Heart regular S1/S2, no S3/S4, 2/6 HSM at apex.  No peripheral edema.  No carotid bruit.  Normal pedal pulses.  Abdomen: Soft, nontender, no hepatosplenomegaly, no distention.  Skin: Intact without lesions or rashes.  Neurologic: Alert and oriented x 3.  Psych: Normal affect. Extremities: No clubbing or cyanosis.  HEENT: Normal.   Assessment/Plan: 1. Chronic systolic CHF: EF 16-10% on echo in 4/18, nonischemic cardiomyopathy.  No prior echo.  She has severe mitral regurgitation and a rheumatic-appearing mitral valve.  It is hard to know which was the initial problem (cardiomyopathy or mitral regurgitation), and hard to know how related they are.  Cannot rule out valvular cardiomyopathy due to long-term severe mitral regurgitation (insidious onset of symptoms almost a year ago, and had never had an echo).  Alternatively, she could have a nonischemic cardiomyopathy from some other etiology like myocarditis and have a co-existing rheumatic mitral valve with perhaps worsening of MR from annular dilatation (secondary MR). She continues to improve, weight down again.  Not volume overloaded on exam.  Now NYHA class II.   - Increase Toprol XL to 25 mg daily,  take in pm.    - Significant weight loss, not volume overloaded on exam, has orthostatic symptoms.  I will decrease Lasix to 40 mg daily.  She will check her weight daily, can go back to 40 mg bid if weight rises.   - Continue Entresto and spironolactone at current doses.   BMET today.  - Continue digoxin, check level today.  - Can add Bidil in the future if orthostatic symptoms resolve.  - Plan to repeat RHC with TEE in 8/18.  - Will reassess LV EF with TEE in 8/18.  If EF remains low, she would be candidate for CRT-D.  2. Rheumatic heart disease: Severe MR with a rheumatic-appearing valve, also moderate TR.  There are 2 jets of  MR from 2 defined areas of malcoaptation.  As above, do not know if there is a co-existing nonischemic cardiomyopathy or if long-standing severe MR led to a cardiomyopathy.  Mitral regurgitation is likely mixed => rheumatic mitral valve disease but also concern for a component of secondary MR from annular dilatation.  Not straightforward situation, would be high risk for repair/replacement of MV until she is better compensated.  She is doing better symptomatically and murmur is not as loud.  - Repeat TEE in 8/18 to reassess, if MR is significantly improved, may be a large functional component and may be able to hold off on surgery.   3. LBBB: If EF remains low, she will be a CRT-D candidate. 4. Lung nodule: Repeat CT ordered through Dr Orvan July office.    Followup 3-4 weeks, will schedule TEE/RHC at that time.   Marca Ancona 02/13/2017

## 2017-02-16 ENCOUNTER — Telehealth (HOSPITAL_COMMUNITY): Payer: Self-pay | Admitting: *Deleted

## 2017-02-16 NOTE — Telephone Encounter (Signed)
Pt states since decreasing Lasix to 40 mg daily on Monday she has gained 5 lbs and is starting to feel "puffy"  Per Dr Alford Highland note:   I will decrease Lasix to 40 mg daily.  She will check her weight daily, can go back to 40 mg bid if weight rises.    Advised pt to take her 40 mg every AM and can take 40 mg in PM as needed, pt aware and agreeable

## 2017-02-24 ENCOUNTER — Other Ambulatory Visit: Payer: Self-pay | Admitting: *Deleted

## 2017-02-24 DIAGNOSIS — R911 Solitary pulmonary nodule: Secondary | ICD-10-CM

## 2017-03-15 ENCOUNTER — Encounter (HOSPITAL_COMMUNITY): Payer: Self-pay

## 2017-03-15 ENCOUNTER — Encounter (HOSPITAL_COMMUNITY): Payer: Self-pay | Admitting: Cardiology

## 2017-03-15 ENCOUNTER — Ambulatory Visit (HOSPITAL_COMMUNITY)
Admission: RE | Admit: 2017-03-15 | Discharge: 2017-03-15 | Disposition: A | Discharge status: Home / Self Care | Payer: Medicare Other | Source: Ambulatory Visit | Attending: Cardiology | Admitting: Cardiology

## 2017-03-15 VITALS — BP 130/78 | HR 70 | Wt 262.0 lb

## 2017-03-15 DIAGNOSIS — G4733 Obstructive sleep apnea (adult) (pediatric): Secondary | ICD-10-CM | POA: Diagnosis not present

## 2017-03-15 DIAGNOSIS — F419 Anxiety disorder, unspecified: Secondary | ICD-10-CM | POA: Diagnosis not present

## 2017-03-15 DIAGNOSIS — F329 Major depressive disorder, single episode, unspecified: Secondary | ICD-10-CM | POA: Insufficient documentation

## 2017-03-15 DIAGNOSIS — I051 Rheumatic mitral insufficiency: Secondary | ICD-10-CM

## 2017-03-15 DIAGNOSIS — R911 Solitary pulmonary nodule: Secondary | ICD-10-CM | POA: Diagnosis not present

## 2017-03-15 DIAGNOSIS — I099 Rheumatic heart disease, unspecified: Secondary | ICD-10-CM | POA: Diagnosis not present

## 2017-03-15 DIAGNOSIS — I5022 Chronic systolic (congestive) heart failure: Secondary | ICD-10-CM

## 2017-03-15 DIAGNOSIS — Z7982 Long term (current) use of aspirin: Secondary | ICD-10-CM | POA: Insufficient documentation

## 2017-03-15 DIAGNOSIS — I447 Left bundle-branch block, unspecified: Secondary | ICD-10-CM | POA: Insufficient documentation

## 2017-03-15 DIAGNOSIS — I429 Cardiomyopathy, unspecified: Secondary | ICD-10-CM | POA: Insufficient documentation

## 2017-03-15 DIAGNOSIS — R002 Palpitations: Secondary | ICD-10-CM | POA: Diagnosis not present

## 2017-03-15 DIAGNOSIS — I340 Nonrheumatic mitral (valve) insufficiency: Secondary | ICD-10-CM | POA: Insufficient documentation

## 2017-03-15 DIAGNOSIS — Z79899 Other long term (current) drug therapy: Secondary | ICD-10-CM | POA: Insufficient documentation

## 2017-03-15 MED ORDER — METOPROLOL SUCCINATE ER 25 MG PO TB24: ORAL_TABLET | ORAL | 6 refills | Status: DC

## 2017-03-15 NOTE — Patient Instructions (Signed)
INCREASE Toprol XL to 25 mg (1 tablet) in am and 12.5 mg (1/2 tablet) in pm.  You have been scheduled for a heart catheterization on Monday August 13th. Please see instruction sheet for additional details.  You have been scheduled for a transesophageal echocardiogram on Wednesday August 15th. See instruction sheet for additional details.  Follow up 1 month with Dr. Shirlee Latch. Take all medication as prescribed the day of your appointment. Bring all medications with you to your appointment.  Do the following things EVERYDAY: 1) Weigh yourself in the morning before breakfast. Write it down and keep it in a log. 2) Take your medicines as prescribed 3) Eat low salt foods-Limit salt (sodium) to 2000 mg per day.  4) Stay as active as you can everyday 5) Limit all fluids for the day to less than 2 liters

## 2017-03-16 NOTE — Progress Notes (Signed)
PCP: Dr. Wolters HF Cardiology: Dr. McLean  66 yo with history of suspected rheumatic fever, nonischemic cardiomyopathy, and severe mitral regurgitation presents for CHF followup.  Patient had an episode in childhood that was thought to be rheumatic fever.  She had a murmur as a child, but never had an echocardiogram.  Starting last summer, she developed exertional dyspnea. This has been progressive since that time.  She never saw a cardiologist until she was admitted in 4/18 with worsening dyspnea and weight gain.  Weight increased about 35 lbs from last summer.   She was diuresed in the hospital and had an echo and a TEE, showing EF 25-30% with rheumatic-appearing mitral valve and severe mitral regurgitation.  She has been seen by Dr Owens for evaluation for surgical repair/replacement of the MV.  Given improvement in symptoms, he suggested repeating TEE eventually to reassess.   She is doing pretty well.  Went to a vegan camp in [place] recently.  Weight is down another 2 lbs.  She increased Lasix back to 40 mg bid with lower extremity edema. She still gets lightheaded with standing if she rises too fast.  One fall about a month ago, lost balance.  She is short of breath walking up hills/inclines.  She is able to walk 2.5 miles in an hour, not dyspneic on flat ground.  No chest pain. No orthopnea/PND.  She has been having episodes of palpitations, heart feels irregular.  She has had about 4 episodes, each lasting < 30 minutes.     ECG: NSR, LBBB  Labs (4/18): BNP 698, K 4.3, creatinine 1.13 => 0.94, BNP 599 Labs (5/18): BNP 206, K 4.2, creatinine 0.87 Labs (7/18): digoxin 0.4, K 3.7, creatinine 0.98  PMH: 1. OSA 2. LBBB 3. h/o C difficile 4. Rheumatic fever as a child with possible rheumatic heart disease and severe MR.  - Echo (4/18): EF 25-30%, mild RV dilation with mildly decreased RV systolic function, moderate TR, thickened MV leaflets with 2 MR jets (2 defined areas of  malcoaptation), severe MR.   - TEE (4/18): EF 25-30%, mild LV dilation, confirms severe MR with the above features.  5. Chronic systolic CHF: Nonischemic, prior myocarditis + valvular, versus all valvular.  - RHC/LHC (4/18): No significant CAD, severe MR.  Mean RA 18, PA 61/29, mean PCWP 30, CI 1.89.  6. Anxiety/depression 7. 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   FH: Mother with sudden cardiac death at 84.   ROS: All systems reviewed and negative except as per HPI.   Current Outpatient Prescriptions  Medication Sig Dispense Refill  . albuterol (PROVENTIL HFA;VENTOLIN HFA) 108 (90 Base) MCG/ACT inhaler Inhale 2 puffs into the lungs every 6 (six) hours as needed for wheezing or shortness of breath.    . allopurinol (ZYLOPRIM) 300 MG tablet Take 300 mg by mouth daily.    . ALPRAZolam (XANAX) 0.25 MG tablet Take 0.125 mg by mouth at bedtime as needed for anxiety.    . aspirin EC 81 MG tablet Take 81 mg by mouth every morning.     . cholecalciferol (VITAMIN D) 1000 UNITS tablet Take 1,000 Units by mouth daily.    . colchicine 0.6 MG tablet Take 0.6 mg by mouth daily as needed (FLAREUPS).    . Cyanocobalamin (VITAMIN B-12) 2500 MCG SUBL Place 2,500 mcg under the tongue daily.    . digoxin (LANOXIN) 0.125 MG tablet Take 1 tablet (  0.125 mg total) by mouth daily. 30 tablet 3  . escitalopram (LEXAPRO) 20 MG tablet Take 20 mg by mouth every morning.     . fluticasone (FLONASE) 50 MCG/ACT nasal spray Place 1 spray into both nostrils daily as needed for allergies or rhinitis.    . fluticasone furoate-vilanterol (BREO ELLIPTA) 200-25 MCG/INH AEPB Inhale 1 puff into the lungs daily.    . furosemide (LASIX) 40 MG tablet Take 1 tablet (40 mg total) by mouth daily. 60 tablet 3  . levalbuterol (XOPENEX) 0.63 MG/3ML nebulizer solution Take 0.63 mg by nebulization every 4 (four) hours as needed for wheezing or shortness of  breath.    . metoprolol succinate (TOPROL-XL) 25 MG 24 hr tablet Take 25 mg (1 tablet) in am and 12.5 mg (1/2 tablet) in pm 45 tablet 6  . omeprazole (PRILOSEC) 20 MG capsule Take 20 mg by mouth daily.    . ondansetron (ZOFRAN ODT) 4 MG disintegrating tablet Take 1 tablet (4 mg total) by mouth every 8 (eight) hours as needed. 10 tablet 0  . Probiotic Product (ALIGN) 4 MG CAPS Take 4 mg by mouth daily.    . Propylene Glycol (SYSTANE BALANCE) 0.6 % SOLN Place 1 drop into both eyes 2 (two) times daily as needed (DRY EYES).     . sacubitril-valsartan (ENTRESTO) 24-26 MG Take 1 tablet by mouth 2 (two) times daily. 60 tablet 3  . simvastatin (ZOCOR) 20 MG tablet Take 20 mg by mouth at bedtime.     . spironolactone (ALDACTONE) 25 MG tablet Take 1 tablet (25 mg total) by mouth daily. 90 tablet 3   No current facility-administered medications for this encounter.    BP 130/78 (BP Location: Left Arm, Patient Position: Sitting, Cuff Size: Normal)   Pulse 70   Wt 262 lb (118.8 kg)   SpO2 96%   BMI 38.14 kg/m  General: NAD Neck: No JVD, no thyromegaly or thyroid nodule.  Lungs: Clear to auscultation bilaterally with normal respiratory effort. CV: Nondisplaced PMI.  Heart regular S1/S2, no S3/S4, 2/6 HSM apex.  No peripheral edema.  No carotid bruit.  Normal pedal pulses.  Abdomen: Soft, nontender, no hepatosplenomegaly, no distention.  Skin: Intact without lesions or rashes.  Neurologic: Alert and oriented x 3.  Psych: Normal affect. Extremities: No clubbing or cyanosis.  HEENT: Normal.   Assessment/Plan: 1. Chronic systolic CHF: EF 25-30% on echo in 4/18, nonischemic cardiomyopathy.  No prior echo.  She has severe mitral regurgitation and a rheumatic-appearing mitral valve.  It is hard to know which was the initial problem (cardiomyopathy or mitral regurgitation), and hard to know how related they are.  Cannot rule out valvular cardiomyopathy due to long-term severe mitral regurgitation (insidious  onset of symptoms almost a year ago, and had never had an echo).  Alternatively, she could have a nonischemic cardiomyopathy from some other etiology like myocarditis and have a co-existing rheumatic mitral valve with perhaps worsening of MR from annular dilatation (secondary MR). She continues to improve, weight down again.  Not volume overloaded on exam.  Now NYHA class II.  Medication titration has been limited somewhat by orthostatic symptoms though her resting BP is 130/78 today.  - Increase Toprol XL to 25 qam/12.5 qpm.    - Continue Lasix 40 mg bid, did not tolerate decreased dose.   - Continue Entresto and spironolactone at current doses.   - Continue digoxin, recent level ok.  - Can add Bidil in the future if orthostatic symptoms resolve.  -   I will arrange for RHC along with TEE this month.  We discussed risks/benefits and she agrees to proceed.  - Will reassess LV EF with TEE in 8/18.  If EF remains low, she would be candidate for CRT-D.  2. Rheumatic heart disease: Severe MR with a rheumatic-appearing valve, also moderate TR.  There are 2 jets of MR from 2 defined areas of malcoaptation.  As above, do not know if there is a co-existing nonischemic cardiomyopathy or if long-standing severe MR led to a cardiomyopathy.  Mitral regurgitation is likely mixed => rheumatic mitral valve disease but also concern for a component of secondary MR from annular dilatation.  Not straightforward situation, would be high risk for repair/replacement of MV with low EF though now she seems better compensated.  Murmur is not as loud.  - I will set up a repeat TEE. If MR is significantly improved, may be a large functional component and may be able to hold off on surgery.   3. LBBB: If EF remains low, she will be a CRT-D candidate. 4. Lung nodule: Repeat CT ordered through Dr Owen's office.   5. Palpitations: She is predisposed to atrial fibrillation with low EF and mitral regurgitation.  She has been having runs  of irregular heart beats.  - I will arrange for 30 day monitor to assess for atrial fibrillation.   Followup in 3 wks.   Dalton McLean 03/16/2017    

## 2017-03-21 ENCOUNTER — Other Ambulatory Visit (HOSPITAL_COMMUNITY): Payer: Self-pay

## 2017-03-21 DIAGNOSIS — R0600 Dyspnea, unspecified: Secondary | ICD-10-CM

## 2017-03-27 ENCOUNTER — Encounter (HOSPITAL_COMMUNITY)
Admission: RE | Disposition: A | Discharge status: Home / Self Care | Payer: Self-pay | Source: Ambulatory Visit | Attending: Cardiology

## 2017-03-27 ENCOUNTER — Ambulatory Visit (HOSPITAL_COMMUNITY)
Admission: RE | Admit: 2017-03-27 | Discharge: 2017-03-27 | Disposition: A | Discharge status: Home / Self Care | Payer: Medicare Other | Source: Ambulatory Visit | Attending: Cardiology | Admitting: Cardiology

## 2017-03-27 DIAGNOSIS — G4733 Obstructive sleep apnea (adult) (pediatric): Secondary | ICD-10-CM | POA: Diagnosis not present

## 2017-03-27 DIAGNOSIS — F329 Major depressive disorder, single episode, unspecified: Secondary | ICD-10-CM | POA: Insufficient documentation

## 2017-03-27 DIAGNOSIS — I429 Cardiomyopathy, unspecified: Secondary | ICD-10-CM | POA: Diagnosis not present

## 2017-03-27 DIAGNOSIS — F419 Anxiety disorder, unspecified: Secondary | ICD-10-CM | POA: Insufficient documentation

## 2017-03-27 DIAGNOSIS — Z79899 Other long term (current) drug therapy: Secondary | ICD-10-CM | POA: Diagnosis not present

## 2017-03-27 DIAGNOSIS — Z7982 Long term (current) use of aspirin: Secondary | ICD-10-CM | POA: Diagnosis not present

## 2017-03-27 DIAGNOSIS — I447 Left bundle-branch block, unspecified: Secondary | ICD-10-CM | POA: Insufficient documentation

## 2017-03-27 DIAGNOSIS — I509 Heart failure, unspecified: Secondary | ICD-10-CM | POA: Diagnosis not present

## 2017-03-27 DIAGNOSIS — R0600 Dyspnea, unspecified: Secondary | ICD-10-CM

## 2017-03-27 DIAGNOSIS — I340 Nonrheumatic mitral (valve) insufficiency: Secondary | ICD-10-CM | POA: Diagnosis not present

## 2017-03-27 DIAGNOSIS — I099 Rheumatic heart disease, unspecified: Secondary | ICD-10-CM | POA: Insufficient documentation

## 2017-03-27 DIAGNOSIS — I5022 Chronic systolic (congestive) heart failure: Secondary | ICD-10-CM | POA: Insufficient documentation

## 2017-03-27 HISTORY — PX: RIGHT HEART CATH: CATH118263

## 2017-03-27 LAB — POCT I-STAT 3, VENOUS BLOOD GAS (G3P V)
Acid-Base Excess: 1 mmol/L (ref 0.0–2.0)
Acid-base deficit: 1 mmol/L (ref 0.0–2.0)
Bicarbonate: 24.3 mmol/L (ref 20.0–28.0)
Bicarbonate: 26.1 mmol/L (ref 20.0–28.0)
O2 Saturation: 51 %
O2 Saturation: 59 %
PCO2 VEN: 41.7 mmHg — AB (ref 44.0–60.0)
PH VEN: 7.392 (ref 7.250–7.430)
PO2 VEN: 31 mmHg — AB (ref 32.0–45.0)
TCO2: 26 mmol/L (ref 0–100)
TCO2: 27 mmol/L (ref 0–100)
pCO2, Ven: 40 mmHg — ABNORMAL LOW (ref 44.0–60.0)
pH, Ven: 7.404 (ref 7.250–7.430)
pO2, Ven: 28 mmHg — CL (ref 32.0–45.0)

## 2017-03-27 LAB — CBC
HCT: 38.8 % (ref 36.0–46.0)
Hemoglobin: 13 g/dL (ref 12.0–15.0)
MCH: 31.2 pg (ref 26.0–34.0)
MCHC: 33.5 g/dL (ref 30.0–36.0)
MCV: 93 fL (ref 78.0–100.0)
PLATELETS: 253 10*3/uL (ref 150–400)
RBC: 4.17 MIL/uL (ref 3.87–5.11)
RDW: 16.8 % — ABNORMAL HIGH (ref 11.5–15.5)
WBC: 5.2 10*3/uL (ref 4.0–10.5)

## 2017-03-27 LAB — BASIC METABOLIC PANEL
Anion gap: 7 (ref 5–15)
BUN: 15 mg/dL (ref 6–20)
CALCIUM: 9.3 mg/dL (ref 8.9–10.3)
CHLORIDE: 107 mmol/L (ref 101–111)
CO2: 25 mmol/L (ref 22–32)
CREATININE: 0.86 mg/dL (ref 0.44–1.00)
GFR calc Af Amer: >60 mL/min (ref >60)
GFR calc non Af Amer: >60 mL/min (ref >60)
Glucose, Bld: 107 mg/dL — ABNORMAL HIGH (ref 65–99)
Potassium: 3.9 mmol/L (ref 3.5–5.1)
Sodium: 139 mmol/L (ref 135–145)

## 2017-03-27 LAB — PROTIME-INR
INR: 0.97
PROTHROMBIN TIME: 12.9 s (ref 11.4–15.2)

## 2017-03-27 SURGERY — RIGHT HEART CATH
Anesthesia: LOCAL

## 2017-03-27 MED ORDER — ACETAMINOPHEN 325 MG PO TABS: 650.0000 mg | ORAL_TABLET | ORAL | Status: DC | PRN

## 2017-03-27 MED ORDER — SODIUM CHLORIDE 0.9% FLUSH: 3.0000 mL | INTRAVENOUS | Status: DC | PRN

## 2017-03-27 MED ORDER — HEPARIN (PORCINE) IN NACL 2-0.9 UNIT/ML-% IJ SOLN
INTRAMUSCULAR | Status: AC | PRN
Start: 2017-03-27 — End: 2017-03-27
  Administered 2017-03-27: 500 mL

## 2017-03-27 MED ORDER — LIDOCAINE HCL (PF) 1 % IJ SOLN
INTRAMUSCULAR | Status: DC | PRN
  Administered 2017-03-27: 2 mL via SUBCUTANEOUS

## 2017-03-27 MED ORDER — SODIUM CHLORIDE 0.9% FLUSH: 3.0000 mL | Freq: Two times a day (BID) | INTRAVENOUS | Status: DC

## 2017-03-27 MED ORDER — HEPARIN (PORCINE) IN NACL 2-0.9 UNIT/ML-% IJ SOLN
INTRAMUSCULAR | Status: AC
  Filled 2017-03-27: qty 500

## 2017-03-27 MED ORDER — MIDAZOLAM HCL 2 MG/2ML IJ SOLN
INTRAMUSCULAR | Status: DC | PRN
  Administered 2017-03-27 (×2): 0.5 mg via INTRAVENOUS

## 2017-03-27 MED ORDER — SODIUM CHLORIDE 0.9 % IV SOLN: 250.0000 mL | INTRAVENOUS | Status: DC | PRN

## 2017-03-27 MED ORDER — FENTANYL CITRATE (PF) 100 MCG/2ML IJ SOLN
INTRAMUSCULAR | Status: AC
  Filled 2017-03-27: qty 2

## 2017-03-27 MED ORDER — FENTANYL CITRATE (PF) 100 MCG/2ML IJ SOLN
INTRAMUSCULAR | Status: DC | PRN
  Administered 2017-03-27: 25 ug via INTRAVENOUS

## 2017-03-27 MED ORDER — SODIUM CHLORIDE 0.9 % IV SOLN
INTRAVENOUS | Status: DC
  Administered 2017-03-27: 08:00:00 via INTRAVENOUS

## 2017-03-27 MED ORDER — ASPIRIN 81 MG PO CHEW
81.0000 mg | CHEWABLE_TABLET | ORAL | Status: AC
  Administered 2017-03-27: 81 mg via ORAL

## 2017-03-27 MED ORDER — MIDAZOLAM HCL 2 MG/2ML IJ SOLN
INTRAMUSCULAR | Status: AC
  Filled 2017-03-27: qty 2

## 2017-03-27 MED ORDER — ONDANSETRON HCL 4 MG/2ML IJ SOLN: 4.0000 mg | Freq: Four times a day (QID) | INTRAMUSCULAR | Status: DC | PRN

## 2017-03-27 MED ORDER — ASPIRIN 81 MG PO CHEW
CHEWABLE_TABLET | ORAL | Status: AC
  Administered 2017-03-27: 81 mg via ORAL
  Filled 2017-03-27: qty 1

## 2017-03-27 MED ORDER — LIDOCAINE HCL (PF) 1 % IJ SOLN
INTRAMUSCULAR | Status: AC
  Filled 2017-03-27: qty 30

## 2017-03-27 SURGICAL SUPPLY — 7 items
CATH BALLN WEDGE 5F 110CM (CATHETERS) ×2 IMPLANT
GUIDEWIRE .025 260CM (WIRE) ×2 IMPLANT
KIT HEART LEFT (KITS) ×2 IMPLANT
PACK CARDIAC CATHETERIZATION (CUSTOM PROCEDURE TRAY) ×2 IMPLANT
SHEATH GLIDE SLENDER 4/5FR (SHEATH) ×2 IMPLANT
TRANSDUCER W/STOPCOCK (MISCELLANEOUS) ×2 IMPLANT
TUBING CIL FLEX 10 FLL-RA (TUBING) ×2 IMPLANT

## 2017-03-27 NOTE — Discharge Instructions (Signed)
Right Heart Catheterization, Care After °This sheet gives you information about how to care for yourself after your procedure. Your health care provider may also give you more specific instructions. If you have problems or questions, contact your health care provider. °What can I expect after the procedure? °After the procedure, it is common to have: °· Bruising or mild discomfort in the area where the IV was inserted (insertion site). ° °Follow these instructions at home: °Eating and drinking °· Follow instructions from your health care provider about eating or drinking restrictions. °· Drink a lot of fluids for the first several days after the procedure, as directed by your health care provider. This helps to wash (flush) the contrast out of your body. Examples of healthy fluids include water or low-calorie drinks. °General instructions °· Check your IV insertion area every day for signs of infection. Check for: °? Redness, swelling, or pain. °? Fluid or blood. °? Warmth. °? Pus or a bad smell. °· Take over-the-counter and prescription medicines only as told by your health care provider. °· Rest and return to your normal activities as told by your health care provider. Ask your health care provider what activities are safe for you. °· Do not drive for 24 hours if you were given a medicine to help you relax (sedative), or until your health care provider approves. °· Keep all follow-up visits as told by your health care provider. This is important. °Contact a health care provider if: °· Your skin becomes itchy or you develop a rash or hives. °· You have a fever that does not get better with medicine. °· You feel nauseous. °· You vomit. °· You have redness, swelling, or pain around the insertion site. °· You have fluid or blood coming from the insertion site. °· Your insertion area feels warm to the touch. °· You have pus or a bad smell coming from the insertion site. °Get help right away if: °· You have difficulty  breathing or shortness of breath. °· You develop chest pain. °· You faint. °· You feel very dizzy. °These symptoms may represent a serious problem that is an emergency. Do not wait to see if the symptoms will go away. Get medical help right away. Call your local emergency services (911 in the [place] Do not drive yourself to the hospital. °Summary °· After your procedure, it is common to have bruising or mild discomfort in the area where the IV was inserted. °· You should check your IV insertion area every day for signs of infection. °· Take over-the-counter and prescription medicines only as told by your health care provider. °· You should drink a lot of fluids for the first several days after the procedure to help flush the contrast from your body. °This information is not intended to replace advice given to you by your health care provider. Make sure you discuss any questions you have with your health care provider. °Document Released: 05/22/2013 Document Revised: 06/25/2016 Document Reviewed: 06/25/2016 °[employer] Interactive Patient Education © 2017 [employer] ° °

## 2017-03-27 NOTE — H&P (View-Only) (Signed)
PCP: Dr. Wolters HF Cardiology: Dr. Onyekachi Gathright  65 yo with history of suspected rheumatic fever, nonischemic cardiomyopathy, and severe mitral regurgitation presents for CHF followup.  Patient had an episode in childhood that was thought to be rheumatic fever.  She had a murmur as a child, but never had an echocardiogram.  Starting last summer, she developed exertional dyspnea. This has been progressive since that time.  She never saw a cardiologist until she was admitted in 4/18 with worsening dyspnea and weight gain.  Weight increased about 35 lbs from last summer.   She was diuresed in the hospital and had an echo and a TEE, showing EF 25-30% with rheumatic-appearing mitral valve and severe mitral regurgitation.  She has been seen by Dr Owens for evaluation for surgical repair/replacement of the MV.  Given improvement in symptoms, he suggested repeating TEE eventually to reassess.   She is doing pretty well.  Went to a vegan camp in [place] recently.  Weight is down another 2 lbs.  She increased Lasix back to 40 mg bid with lower extremity edema. She still gets lightheaded with standing if she rises too fast.  One fall about a month ago, lost balance.  She is short of breath walking up hills/inclines.  She is able to walk 2.5 miles in an hour, not dyspneic on flat ground.  No chest pain. No orthopnea/PND.  She has been having episodes of palpitations, heart feels irregular.  She has had about 4 episodes, each lasting < 30 minutes.     ECG: NSR, LBBB  Labs (4/18): BNP 698, K 4.3, creatinine 1.13 => 0.94, BNP 599 Labs (5/18): BNP 206, K 4.2, creatinine 0.87 Labs (7/18): digoxin 0.4, K 3.7, creatinine 0.98  PMH: 1. OSA 2. LBBB 3. h/o C difficile 4. Rheumatic fever as a child with possible rheumatic heart disease and severe MR.  - Echo (4/18): EF 25-30%, mild RV dilation with mildly decreased RV systolic function, moderate TR, thickened MV leaflets with 2 MR jets (2 defined areas of  malcoaptation), severe MR.   - TEE (4/18): EF 25-30%, mild LV dilation, confirms severe MR with the above features.  5. Chronic systolic CHF: Nonischemic, prior myocarditis + valvular, versus all valvular.  - RHC/LHC (4/18): No significant CAD, severe MR.  Mean RA 18, PA 61/29, mean PCWP 30, CI 1.89.  6. Anxiety/depression 7. Lung nodule by CT   SH: Married, lives in [place], retired accountant, daughter lives in [place], nonsmoker, no ETOH.   FH: Mother with sudden cardiac death at 84.   ROS: All systems reviewed and negative except as per HPI.   Current Outpatient Prescriptions  Medication Sig Dispense Refill  . albuterol (PROVENTIL HFA;VENTOLIN HFA) 108 (90 Base) MCG/ACT inhaler Inhale 2 puffs into the lungs every 6 (six) hours as needed for wheezing or shortness of breath.    . allopurinol (ZYLOPRIM) 300 MG tablet Take 300 mg by mouth daily.    . ALPRAZolam (XANAX) 0.25 MG tablet Take 0.125 mg by mouth at bedtime as needed for anxiety.    . aspirin EC 81 MG tablet Take 81 mg by mouth every morning.     . cholecalciferol (VITAMIN D) 1000 UNITS tablet Take 1,000 Units by mouth daily.    . colchicine 0.6 MG tablet Take 0.6 mg by mouth daily as needed (FLAREUPS).    . Cyanocobalamin (VITAMIN B-12) 2500 MCG SUBL Place 2,500 mcg under the tongue daily.    . digoxin (LANOXIN) 0.125 MG tablet Take 1 tablet (  0.125 mg total) by mouth daily. 30 tablet 3  . escitalopram (LEXAPRO) 20 MG tablet Take 20 mg by mouth every morning.     . fluticasone (FLONASE) 50 MCG/ACT nasal spray Place 1 spray into both nostrils daily as needed for allergies or rhinitis.    . fluticasone furoate-vilanterol (BREO ELLIPTA) 200-25 MCG/INH AEPB Inhale 1 puff into the lungs daily.    . furosemide (LASIX) 40 MG tablet Take 1 tablet (40 mg total) by mouth daily. 60 tablet 3  . levalbuterol (XOPENEX) 0.63 MG/3ML nebulizer solution Take 0.63 mg by nebulization every 4 (four) hours as needed for wheezing or shortness of  breath.    . metoprolol succinate (TOPROL-XL) 25 MG 24 hr tablet Take 25 mg (1 tablet) in am and 12.5 mg (1/2 tablet) in pm 45 tablet 6  . omeprazole (PRILOSEC) 20 MG capsule Take 20 mg by mouth daily.    . ondansetron (ZOFRAN ODT) 4 MG disintegrating tablet Take 1 tablet (4 mg total) by mouth every 8 (eight) hours as needed. 10 tablet 0  . Probiotic Product (ALIGN) 4 MG CAPS Take 4 mg by mouth daily.    . Propylene Glycol (SYSTANE BALANCE) 0.6 % SOLN Place 1 drop into both eyes 2 (two) times daily as needed (DRY EYES).     . sacubitril-valsartan (ENTRESTO) 24-26 MG Take 1 tablet by mouth 2 (two) times daily. 60 tablet 3  . simvastatin (ZOCOR) 20 MG tablet Take 20 mg by mouth at bedtime.     . spironolactone (ALDACTONE) 25 MG tablet Take 1 tablet (25 mg total) by mouth daily. 90 tablet 3   No current facility-administered medications for this encounter.    BP 130/78 (BP Location: Left Arm, Patient Position: Sitting, Cuff Size: Normal)   Pulse 70   Wt 262 lb (118.8 kg)   SpO2 96%   BMI 38.14 kg/m  General: NAD Neck: No JVD, no thyromegaly or thyroid nodule.  Lungs: Clear to auscultation bilaterally with normal respiratory effort. CV: Nondisplaced PMI.  Heart regular S1/S2, no S3/S4, 2/6 HSM apex.  No peripheral edema.  No carotid bruit.  Normal pedal pulses.  Abdomen: Soft, nontender, no hepatosplenomegaly, no distention.  Skin: Intact without lesions or rashes.  Neurologic: Alert and oriented x 3.  Psych: Normal affect. Extremities: No clubbing or cyanosis.  HEENT: Normal.   Assessment/Plan: 1. Chronic systolic CHF: EF 25-30% on echo in 4/18, nonischemic cardiomyopathy.  No prior echo.  She has severe mitral regurgitation and a rheumatic-appearing mitral valve.  It is hard to know which was the initial problem (cardiomyopathy or mitral regurgitation), and hard to know how related they are.  Cannot rule out valvular cardiomyopathy due to long-term severe mitral regurgitation (insidious  onset of symptoms almost a year ago, and had never had an echo).  Alternatively, she could have a nonischemic cardiomyopathy from some other etiology like myocarditis and have a co-existing rheumatic mitral valve with perhaps worsening of MR from annular dilatation (secondary MR). She continues to improve, weight down again.  Not volume overloaded on exam.  Now NYHA class II.  Medication titration has been limited somewhat by orthostatic symptoms though her resting BP is 130/78 today.  - Increase Toprol XL to 25 qam/12.5 qpm.    - Continue Lasix 40 mg bid, did not tolerate decreased dose.   - Continue Entresto and spironolactone at current doses.   - Continue digoxin, recent level ok.  - Can add Bidil in the future if orthostatic symptoms resolve.  -   I will arrange for RHC along with TEE this month.  We discussed risks/benefits and she agrees to proceed.  - Will reassess LV EF with TEE in 8/18.  If EF remains low, she would be candidate for CRT-D.  2. Rheumatic heart disease: Severe MR with a rheumatic-appearing valve, also moderate TR.  There are 2 jets of MR from 2 defined areas of malcoaptation.  As above, do not know if there is a co-existing nonischemic cardiomyopathy or if long-standing severe MR led to a cardiomyopathy.  Mitral regurgitation is likely mixed => rheumatic mitral valve disease but also concern for a component of secondary MR from annular dilatation.  Not straightforward situation, would be high risk for repair/replacement of MV with low EF though now she seems better compensated.  Murmur is not as loud.  - I will set up a repeat TEE. If MR is significantly improved, may be a large functional component and may be able to hold off on surgery.   3. LBBB: If EF remains low, she will be a CRT-D candidate. 4. Lung nodule: Repeat CT ordered through Dr Owen's office.   5. Palpitations: She is predisposed to atrial fibrillation with low EF and mitral regurgitation.  She has been having runs  of irregular heart beats.  - I will arrange for 30 day monitor to assess for atrial fibrillation.   Followup in 3 wks.   Margaret Vargas 03/16/2017    

## 2017-03-27 NOTE — Interval H&P Note (Signed)
History and Physical Interval Note:  03/27/2017 9:34 AM  Margaret Vargas  has presented today for surgery, with the diagnosis of hf  The various methods of treatment have been discussed with the patient and family. After consideration of risks, benefits and other options for treatment, the patient has consented to  Procedure(s): RIGHT HEART CATH (N/A) as a surgical intervention .  The patient's history has been reviewed, patient examined, no change in status, stable for surgery.  I have reviewed the patient's chart and labs.  Questions were answered to the patient's satisfaction.     Joyceann Kruser [employer]

## 2017-03-28 ENCOUNTER — Encounter (HOSPITAL_COMMUNITY): Payer: Self-pay | Admitting: Cardiology

## 2017-03-29 ENCOUNTER — Encounter (HOSPITAL_COMMUNITY)
Admission: RE | Disposition: A | Discharge status: Home / Self Care | Payer: Self-pay | Source: Ambulatory Visit | Attending: Cardiology

## 2017-03-29 ENCOUNTER — Ambulatory Visit (HOSPITAL_COMMUNITY)
Admission: RE | Admit: 2017-03-29 | Discharge: 2017-03-29 | Disposition: A | Discharge status: Home / Self Care | Payer: Medicare Other | Source: Ambulatory Visit | Attending: Cardiology | Admitting: Cardiology

## 2017-03-29 ENCOUNTER — Ambulatory Visit (HOSPITAL_BASED_OUTPATIENT_CLINIC_OR_DEPARTMENT_OTHER)
Admission: RE | Admit: 2017-03-29 | Discharge: 2017-03-29 | Disposition: A | Discharge status: Home / Self Care | Payer: Medicare Other | Source: Ambulatory Visit | Attending: Cardiology | Admitting: Cardiology

## 2017-03-29 ENCOUNTER — Encounter (HOSPITAL_COMMUNITY): Payer: Self-pay

## 2017-03-29 ENCOUNTER — Other Ambulatory Visit (HOSPITAL_COMMUNITY): Payer: Self-pay | Admitting: *Deleted

## 2017-03-29 DIAGNOSIS — G4733 Obstructive sleep apnea (adult) (pediatric): Secondary | ICD-10-CM | POA: Diagnosis not present

## 2017-03-29 DIAGNOSIS — Z6837 Body mass index (BMI) 37.0-37.9, adult: Secondary | ICD-10-CM | POA: Insufficient documentation

## 2017-03-29 DIAGNOSIS — I5022 Chronic systolic (congestive) heart failure: Secondary | ICD-10-CM | POA: Insufficient documentation

## 2017-03-29 DIAGNOSIS — I340 Nonrheumatic mitral (valve) insufficiency: Secondary | ICD-10-CM

## 2017-03-29 DIAGNOSIS — R911 Solitary pulmonary nodule: Secondary | ICD-10-CM | POA: Insufficient documentation

## 2017-03-29 DIAGNOSIS — Z8619 Personal history of other infectious and parasitic diseases: Secondary | ICD-10-CM | POA: Insufficient documentation

## 2017-03-29 DIAGNOSIS — I428 Other cardiomyopathies: Secondary | ICD-10-CM | POA: Insufficient documentation

## 2017-03-29 DIAGNOSIS — Z7982 Long term (current) use of aspirin: Secondary | ICD-10-CM | POA: Insufficient documentation

## 2017-03-29 DIAGNOSIS — R0600 Dyspnea, unspecified: Secondary | ICD-10-CM | POA: Insufficient documentation

## 2017-03-29 DIAGNOSIS — R635 Abnormal weight gain: Secondary | ICD-10-CM | POA: Diagnosis not present

## 2017-03-29 DIAGNOSIS — Z79899 Other long term (current) drug therapy: Secondary | ICD-10-CM | POA: Diagnosis not present

## 2017-03-29 DIAGNOSIS — I081 Rheumatic disorders of both mitral and tricuspid valves: Secondary | ICD-10-CM | POA: Insufficient documentation

## 2017-03-29 DIAGNOSIS — I447 Left bundle-branch block, unspecified: Secondary | ICD-10-CM | POA: Insufficient documentation

## 2017-03-29 DIAGNOSIS — F419 Anxiety disorder, unspecified: Secondary | ICD-10-CM | POA: Diagnosis not present

## 2017-03-29 DIAGNOSIS — F329 Major depressive disorder, single episode, unspecified: Secondary | ICD-10-CM | POA: Insufficient documentation

## 2017-03-29 HISTORY — PX: TEE WITHOUT CARDIOVERSION: SHX5443

## 2017-03-29 SURGERY — ECHOCARDIOGRAM, TRANSESOPHAGEAL
Anesthesia: Moderate Sedation

## 2017-03-29 MED ORDER — FENTANYL CITRATE (PF) 100 MCG/2ML IJ SOLN
INTRAMUSCULAR | Status: DC | PRN
  Administered 2017-03-29: 25 ug via INTRAVENOUS
  Administered 2017-03-29: 50 ug via INTRAVENOUS

## 2017-03-29 MED ORDER — MIDAZOLAM HCL 5 MG/ML IJ SOLN
INTRAMUSCULAR | Status: AC
  Filled 2017-03-29: qty 1

## 2017-03-29 MED ORDER — MIDAZOLAM HCL 10 MG/2ML IJ SOLN
INTRAMUSCULAR | Status: DC | PRN
  Administered 2017-03-29: 1 mg via INTRAVENOUS
  Administered 2017-03-29 (×2): 2 mg via INTRAVENOUS

## 2017-03-29 MED ORDER — MIDAZOLAM HCL 5 MG/ML IJ SOLN
INTRAMUSCULAR | Status: AC
  Filled 2017-03-29: qty 2

## 2017-03-29 MED ORDER — BUTAMBEN-TETRACAINE-BENZOCAINE 2-2-14 % EX AERO
INHALATION_SPRAY | CUTANEOUS | Status: DC | PRN
  Administered 2017-03-29: 2 via TOPICAL

## 2017-03-29 MED ORDER — FENTANYL CITRATE (PF) 100 MCG/2ML IJ SOLN
INTRAMUSCULAR | Status: AC
  Filled 2017-03-29: qty 2

## 2017-03-29 MED ORDER — SODIUM CHLORIDE 0.9 % IV SOLN
INTRAVENOUS | Status: DC
  Administered 2017-03-29: 10:00:00 via INTRAVENOUS

## 2017-03-29 NOTE — Discharge Instructions (Signed)
TEE ° °YOU HAD AN CARDIAC PROCEDURE TODAY: Refer to the procedure report and other information in the discharge instructions given to you for any specific questions about what was found during the examination. If this information does not answer your questions, please call [facility] office at [phone] to clarify.  ° °DIET: Your first meal following the procedure should be a light meal and then it is ok to progress to your normal diet. A half-sandwich or bowl of soup is an example of a good first meal. Heavy or fried foods are harder to digest and may make you feel nauseous or bloated. Drink plenty of fluids but you should avoid alcoholic beverages for 24 hours. If you had a esophageal dilation, please see attached instructions for diet.  ° °ACTIVITY: Your care partner should take you home directly after the procedure. You should plan to take it easy, moving slowly for the rest of the day. You can resume normal activity the day after the procedure however YOU SHOULD NOT DRIVE, use power tools, machinery or perform tasks that involve climbing or major physical exertion for 24 hours (because of the sedation medicines used during the test).  ° °SYMPTOMS TO REPORT IMMEDIATELY: °A cardiologist can be reached at any hour. Please call [phone] for any of the following symptoms:  °Vomiting of blood or coffee ground material  °New, significant abdominal pain  °New, significant chest pain or pain under the shoulder blades  °Painful or persistently difficult swallowing  °New shortness of breath  °Black, tarry-looking or red, bloody stools ° °FOLLOW UP:  °Please also call with any specific questions about appointments or follow up tests. ° ° °Moderate Conscious Sedation, Adult, Care After °These instructions provide you with information about caring for yourself after your procedure. Your health care provider may also give you more specific instructions. Your treatment has been planned according to current medical  practices, but problems sometimes occur. Call your health care provider if you have any problems or questions after your procedure. °What can I expect after the procedure? °After your procedure, it is common: °· To feel sleepy for several hours. °· To feel clumsy and have poor balance for several hours. °· To have poor judgment for several hours. °· To vomit if you eat too soon. ° °Follow these instructions at home: °For at least 24 hours after the procedure: ° °· Do not: °? Participate in activities where you could fall or become injured. °? Drive. °? Use heavy machinery. °? Drink alcohol. °? Take sleeping pills or medicines that cause drowsiness. °? Make important decisions or sign legal documents. °? Take care of children on your own. °· Rest. °Eating and drinking °· Follow the diet recommended by your health care provider. °· If you vomit: °? Drink water, juice, or soup when you can drink without vomiting. °? Make sure you have little or no nausea before eating solid foods. °General instructions °· Have a responsible adult stay with you until you are awake and alert. °· Take over-the-counter and prescription medicines only as told by your health care provider. °· If you smoke, do not smoke without supervision. °· Keep all follow-up visits as told by your health care provider. This is important. °Contact a health care provider if: °· You keep feeling nauseous or you keep vomiting. °· You feel light-headed. °· You develop a rash. °· You have a fever. °Get help right away if: °· You have trouble breathing. °This information is not intended to replace advice   given to you by your health care provider. Make sure you discuss any questions you have with your health care provider. °Document Released: 05/22/2013 Document Revised: 01/04/2016 Document Reviewed: 11/21/2015 °Elsevier Interactive Patient Education © 2018 [employer] ° °

## 2017-03-29 NOTE — OR Nursing (Signed)
0927 25 mcg additional Fentanyl was given during TEE sedation.  Pt c/o discomfort

## 2017-03-29 NOTE — Progress Notes (Signed)
  Echocardiogram Echocardiogram Transesophageal has been performed.  Arvil Chaco 03/29/2017, 10:04 AM

## 2017-03-29 NOTE — H&P (View-Only) (Signed)
PCP: Dr. Paulino Rily HF Cardiology: Dr. Shirlee Latch  66 yo with history of suspected rheumatic fever, nonischemic cardiomyopathy, and severe mitral regurgitation presents for CHF followup.  Patient had an episode in childhood that was thought to be rheumatic fever.  She had a murmur as a child, but never had an echocardiogram.  Starting last summer, she developed exertional dyspnea. This has been progressive since that time.  She never saw a cardiologist until she was admitted in 4/18 with worsening dyspnea and weight gain.  Weight increased about 35 lbs from last summer.   She was diuresed in the hospital and had an echo and a TEE, showing EF 25-30% with rheumatic-appearing mitral valve and severe mitral regurgitation.  She has been seen by Dr Barry Dienes for evaluation for surgical repair/replacement of the MV.  Given improvement in symptoms, he suggested repeating TEE eventually to reassess.   She is doing pretty well.  Went to a vegan camp in [place] recently.  Weight is down another 2 lbs.  She increased Lasix back to 40 mg bid with lower extremity edema. She still gets lightheaded with standing if she rises too fast.  One fall about a month ago, lost balance.  She is short of breath walking up hills/inclines.  She is able to walk 2.5 miles in an hour, not dyspneic on flat ground.  No chest pain. No orthopnea/PND.  She has been having episodes of palpitations, heart feels irregular.  She has had about 4 episodes, each lasting < 30 minutes.     ECG: NSR, LBBB  Labs (4/18): BNP 698, K 4.3, creatinine 1.13 => 0.94, BNP 599 Labs (5/18): BNP 206, K 4.2, creatinine 0.87 Labs (7/18): digoxin 0.4, K 3.7, creatinine 0.98  PMH: 1. OSA 2. LBBB 3. h/o C difficile 4. Rheumatic fever as a child with possible rheumatic heart disease and severe MR.  - Echo (4/18): EF 25-30%, mild RV dilation with mildly decreased RV systolic function, moderate TR, thickened MV leaflets with 2 MR jets (2 defined areas of  malcoaptation), severe MR.   - TEE (4/18): EF 25-30%, mild LV dilation, confirms severe MR with the above features.  5. Chronic systolic CHF: Nonischemic, prior myocarditis + valvular, versus all valvular.  - RHC/LHC (4/18): No significant CAD, severe MR.  Mean RA 18, PA 61/29, mean PCWP 30, CI 1.89.  6. Anxiety/depression 7. Lung nodule by CT   SH: Married, lives in [place], retired Airline pilot, daughter lives in [place], nonsmoker, no ETOH.   FH: Mother with sudden cardiac death at 53.   ROS: All systems reviewed and negative except as per HPI.   Current Outpatient Prescriptions  Medication Sig Dispense Refill  . albuterol (PROVENTIL HFA;VENTOLIN HFA) 108 (90 Base) MCG/ACT inhaler Inhale 2 puffs into the lungs every 6 (six) hours as needed for wheezing or shortness of breath.    . allopurinol (ZYLOPRIM) 300 MG tablet Take 300 mg by mouth daily.    [name] Kitchen ALPRAZolam (XANAX) 0.25 MG tablet Take 0.125 mg by mouth at bedtime as needed for anxiety.    [name] Kitchen aspirin EC 81 MG tablet Take 81 mg by mouth every morning.     . cholecalciferol (VITAMIN D) 1000 UNITS tablet Take 1,000 Units by mouth daily.    . colchicine 0.6 MG tablet Take 0.6 mg by mouth daily as needed (FLAREUPS).    . Cyanocobalamin (VITAMIN B-12) 2500 MCG SUBL Place 2,500 mcg under the tongue daily.    . digoxin (LANOXIN) 0.125 MG tablet Take 1 tablet (  0.125 mg total) by mouth daily. 30 tablet 3  . escitalopram (LEXAPRO) 20 MG tablet Take 20 mg by mouth every morning.     . fluticasone (FLONASE) 50 MCG/ACT nasal spray Place 1 spray into both nostrils daily as needed for allergies or rhinitis.    . fluticasone furoate-vilanterol (BREO ELLIPTA) 200-25 MCG/INH AEPB Inhale 1 puff into the lungs daily.    . furosemide (LASIX) 40 MG tablet Take 1 tablet (40 mg total) by mouth daily. 60 tablet 3  . levalbuterol (XOPENEX) 0.63 MG/3ML nebulizer solution Take 0.63 mg by nebulization every 4 (four) hours as needed for wheezing or shortness of  breath.    . metoprolol succinate (TOPROL-XL) 25 MG 24 hr tablet Take 25 mg (1 tablet) in am and 12.5 mg (1/2 tablet) in pm 45 tablet 6  . omeprazole (PRILOSEC) 20 MG capsule Take 20 mg by mouth daily.    . ondansetron (ZOFRAN ODT) 4 MG disintegrating tablet Take 1 tablet (4 mg total) by mouth every 8 (eight) hours as needed. 10 tablet 0  . Probiotic Product (ALIGN) 4 MG CAPS Take 4 mg by mouth daily.    [name] Kitchen Propylene Glycol (SYSTANE BALANCE) 0.6 % SOLN Place 1 drop into both eyes 2 (two) times daily as needed (DRY EYES).     . sacubitril-valsartan (ENTRESTO) 24-26 MG Take 1 tablet by mouth 2 (two) times daily. 60 tablet 3  . simvastatin (ZOCOR) 20 MG tablet Take 20 mg by mouth at bedtime.     [name] Kitchen spironolactone (ALDACTONE) 25 MG tablet Take 1 tablet (25 mg total) by mouth daily. 90 tablet 3   No current facility-administered medications for this encounter.    BP 130/78 (BP Location: Left Arm, Patient Position: Sitting, Cuff Size: Normal)   Pulse 70   Wt 262 lb (118.8 kg)   SpO2 96%   BMI 38.14 kg/m  General: NAD Neck: No JVD, no thyromegaly or thyroid nodule.  Lungs: Clear to auscultation bilaterally with normal respiratory effort. CV: Nondisplaced PMI.  Heart regular S1/S2, no S3/S4, 2/6 HSM apex.  No peripheral edema.  No carotid bruit.  Normal pedal pulses.  Abdomen: Soft, nontender, no hepatosplenomegaly, no distention.  Skin: Intact without lesions or rashes.  Neurologic: Alert and oriented x 3.  Psych: Normal affect. Extremities: No clubbing or cyanosis.  HEENT: Normal.   Assessment/Plan: 1. Chronic systolic CHF: EF 16-10% on echo in 4/18, nonischemic cardiomyopathy.  No prior echo.  She has severe mitral regurgitation and a rheumatic-appearing mitral valve.  It is hard to know which was the initial problem (cardiomyopathy or mitral regurgitation), and hard to know how related they are.  Cannot rule out valvular cardiomyopathy due to long-term severe mitral regurgitation (insidious  onset of symptoms almost a year ago, and had never had an echo).  Alternatively, she could have a nonischemic cardiomyopathy from some other etiology like myocarditis and have a co-existing rheumatic mitral valve with perhaps worsening of MR from annular dilatation (secondary MR). She continues to improve, weight down again.  Not volume overloaded on exam.  Now NYHA class II.  Medication titration has been limited somewhat by orthostatic symptoms though her resting BP is 130/78 today.  - Increase Toprol XL to 25 qam/12.5 qpm.    - Continue Lasix 40 mg bid, did not tolerate decreased dose.   - Continue Entresto and spironolactone at current doses.   - Continue digoxin, recent level ok.  - Can add Bidil in the future if orthostatic symptoms resolve.  -  I will arrange for RHC along with TEE this month.  We discussed risks/benefits and she agrees to proceed.  - Will reassess LV EF with TEE in 8/18.  If EF remains low, she would be candidate for CRT-D.  2. Rheumatic heart disease: Severe MR with a rheumatic-appearing valve, also moderate TR.  There are 2 jets of MR from 2 defined areas of malcoaptation.  As above, do not know if there is a co-existing nonischemic cardiomyopathy or if long-standing severe MR led to a cardiomyopathy.  Mitral regurgitation is likely mixed => rheumatic mitral valve disease but also concern for a component of secondary MR from annular dilatation.  Not straightforward situation, would be high risk for repair/replacement of MV with low EF though now she seems better compensated.  Murmur is not as loud.  - I will set up a repeat TEE. If MR is significantly improved, may be a large functional component and may be able to hold off on surgery.   3. LBBB: If EF remains low, she will be a CRT-D candidate. 4. Lung nodule: Repeat CT ordered through Dr Orvan July office.   5. Palpitations: She is predisposed to atrial fibrillation with low EF and mitral regurgitation.  She has been having runs  of irregular heart beats.  - I will arrange for 30 day monitor to assess for atrial fibrillation.   Followup in 3 wks.   Marca Ancona 03/16/2017

## 2017-03-29 NOTE — CV Procedure (Signed)
Procedure: TEE  Indication: Mitral regurgitation  Sedation: Versed 5 mg IV, Fentanyl 75 mcg IV  Findings: Please see echo section for full report.  The left ventricle was mildly dilated with mild LV hypertrophy.  EF 30-35% with septal-lateral dyssynchrony.  Normal right ventricular size and systolic function.  Normal RA size.  Moderate left atrial enlargement, no LA appendage thrombus.  There was trivial TR with peak RV-RA gradient 31 mmHg.  The aortic valve was trileaflet with no stenosis or regurgitation.  The mitral valve was mildly thickened.  There was no prolapse/flail.  There were 2 jets of mitral regurgitation, 1 larger and one significantly smaller.  Using the large jet, PISA ERO was 0.16 cm^2 with regurgitant volume 32 mL.  There was no systolic flow reversal in the pulmonary vein doppler pattern.  Overall, I think mitral regurgitation was moderate.  Normal caliber thoracic aorta with mild plaque.   Impression:  1. Moderate mitral regurgitation, likely functional. 2. Mildly dilated LV with EF 30-35% with septal-lateral dyssynchrony.   I do not think that the patient needs MV repair.  I think that she could benefit from CRT-D with a wide LBBB and dyssynchrony.  This may improve MR further.   Marca Ancona 03/29/2017 9:46 AM

## 2017-03-29 NOTE — Interval H&P Note (Signed)
History and Physical Interval Note:  03/29/2017 9:13 AM  Margaret Vargas  has presented today for surgery, with the diagnosis of MITRAL REGURGITATION  The various methods of treatment have been discussed with the patient and family. After consideration of risks, benefits and other options for treatment, the patient has consented to  Procedure(s): TRANSESOPHAGEAL ECHOCARDIOGRAM (TEE) (N/A) as a surgical intervention .  The patient's history has been reviewed, patient examined, no change in status, stable for surgery.  I have reviewed the patient's chart and labs.  Questions were answered to the patient's satisfaction.     Dalton [employer]

## 2017-03-30 ENCOUNTER — Encounter (HOSPITAL_COMMUNITY): Payer: Self-pay | Admitting: Cardiology

## 2017-03-31 ENCOUNTER — Encounter (HOSPITAL_COMMUNITY): Payer: Self-pay | Admitting: Cardiology

## 2017-04-04 ENCOUNTER — Other Ambulatory Visit (HOSPITAL_COMMUNITY): Payer: Self-pay | Admitting: Cardiology

## 2017-04-04 ENCOUNTER — Ambulatory Visit (INDEPENDENT_AMBULATORY_CARE_PROVIDER_SITE_OTHER): Payer: Medicare Other | Admitting: Internal Medicine

## 2017-04-04 ENCOUNTER — Encounter: Payer: Self-pay | Admitting: Internal Medicine

## 2017-04-04 VITALS — HR 69

## 2017-04-04 DIAGNOSIS — I447 Left bundle-branch block, unspecified: Secondary | ICD-10-CM | POA: Diagnosis not present

## 2017-04-04 DIAGNOSIS — I5022 Chronic systolic (congestive) heart failure: Secondary | ICD-10-CM | POA: Diagnosis not present

## 2017-04-04 DIAGNOSIS — I472 Ventricular tachycardia: Secondary | ICD-10-CM

## 2017-04-04 DIAGNOSIS — I428 Other cardiomyopathies: Secondary | ICD-10-CM | POA: Diagnosis not present

## 2017-04-04 DIAGNOSIS — I4729 Other ventricular tachycardia: Secondary | ICD-10-CM

## 2017-04-04 MED ORDER — SACUBITRIL-VALSARTAN 24-26 MG PO TABS: 1.0000 | ORAL_TABLET | Freq: Two times a day (BID) | ORAL | 3 refills | Status: DC

## 2017-04-04 NOTE — Patient Instructions (Addendum)
Medication Instructions:  Your physician recommends that you continue on your current medications as directed. Please refer to the Current Medication list given to you today.   Labwork: You will get labs drawn today:  CBC and BMET.   Testing/Procedures:  You are scheduled for an implantable defibrillator on 04/06/2017 at 3:00 pm.    Your physician has recommended that you have a defibrillator inserted. An implantable cardioverter defibrillator (ICD) is a small device that is placed in your chest or, in rare cases, your abdomen. This device uses electrical pulses or shocks to help control life-threatening, irregular heartbeats that could lead the heart to suddenly stop beating (sudden cardiac arrest). Leads are attached to the ICD that goes into your heart. This is done in the hospital and usually requires an overnight stay. Please see the instruction sheet given to you today for more information.   Follow-Up: You will follow up with the device clinic in 10-14 days for a wound check. You will follow up with Dr. Ladona Ridgel in 91 days.  Any Other Special Instructions Will Be Listed Below (If Applicable).  Please arrive at the [facility] main entrance of [facility] hospital at: 1:00 pm on 04/06/2017. You may have a light breakfast before 7 am.  Do not eat anything after 7 am.   Do not take any medications the morning of the procedure Use surgical scrub as directed (follow instruction sheet given you today) Plan for one night stay You will need someone to drive you home at discharge   If you need a refill on your cardiac medications before your next appointment, please call your pharmacy.

## 2017-04-04 NOTE — Progress Notes (Signed)
HPI [name] is referred today for consideration for biventricular ICD insertion by Dr. Shirlee Latch. The patient is a pleasant 66 year old obese woman who carries a remote history of rheumatic heart disease, and was found to have mitral regurgitation and severe left ventricular dysfunction. It was initially thought that she might have primary valve problem, although after initiation of guideline directed heart failure therapy, and undergoing transesophageal echo, it appears that she has mostly annular dilatation of her mitral valve annulus secondary to chronic systolic heart failure. The patient is frustrated by her inability to lose weight. She denies medical or dietary noncompliance. Her ejection fraction has improved from 20% to 30% with maximal medications. She has left bundle branch block. Her heart failure is currently class III. She has occasional peripheral edema. No syncope. Allergies  Allergen Reactions  . Other Other (See Comments)    Bandaids and some adhesives cause red skin. (pls use paper tape)  . Adhesive [Tape] Itching and Rash  . Morphine And Related Itching     Current Outpatient Prescriptions  Medication Sig Dispense Refill  . acetaminophen (TYLENOL) 650 MG CR tablet Take 1,300 mg by mouth daily as needed for pain.    [name] Kitchen albuterol (PROVENTIL HFA;VENTOLIN HFA) 108 (90 Base) MCG/ACT inhaler Inhale 2 puffs into the lungs every 6 (six) hours as needed for wheezing or shortness of breath.    . allopurinol (ZYLOPRIM) 300 MG tablet Take 300 mg by mouth daily.    [name] Kitchen ALPRAZolam (XANAX) 0.25 MG tablet Take 0.125 mg by mouth daily as needed for anxiety.     [name] Kitchen aspirin EC 81 MG tablet Take 81 mg by mouth every morning.     . cholecalciferol (VITAMIN D) 1000 UNITS tablet Take 1,000 Units by mouth daily.    . colchicine 0.6 MG tablet Take 0.6 mg by mouth daily as needed (FLAREUPS).    . Cyanocobalamin (VITAMIN B-12) 2500 MCG SUBL Place 2,500 mcg under the tongue daily.    . digoxin  (LANOXIN) 0.125 MG tablet Take 1 tablet (0.125 mg total) by mouth daily. 30 tablet 3  . escitalopram (LEXAPRO) 20 MG tablet Take 20 mg by mouth every morning.     . fluticasone (FLONASE) 50 MCG/ACT nasal spray Place 1 spray into both nostrils daily as needed for allergies or rhinitis.    . fluticasone furoate-vilanterol (BREO ELLIPTA) 200-25 MCG/INH AEPB Inhale 1 puff into the lungs daily.    . furosemide (LASIX) 40 MG tablet Take 1 tablet (40 mg total) by mouth daily. 60 tablet 3  . levalbuterol (XOPENEX) 0.63 MG/3ML nebulizer solution Take 0.63 mg by nebulization every 4 (four) hours as needed for wheezing or shortness of breath.    . levocetirizine (XYZAL) 5 MG tablet Take 5 mg by mouth every evening.    . metoprolol succinate (TOPROL-XL) 25 MG 24 hr tablet Take 25 mg (1 tablet) in am and 12.5 mg (1/2 tablet) in pm 45 tablet 6  . omeprazole (PRILOSEC) 20 MG capsule Take 20 mg by mouth daily.    [name] Kitchen Propylene Glycol (SYSTANE BALANCE) 0.6 % SOLN Place 1 drop into both eyes 2 (two) times daily as needed (DRY EYES).     . sacubitril-valsartan (ENTRESTO) 24-26 MG Take 1 tablet by mouth 2 (two) times daily. 180 tablet 3  . simvastatin (ZOCOR) 20 MG tablet Take 20 mg by mouth at bedtime.     [name] Kitchen spironolactone (ALDACTONE) 25 MG tablet Take 25 mg by mouth daily.  No current facility-administered medications for this visit.      Past Medical History:  Diagnosis Date  . Acute CHF (congestive heart failure) (HCC) 11/16/2016   Margaret Vargas 11/16/2016  . Anxiety    Margaret Vargas 11/16/2016  . Arthritis    "neck" (11/16/2016)  . Asthma   . Chronic bronchitis (HCC)   . Chronic neck pain   . Depression    Margaret Vargas 11/16/2016  . Gout   . 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 Heart murmur    "when I was a child"  . High cholesterol   . History of blood transfusion    "w/a back OR"  . Hypertension    Margaret Vargas 11/16/2016  . Incidental lung nodule, greater than or equal to 8mm 01/02/2017   Probable granuloma right upper  lobe - needs f/u imaging  . Pre-diabetes   . Sleep apnea    "need a mask/tests" (11/16/2016)  . Stroke [facility]) 05/2008   "mini stroke"; denies residual on 11/16/2016  . Thyroid nodule   . TIA (transient ischemic attack) "several"    ROS:   All systems reviewed and negative except as noted in the HPI.   Past Surgical History:  Procedure Laterality Date  . BACK SURGERY    . INNER EAR SURGERY Right 1980s  . LAPAROSCOPIC CHOLECYSTECTOMY  1990s  . NEUROPLASTY / TRANSPOSITION MEDIAN NERVE AT CARPAL TUNNEL Left ~ 2010  . POSTERIOR FUSION LUMBAR SPINE  2005  . RIGHT HEART CATH N/A 03/27/2017   Procedure: RIGHT HEART CATH;  Surgeon: Laurey Morale, MD;  Location: [facility] INVASIVE CV LAB;  Service: Cardiovascular;  Laterality: N/A;  . RIGHT OOPHORECTOMY Right 1990s  . RIGHT/LEFT HEART CATH AND CORONARY ANGIOGRAPHY N/A 11/21/2016   Procedure: Right/Left Heart Cath and Coronary Angiography;  Surgeon: Kathleene Hazel, MD;  Location: [facility] INVASIVE CV LAB;  Service: Cardiovascular;  Laterality: N/A;  . TEAR DUCT PROBING Bilateral 2000s  . TEE WITHOUT CARDIOVERSION N/A 11/22/2016   Procedure: TRANSESOPHAGEAL ECHOCARDIOGRAM (TEE);  Surgeon: Chrystie Nose, MD;  Location: [facility] ENDOSCOPY;  Service: Cardiovascular;  Laterality: N/A;  . TEE WITHOUT CARDIOVERSION N/A 03/29/2017   Procedure: TRANSESOPHAGEAL ECHOCARDIOGRAM (TEE);  Surgeon: Laurey Morale, MD;  Location: [facility] ENDOSCOPY;  Service: Cardiovascular;  Laterality: N/A;  . VAGINAL HYSTERECTOMY  1989     Family History  Problem Relation Age of Onset  . Hypertension Mother      Social History   Social History  . Marital status: Married    Spouse name: N/A  . Number of children: N/A  . Years of education: N/A   Occupational History  . Not on file.   Social History Main Topics  . Smoking status: Former Smoker    Packs/day: 0.75    Years: 13.00    Types: Cigarettes    Quit date: 1989  . Smokeless tobacco: Never Used  . Alcohol use Yes       Comment: 11/16/2016 "might have 3-4 drinks/month"  . Drug use: No     Comment: 11/16/2016 "I smoked marijuana in college"  . Sexual activity: Not on file   Other Topics Concern  . Not on file   Social History Narrative  . No narrative on file     Pulse 69   LMP  (LMP Unknown)  Blood pressure 122/72, respirations 18, weight 261 Physical Exam:  Well appearing obese 66 year old woman, NAD HEENT: Unremarkable Neck:  8 cm JVD, no thyromegally Lymphatics:  No adenopathy Back:  No  CVA tenderness Lungs:  Clear with no wheezes, rales, or rhonchi HEART:  Regular rate rhythm, no murmurs, no rubs, no clicks, split S2 Abd:  soft, positive bowel sounds, no organomegally, no rebound, no guarding Ext:  2 plus pulses, trace peripheral edema, no cyanosis, no clubbing Skin:  No rashes no nodules Neuro:  CN II through XII intact, motor grossly intact  EKG - sinus rhythm with left bundle branch block, QRS duration 156 ms   Assess/Plan: 1. Chronic systolic heart failure - the patient is on maximal medical therapy, and has persistent class III symptoms. She has left bundle branch block. I discussed the risk, benefits, goals, and expectations of biventricular ICD insertion with the patient, and she wishes to proceed. 2. Left bundle branch block - she has not had syncope. Hopefully her QRS were narrow with resynchronization therapy 3. Mitral regurgitation - she appears to have mitral annular regurgitation. Hopefully this will improve with successful resynchronization. 4. Obesity - we discussed the importance of weight loss. I suspect this will be an ongoing problem with the patient. 5. Palpitations - the patient today mentions that she feels like her heart is beating irregular for brief periods. I have recommended that she undergo watchful waiting. Once her device is placed, we will have a better idea of whether or not she is having atrial, ventricular, or other types of ectopy. She could be having  paroxysmal atrial fibrillation although this is not been documented.  Lewayne Bunting, M.D.

## 2017-04-05 ENCOUNTER — Telehealth (HOSPITAL_COMMUNITY): Payer: Self-pay

## 2017-04-05 ENCOUNTER — Other Ambulatory Visit (HOSPITAL_COMMUNITY): Payer: Self-pay | Admitting: Cardiology

## 2017-04-05 ENCOUNTER — Telehealth: Payer: Self-pay

## 2017-04-05 LAB — BASIC METABOLIC PANEL
BUN / CREAT RATIO: 17 (ref 12–28)
BUN: 13 mg/dL (ref 8–27)
CALCIUM: 9.9 mg/dL (ref 8.7–10.3)
CHLORIDE: 104 mmol/L (ref 96–106)
CO2: 22 mmol/L (ref 20–29)
Creatinine, Ser: 0.75 mg/dL (ref 0.57–1.00)
GFR, EST AFRICAN AMERICAN: 97 mL/min/{1.73_m2} (ref >59)
GFR, EST NON AFRICAN AMERICAN: 84 mL/min/{1.73_m2} (ref >59)
Glucose: 104 mg/dL — ABNORMAL HIGH (ref 65–99)
POTASSIUM: 4.2 mmol/L (ref 3.5–5.2)
SODIUM: 144 mmol/L (ref 134–144)

## 2017-04-05 LAB — CBC WITH DIFFERENTIAL/PLATELET
BASOS ABS: 0 10*3/uL (ref 0.0–0.2)
BASOS: 0 %
EOS (ABSOLUTE): 0.1 10*3/uL (ref 0.0–0.4)
Eos: 2 %
Hematocrit: 41.4 % (ref 34.0–46.6)
Hemoglobin: 14.1 g/dL (ref 11.1–15.9)
Immature Grans (Abs): 0 10*3/uL (ref 0.0–0.1)
Immature Granulocytes: 0 %
LYMPHS ABS: 2 10*3/uL (ref 0.7–3.1)
Lymphs: 32 %
MCH: 32.2 pg (ref 26.6–33.0)
MCHC: 34.1 g/dL (ref 31.5–35.7)
MCV: 95 fL (ref 79–97)
MONOS ABS: 0.6 10*3/uL (ref 0.1–0.9)
Monocytes: 10 %
NEUTROS ABS: 3.4 10*3/uL (ref 1.4–7.0)
Neutrophils: 56 %
PLATELETS: 320 10*3/uL (ref 150–379)
RBC: 4.38 x10E6/uL (ref 3.77–5.28)
RDW: 17.3 % — ABNORMAL HIGH (ref 12.3–15.4)
WBC: 6.1 10*3/uL (ref 3.4–10.8)

## 2017-04-05 NOTE — Telephone Encounter (Signed)
I discussed BiV-ICD again with her.  She understands procedure and is ready for implantation.

## 2017-04-05 NOTE — Telephone Encounter (Signed)
Call placed to Pt.  Pt able to change procedure time to 0730, with arrival time of 0530.  Discussed with Pt why she was getting an ICD placed again.  All questions answered.

## 2017-04-05 NOTE — Telephone Encounter (Signed)
Patient calling CHF clinic to speak to Dr. Shirlee Latch.  States she was seen by [facility] EP and was scheduled for ICD placement. Patient would like to speak to Dr. Shirlee Latch to get his input on risk vs benefit of this procedure and device, no specific questions. Will forward to provider to see if he is willing to call patient to speak to her on his opinion on the matter.  Ave Filter, RN

## 2017-04-06 ENCOUNTER — Ambulatory Visit (HOSPITAL_COMMUNITY)
Admission: RE | Admit: 2017-04-06 | Discharge: 2017-04-07 | Disposition: A | Discharge status: Home / Self Care | Payer: Medicare Other | Source: Ambulatory Visit | Attending: Internal Medicine | Admitting: Internal Medicine

## 2017-04-06 ENCOUNTER — Encounter (HOSPITAL_COMMUNITY)
Admission: RE | Disposition: A | Discharge status: Home / Self Care | Payer: Self-pay | Source: Ambulatory Visit | Attending: Internal Medicine

## 2017-04-06 ENCOUNTER — Encounter (HOSPITAL_COMMUNITY): Payer: Self-pay | Admitting: Internal Medicine

## 2017-04-06 DIAGNOSIS — Z885 Allergy status to narcotic agent status: Secondary | ICD-10-CM | POA: Insufficient documentation

## 2017-04-06 DIAGNOSIS — Z006 Encounter for examination for normal comparison and control in clinical research program: Secondary | ICD-10-CM | POA: Insufficient documentation

## 2017-04-06 DIAGNOSIS — Z79899 Other long term (current) drug therapy: Secondary | ICD-10-CM | POA: Insufficient documentation

## 2017-04-06 DIAGNOSIS — Z95810 Presence of automatic (implantable) cardiac defibrillator: Secondary | ICD-10-CM

## 2017-04-06 DIAGNOSIS — E669 Obesity, unspecified: Secondary | ICD-10-CM | POA: Diagnosis not present

## 2017-04-06 DIAGNOSIS — R7303 Prediabetes: Secondary | ICD-10-CM | POA: Diagnosis not present

## 2017-04-06 DIAGNOSIS — I5022 Chronic systolic (congestive) heart failure: Secondary | ICD-10-CM | POA: Diagnosis not present

## 2017-04-06 DIAGNOSIS — I447 Left bundle-branch block, unspecified: Secondary | ICD-10-CM | POA: Diagnosis not present

## 2017-04-06 DIAGNOSIS — F329 Major depressive disorder, single episode, unspecified: Secondary | ICD-10-CM | POA: Insufficient documentation

## 2017-04-06 DIAGNOSIS — Z87891 Personal history of nicotine dependence: Secondary | ICD-10-CM | POA: Diagnosis not present

## 2017-04-06 DIAGNOSIS — Z6838 Body mass index (BMI) 38.0-38.9, adult: Secondary | ICD-10-CM | POA: Insufficient documentation

## 2017-04-06 DIAGNOSIS — Z7982 Long term (current) use of aspirin: Secondary | ICD-10-CM | POA: Insufficient documentation

## 2017-04-06 DIAGNOSIS — G4733 Obstructive sleep apnea (adult) (pediatric): Secondary | ICD-10-CM | POA: Diagnosis not present

## 2017-04-06 DIAGNOSIS — I110 Hypertensive heart disease with heart failure: Secondary | ICD-10-CM | POA: Diagnosis not present

## 2017-04-06 DIAGNOSIS — I429 Cardiomyopathy, unspecified: Secondary | ICD-10-CM | POA: Diagnosis not present

## 2017-04-06 DIAGNOSIS — Z8673 Personal history of transient ischemic attack (TIA), and cerebral infarction without residual deficits: Secondary | ICD-10-CM | POA: Insufficient documentation

## 2017-04-06 DIAGNOSIS — E7800 Pure hypercholesterolemia, unspecified: Secondary | ICD-10-CM | POA: Diagnosis not present

## 2017-04-06 DIAGNOSIS — I340 Nonrheumatic mitral (valve) insufficiency: Secondary | ICD-10-CM | POA: Diagnosis not present

## 2017-04-06 DIAGNOSIS — F419 Anxiety disorder, unspecified: Secondary | ICD-10-CM | POA: Diagnosis not present

## 2017-04-06 HISTORY — DX: Presence of automatic (implantable) cardiac defibrillator: Z95.810

## 2017-04-06 HISTORY — PX: BIV ICD INSERTION CRT-D: EP1195

## 2017-04-06 HISTORY — PX: INSERTION OF ICD: SHX6689

## 2017-04-06 LAB — SURGICAL PCR SCREEN
MRSA, PCR: NEGATIVE
Staphylococcus aureus: POSITIVE — AB

## 2017-04-06 SURGERY — BIV ICD INSERTION CRT-D

## 2017-04-06 MED ORDER — SODIUM CHLORIDE 0.9 % IR SOLN
Status: AC
  Filled 2017-04-06: qty 2

## 2017-04-06 MED ORDER — MUPIROCIN 2 % EX OINT
TOPICAL_OINTMENT | CUTANEOUS | Status: AC
  Filled 2017-04-06: qty 22

## 2017-04-06 MED ORDER — SIMVASTATIN 20 MG PO TABS
20.0000 mg | ORAL_TABLET | Freq: Every day | ORAL | Status: DC
  Administered 2017-04-06: 20 mg via ORAL
  Filled 2017-04-06: qty 1

## 2017-04-06 MED ORDER — MIDAZOLAM HCL 5 MG/5ML IJ SOLN
INTRAMUSCULAR | Status: AC
  Filled 2017-04-06: qty 5

## 2017-04-06 MED ORDER — SODIUM CHLORIDE 0.9 % IV SOLN
INTRAVENOUS | Status: DC
  Administered 2017-04-06: 07:00:00 via INTRAVENOUS

## 2017-04-06 MED ORDER — LIDOCAINE HCL (PF) 1 % IJ SOLN
INTRAMUSCULAR | Status: AC
  Filled 2017-04-06: qty 30

## 2017-04-06 MED ORDER — FENTANYL CITRATE (PF) 100 MCG/2ML IJ SOLN
INTRAMUSCULAR | Status: AC
  Filled 2017-04-06: qty 2

## 2017-04-06 MED ORDER — SPIRONOLACTONE 25 MG PO TABS
25.0000 mg | ORAL_TABLET | Freq: Every day | ORAL | Status: DC
  Administered 2017-04-06 – 2017-04-07 (×2): 25 mg via ORAL
  Filled 2017-04-06 (×2): qty 1

## 2017-04-06 MED ORDER — CEFAZOLIN SODIUM-DEXTROSE 1-4 GM/50ML-% IV SOLN
1.0000 g | Freq: Four times a day (QID) | INTRAVENOUS | Status: AC
  Administered 2017-04-06 – 2017-04-07 (×3): 1 g via INTRAVENOUS
  Filled 2017-04-06 (×3): qty 50

## 2017-04-06 MED ORDER — LIDOCAINE HCL (PF) 1 % IJ SOLN
INTRAMUSCULAR | Status: DC | PRN
  Administered 2017-04-06: 45 mL

## 2017-04-06 MED ORDER — ACETAMINOPHEN 325 MG PO TABS
325.0000 mg | ORAL_TABLET | ORAL | Status: DC | PRN
  Administered 2017-04-06 (×2): 650 mg via ORAL
  Filled 2017-04-06 (×2): qty 2

## 2017-04-06 MED ORDER — ESCITALOPRAM OXALATE 10 MG PO TABS
20.0000 mg | ORAL_TABLET | Freq: Every day | ORAL | Status: DC
  Administered 2017-04-06 – 2017-04-07 (×2): 20 mg via ORAL
  Filled 2017-04-06 (×3): qty 2

## 2017-04-06 MED ORDER — HYDROCODONE-ACETAMINOPHEN 5-325 MG PO TABS
1.0000 | ORAL_TABLET | ORAL | Status: DC | PRN
  Administered 2017-04-06 – 2017-04-07 (×3): 1 via ORAL
  Filled 2017-04-06 (×3): qty 1

## 2017-04-06 MED ORDER — CHLORHEXIDINE GLUCONATE 4 % EX LIQD: 60.0000 mL | Freq: Once | CUTANEOUS | Status: DC

## 2017-04-06 MED ORDER — DIGOXIN 125 MCG PO TABS
0.1250 mg | ORAL_TABLET | Freq: Every day | ORAL | Status: DC
  Administered 2017-04-06 – 2017-04-07 (×2): 0.125 mg via ORAL
  Filled 2017-04-06 (×2): qty 1

## 2017-04-06 MED ORDER — CEFAZOLIN SODIUM-DEXTROSE 2-4 GM/100ML-% IV SOLN
2.0000 g | INTRAVENOUS | Status: AC
  Administered 2017-04-06: 2 g via INTRAVENOUS

## 2017-04-06 MED ORDER — IOPAMIDOL (ISOVUE-370) INJECTION 76%
INTRAVENOUS | Status: DC | PRN
  Administered 2017-04-06: 10 mL via INTRAVENOUS

## 2017-04-06 MED ORDER — ALBUTEROL SULFATE (2.5 MG/3ML) 0.083% IN NEBU: 3.0000 mL | INHALATION_SOLUTION | Freq: Four times a day (QID) | RESPIRATORY_TRACT | Status: DC | PRN

## 2017-04-06 MED ORDER — SACUBITRIL-VALSARTAN 24-26 MG PO TABS
1.0000 | ORAL_TABLET | Freq: Two times a day (BID) | ORAL | Status: DC
  Administered 2017-04-06 – 2017-04-07 (×2): 1 via ORAL
  Filled 2017-04-06 (×2): qty 1

## 2017-04-06 MED ORDER — POLYVINYL ALCOHOL 1.4 % OP SOLN: 1.0000 [drp] | Freq: Two times a day (BID) | OPHTHALMIC | Status: DC | PRN

## 2017-04-06 MED ORDER — SODIUM CHLORIDE 0.9 % IR SOLN
80.0000 mg | Status: AC
  Administered 2017-04-06: 80 mg

## 2017-04-06 MED ORDER — CEFAZOLIN SODIUM-DEXTROSE 2-4 GM/100ML-% IV SOLN
INTRAVENOUS | Status: AC
  Filled 2017-04-06: qty 100

## 2017-04-06 MED ORDER — VITAMIN B-12 1000 MCG PO TABS
2500.0000 ug | ORAL_TABLET | Freq: Every day | ORAL | Status: DC
  Administered 2017-04-07: 2500 ug via ORAL
  Filled 2017-04-06 (×2): qty 3

## 2017-04-06 MED ORDER — FLUTICASONE PROPIONATE 50 MCG/ACT NA SUSP: 1.0000 | Freq: Every day | NASAL | Status: DC | PRN

## 2017-04-06 MED ORDER — HEPARIN (PORCINE) IN NACL 2-0.9 UNIT/ML-% IJ SOLN
INTRAMUSCULAR | Status: AC | PRN
  Administered 2017-04-06: 500 mL via INTRAVENOUS

## 2017-04-06 MED ORDER — METOPROLOL SUCCINATE ER 25 MG PO TB24
25.0000 mg | ORAL_TABLET | Freq: Every day | ORAL | Status: DC
  Administered 2017-04-07: 25 mg via ORAL
  Filled 2017-04-06 (×2): qty 1

## 2017-04-06 MED ORDER — ALLOPURINOL 300 MG PO TABS
300.0000 mg | ORAL_TABLET | Freq: Every day | ORAL | Status: DC
  Administered 2017-04-07: 300 mg via ORAL
  Filled 2017-04-06 (×2): qty 1

## 2017-04-06 MED ORDER — SODIUM CHLORIDE 0.9 % IV SOLN
INTRAVENOUS | Status: AC | PRN
  Administered 2017-04-06: 250 mL via INTRAVENOUS

## 2017-04-06 MED ORDER — CETIRIZINE HCL 10 MG PO TABS
5.0000 mg | ORAL_TABLET | Freq: Every evening | ORAL | Status: DC
  Administered 2017-04-06: 5 mg via ORAL
  Filled 2017-04-06 (×2): qty 1

## 2017-04-06 MED ORDER — ONDANSETRON HCL 4 MG/2ML IJ SOLN: 4.0000 mg | Freq: Four times a day (QID) | INTRAMUSCULAR | Status: DC | PRN

## 2017-04-06 MED ORDER — MIDAZOLAM HCL 5 MG/5ML IJ SOLN
INTRAMUSCULAR | Status: DC | PRN
Start: 2017-04-06 — End: 2017-04-06
  Administered 2017-04-06: 1 mg via INTRAVENOUS
  Administered 2017-04-06: 2 mg via INTRAVENOUS
  Administered 2017-04-06 (×8): 1 mg via INTRAVENOUS
  Administered 2017-04-06: 2 mg via INTRAVENOUS
  Administered 2017-04-06: 1 mg via INTRAVENOUS

## 2017-04-06 MED ORDER — ALPRAZOLAM 0.25 MG PO TABS
0.1250 mg | ORAL_TABLET | Freq: Every day | ORAL | Status: DC | PRN
  Administered 2017-04-06: 0.125 mg via ORAL
  Filled 2017-04-06: qty 1

## 2017-04-06 MED ORDER — IOPAMIDOL (ISOVUE-370) INJECTION 76%
INTRAVENOUS | Status: AC
  Filled 2017-04-06: qty 50

## 2017-04-06 MED ORDER — SODIUM CHLORIDE 0.9 % IR SOLN
Status: DC | PRN
  Administered 2017-04-06: 10:00:00

## 2017-04-06 MED ORDER — MUPIROCIN 2 % EX OINT
TOPICAL_OINTMENT | Freq: Once | CUTANEOUS | Status: AC
  Administered 2017-04-06: 1 via NASAL

## 2017-04-06 MED ORDER — VITAMIN D 1000 UNITS PO TABS
1000.0000 [IU] | ORAL_TABLET | Freq: Every day | ORAL | Status: DC
  Administered 2017-04-06 – 2017-04-07 (×2): 1000 [IU] via ORAL
  Filled 2017-04-06 (×2): qty 1

## 2017-04-06 MED ORDER — HEPARIN (PORCINE) IN NACL 2-0.9 UNIT/ML-% IJ SOLN
INTRAMUSCULAR | Status: AC
  Filled 2017-04-06: qty 500

## 2017-04-06 MED ORDER — FLUTICASONE FUROATE-VILANTEROL 200-25 MCG/INH IN AEPB
1.0000 | INHALATION_SPRAY | Freq: Every day | RESPIRATORY_TRACT | Status: DC
  Administered 2017-04-07: 10:00:00 1 via RESPIRATORY_TRACT
  Filled 2017-04-06: qty 28

## 2017-04-06 MED ORDER — COLCHICINE 0.6 MG PO TABS: 0.6000 mg | ORAL_TABLET | Freq: Every day | ORAL | Status: DC | PRN

## 2017-04-06 MED ORDER — FENTANYL CITRATE (PF) 100 MCG/2ML IJ SOLN
INTRAMUSCULAR | Status: DC | PRN
  Administered 2017-04-06: 12.5 ug via INTRAVENOUS
  Administered 2017-04-06: 25 ug via INTRAVENOUS
  Administered 2017-04-06 (×5): 12.5 ug via INTRAVENOUS
  Administered 2017-04-06: 25 ug via INTRAVENOUS

## 2017-04-06 MED ORDER — FUROSEMIDE 40 MG PO TABS
40.0000 mg | ORAL_TABLET | Freq: Every day | ORAL | Status: DC
  Administered 2017-04-07: 40 mg via ORAL
  Filled 2017-04-06 (×2): qty 1

## 2017-04-06 MED ORDER — ASPIRIN EC 81 MG PO TBEC
81.0000 mg | DELAYED_RELEASE_TABLET | Freq: Every day | ORAL | Status: DC
  Administered 2017-04-06 – 2017-04-07 (×2): 81 mg via ORAL
  Filled 2017-04-06 (×2): qty 1

## 2017-04-06 SURGICAL SUPPLY — 17 items
CABLE SURGICAL S-101-97-12 (CABLE) ×3 IMPLANT
CATH ACUITYPRO 45CM H 9F (CATHETERS) ×3 IMPLANT
CATH ACUITYPRO IC 90 7F (CATHETERS) ×2
CATH GD CS-IC 90 CVD 60X7FR (CATHETERS) ×1 IMPLANT
CATH HEX JOSEPH 2-5-2 65CM 6F (CATHETERS) ×3 IMPLANT
ICD VIGILANT DF4 G247 (ICD Generator) ×3 IMPLANT
INGEVITY MRI 7741-52CM (Lead) ×3 IMPLANT
LEAD ACUITY X4 4674 (Lead) ×3 IMPLANT
LEAD PACING INGEVITY MRI 52CM (Lead) ×1 IMPLANT
LEAD RELIANCE G DF4 0293 (Lead) ×3 IMPLANT
PAD DEFIB LIFELINK (PAD) ×3 IMPLANT
SHEATH CLASSIC 7F (SHEATH) ×3 IMPLANT
SHEATH CLASSIC 9.5F (SHEATH) ×3 IMPLANT
SHEATH CLASSIC 9F (SHEATH) ×3 IMPLANT
TRAY PACEMAKER INSERTION (PACKS) ×3 IMPLANT
WIRE ACUITY WHISPER EDS 4648 (WIRE) ×3 IMPLANT
WIRE MAILMAN 182CM (WIRE) ×3 IMPLANT

## 2017-04-06 NOTE — Interval H&P Note (Signed)
History and Physical Interval Note:  04/06/2017 7:36 AM  Margaret Vargas  has presented today for surgery, with the diagnosis of chronic systolic heart failure, LBBB  The various methods of treatment have been discussed with the patient and family. After consideration of risks, benefits and other options for treatment, the patient has consented to  Procedure(s): BIV ICD INSERTION CRT-D (N/A) as a surgical intervention .  The patient's history has been reviewed, patient examined, no change in status, stable for surgery.  I have reviewed the patient's chart and labs.  Questions were answered to the patient's satisfaction.     Lewayne Bunting

## 2017-04-06 NOTE — H&P (View-Only) (Signed)
HPI [name] is referred today for consideration for biventricular ICD insertion by Dr. Shirlee Latch. The patient is a pleasant 66 year old obese woman who carries a remote history of rheumatic heart disease, and was found to have mitral regurgitation and severe left ventricular dysfunction. It was initially thought that she might have primary valve problem, although after initiation of guideline directed heart failure therapy, and undergoing transesophageal echo, it appears that she has mostly annular dilatation of her mitral valve annulus secondary to chronic systolic heart failure. The patient is frustrated by her inability to lose weight. She denies medical or dietary noncompliance. Her ejection fraction has improved from 20% to 30% with maximal medications. She has left bundle branch block. Her heart failure is currently class III. She has occasional peripheral edema. No syncope. Allergies  Allergen Reactions  . Other Other (See Comments)    Bandaids and some adhesives cause red skin. (pls use paper tape)  . Adhesive [Tape] Itching and Rash  . Morphine And Related Itching     Current Outpatient Prescriptions  Medication Sig Dispense Refill  . acetaminophen (TYLENOL) 650 MG CR tablet Take 1,300 mg by mouth daily as needed for pain.    [name] Kitchen albuterol (PROVENTIL HFA;VENTOLIN HFA) 108 (90 Base) MCG/ACT inhaler Inhale 2 puffs into the lungs every 6 (six) hours as needed for wheezing or shortness of breath.    . allopurinol (ZYLOPRIM) 300 MG tablet Take 300 mg by mouth daily.    [name] Kitchen ALPRAZolam (XANAX) 0.25 MG tablet Take 0.125 mg by mouth daily as needed for anxiety.     [name] Kitchen aspirin EC 81 MG tablet Take 81 mg by mouth every morning.     . cholecalciferol (VITAMIN D) 1000 UNITS tablet Take 1,000 Units by mouth daily.    . colchicine 0.6 MG tablet Take 0.6 mg by mouth daily as needed (FLAREUPS).    . Cyanocobalamin (VITAMIN B-12) 2500 MCG SUBL Place 2,500 mcg under the tongue daily.    . digoxin  (LANOXIN) 0.125 MG tablet Take 1 tablet (0.125 mg total) by mouth daily. 30 tablet 3  . escitalopram (LEXAPRO) 20 MG tablet Take 20 mg by mouth every morning.     . fluticasone (FLONASE) 50 MCG/ACT nasal spray Place 1 spray into both nostrils daily as needed for allergies or rhinitis.    . fluticasone furoate-vilanterol (BREO ELLIPTA) 200-25 MCG/INH AEPB Inhale 1 puff into the lungs daily.    . furosemide (LASIX) 40 MG tablet Take 1 tablet (40 mg total) by mouth daily. 60 tablet 3  . levalbuterol (XOPENEX) 0.63 MG/3ML nebulizer solution Take 0.63 mg by nebulization every 4 (four) hours as needed for wheezing or shortness of breath.    . levocetirizine (XYZAL) 5 MG tablet Take 5 mg by mouth every evening.    . metoprolol succinate (TOPROL-XL) 25 MG 24 hr tablet Take 25 mg (1 tablet) in am and 12.5 mg (1/2 tablet) in pm 45 tablet 6  . omeprazole (PRILOSEC) 20 MG capsule Take 20 mg by mouth daily.    [name] Kitchen Propylene Glycol (SYSTANE BALANCE) 0.6 % SOLN Place 1 drop into both eyes 2 (two) times daily as needed (DRY EYES).     . sacubitril-valsartan (ENTRESTO) 24-26 MG Take 1 tablet by mouth 2 (two) times daily. 180 tablet 3  . simvastatin (ZOCOR) 20 MG tablet Take 20 mg by mouth at bedtime.     [name] Kitchen spironolactone (ALDACTONE) 25 MG tablet Take 25 mg by mouth daily.  No current facility-administered medications for this visit.      Past Medical History:  Diagnosis Date  . Acute CHF (congestive heart failure) (HCC) 11/16/2016   [name] 11/16/2016  . Anxiety    [name] 11/16/2016  . Arthritis    "neck" (11/16/2016)  . Asthma   . Chronic bronchitis (HCC)   . Chronic neck pain   . Depression    [name] 11/16/2016  . Gout   . 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 Heart murmur    "when I was a child"  . High cholesterol   . History of blood transfusion    "w/a back OR"  . Hypertension    [name] 11/16/2016  . Incidental lung nodule, greater than or equal to 8mm 01/02/2017   Probable granuloma right upper  lobe - needs f/u imaging  . Pre-diabetes   . Sleep apnea    "need a mask/tests" (11/16/2016)  . Stroke [facility]) 05/2008   "mini stroke"; denies residual on 11/16/2016  . Thyroid nodule   . TIA (transient ischemic attack) "several"    ROS:   All systems reviewed and negative except as noted in the HPI.   Past Surgical History:  Procedure Laterality Date  . BACK SURGERY    . INNER EAR SURGERY Right 1980s  . LAPAROSCOPIC CHOLECYSTECTOMY  1990s  . NEUROPLASTY / TRANSPOSITION MEDIAN NERVE AT CARPAL TUNNEL Left ~ 2010  . POSTERIOR FUSION LUMBAR SPINE  2005  . RIGHT HEART CATH N/A 03/27/2017   Procedure: RIGHT HEART CATH;  Surgeon: Laurey Morale, MD;  Location: [facility] INVASIVE CV LAB;  Service: Cardiovascular;  Laterality: N/A;  . RIGHT OOPHORECTOMY Right 1990s  . RIGHT/LEFT HEART CATH AND CORONARY ANGIOGRAPHY N/A 11/21/2016   Procedure: Right/Left Heart Cath and Coronary Angiography;  Surgeon: Kathleene Hazel, MD;  Location: [facility] INVASIVE CV LAB;  Service: Cardiovascular;  Laterality: N/A;  . TEAR DUCT PROBING Bilateral 2000s  . TEE WITHOUT CARDIOVERSION N/A 11/22/2016   Procedure: TRANSESOPHAGEAL ECHOCARDIOGRAM (TEE);  Surgeon: Chrystie Nose, MD;  Location: [facility] ENDOSCOPY;  Service: Cardiovascular;  Laterality: N/A;  . TEE WITHOUT CARDIOVERSION N/A 03/29/2017   Procedure: TRANSESOPHAGEAL ECHOCARDIOGRAM (TEE);  Surgeon: Laurey Morale, MD;  Location: [facility] ENDOSCOPY;  Service: Cardiovascular;  Laterality: N/A;  . VAGINAL HYSTERECTOMY  1989     Family History  Problem Relation Age of Onset  . Hypertension Mother      Social History   Social History  . Marital status: Married    Spouse name: N/A  . Number of children: N/A  . Years of education: N/A   Occupational History  . Not on file.   Social History Main Topics  . Smoking status: Former Smoker    Packs/day: 0.75    Years: 13.00    Types: Cigarettes    Quit date: 1989  . Smokeless tobacco: Never Used  . Alcohol use Yes       Comment: 11/16/2016 "might have 3-4 drinks/month"  . Drug use: No     Comment: 11/16/2016 "I smoked marijuana in college"  . Sexual activity: Not on file   Other Topics Concern  . Not on file   Social History Narrative  . No narrative on file     Pulse 69   LMP  (LMP Unknown)  Blood pressure 122/72, respirations 18, weight 261 Physical Exam:  Well appearing obese 66 year old woman, NAD HEENT: Unremarkable Neck:  8 cm JVD, no thyromegally Lymphatics:  No adenopathy Back:  No  CVA tenderness Lungs:  Clear with no wheezes, rales, or rhonchi HEART:  Regular rate rhythm, no murmurs, no rubs, no clicks, split S2 Abd:  soft, positive bowel sounds, no organomegally, no rebound, no guarding Ext:  2 plus pulses, trace peripheral edema, no cyanosis, no clubbing Skin:  No rashes no nodules Neuro:  CN II through XII intact, motor grossly intact  EKG - sinus rhythm with left bundle branch block, QRS duration 156 ms   Assess/Plan: 1. Chronic systolic heart failure - the patient is on maximal medical therapy, and has persistent class III symptoms. She has left bundle branch block. I discussed the risk, benefits, goals, and expectations of biventricular ICD insertion with the patient, and she wishes to proceed. 2. Left bundle branch block - she has not had syncope. Hopefully her QRS were narrow with resynchronization therapy 3. Mitral regurgitation - she appears to have mitral annular regurgitation. Hopefully this will improve with successful resynchronization. 4. Obesity - we discussed the importance of weight loss. I suspect this will be an ongoing problem with the patient. 5. Palpitations - the patient today mentions that she feels like her heart is beating irregular for brief periods. I have recommended that she undergo watchful waiting. Once her device is placed, we will have a better idea of whether or not she is having atrial, ventricular, or other types of ectopy. She could be having  paroxysmal atrial fibrillation although this is not been documented.  Lewayne Bunting, M.D.

## 2017-04-06 NOTE — Discharge Summary (Signed)
ELECTROPHYSIOLOGY PROCEDURE DISCHARGE SUMMARY    Patient ID: Margaret Vargas,  MRN: [id number], DOB/AGE: 1951/03/07 66 y.o.  Admit date: 04/06/2017 Discharge date: 04/07/2017  Primary Care Physician: Mila Palmer, MD Primary Cardiologist: Shirlee Latch Electrophysiologist: Ladona Ridgel  Primary Discharge Diagnosis:  NICM, CHF s/p CRTD implant this admission  Secondary Discharge Diagnosis:  1.  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  OSA 3.  Anxiety/depression  Allergies  Allergen Reactions  . Other Other (See Comments)    Bandaids and some adhesives cause red skin. (pls use paper tape)  . Adhesive [Tape] Itching and Rash  . Morphine And Related Itching     Procedures This Admission:  1.  Implantation of a BSX CRTD on 04/06/17 by Dr Ladona Ridgel. See op note for full details. There were no immediate post procedure complications. 2.  CXR on 04/07/17 demonstrated no pneumothorax status post device implantation.   Brief HPI: Margaret Vargas is a 66 y.o. female was referred to electrophysiology in the outpatient setting for consideration of ICD implantation.  Past medical history includes moderate MR, NICM, CHF, LBBB.  The patient has persistent LV dysfunction despite guideline directed therapy.  Risks, benefits, and alternatives to ICD implantation were reviewed with the patient who wished to proceed.   Hospital Course:  The patient was admitted and underwent implantation of a BSX CRTD with details as outlined above. She was monitored on telemetry overnight which demonstrated sinus rhythm with V pacing.  Left chest was without hematoma or ecchymosis.  The device was interrogated and found to be functioning normally.  CXR was obtained and demonstrated no pneumothorax status post device implantation.  Wound care, arm mobility, and restrictions were reviewed with the patient.  The patient was examined and considered stable for discharge to home.   The patient's discharge medications include an ARB  (Entresto) and beta blocker (Metoprolol).   Physical Exam: Vitals:   04/06/17 2056 04/06/17 2212 04/07/17 0425 04/07/17 0428  BP: (!) 118/51 106/64  134/70  Pulse: 65 64  62  Resp: 16   16  Temp: 98 F (36.7 C)   97.7 F (36.5 C)  TempSrc: Oral   Oral  SpO2: 97% 98%  98%  Weight:   265 lb 14.4 oz (120.6 kg)   Height:        GEN- The patient is well appearing, alert and oriented x 3 today.   HEENT: normocephalic, atraumatic; sclera clear, conjunctiva pink; hearing intact; oropharynx clear; neck supple Lungs- Clear to ausculation bilaterally, normal work of breathing.  No wheezes, rales, rhonchi Heart- Regular rate and rhythm (paced) GI- soft, non-tender, non-distended, bowel sounds present  Extremities- no clubbing, cyanosis, or edema  MS- no significant deformity or atrophy Skin- warm and dry, no rash or lesion, left chest without hematoma/ecchymosis Psych- euthymic mood, full affect Neuro- strength and sensation are intact   Labs:   Lab Results  Component Value Date   WBC 6.1 04/04/2017   HGB 14.1 04/04/2017   HCT 41.4 04/04/2017   MCV 95 04/04/2017   PLT 320 04/04/2017     Recent Labs Lab 04/04/17 1234  NA 144  K 4.2  CL 104  CO2 22  BUN 13  CREATININE 0.75  CALCIUM 9.9  GLUCOSE 104*    Discharge Medications:  Allergies as of 04/07/2017      Reactions   Other Other (See Comments)   Bandaids and some adhesives cause red skin. (pls use paper tape)   Adhesive [tape] Itching, Rash   Morphine And  Related Itching      Medication List    TAKE these medications   acetaminophen 650 MG CR tablet Commonly known as:  TYLENOL Take 1,300 mg by mouth daily as needed for pain.   albuterol 108 (90 Base) MCG/ACT inhaler Commonly known as:  PROVENTIL HFA;VENTOLIN HFA Inhale 2 puffs into the lungs every 6 (six) hours as needed for wheezing or shortness of breath.   allopurinol 300 MG tablet Commonly known as:  ZYLOPRIM Take 300 mg by mouth daily.   ALPRAZolam  0.25 MG tablet Commonly known as:  XANAX Take 0.125 mg by mouth daily as needed for anxiety.   aspirin EC 81 MG tablet Take 81 mg by mouth every morning.   BREO ELLIPTA 200-25 MCG/INH Aepb Generic drug:  fluticasone furoate-vilanterol Inhale 1 puff into the lungs daily.   cholecalciferol 1000 units tablet Commonly known as:  VITAMIN D Take 1,000 Units by mouth daily.   colchicine 0.6 MG tablet Take 0.6 mg by mouth daily as needed (FLAREUPS).   digoxin 0.125 MG tablet Commonly known as:  LANOXIN TAKE 1 TABLET (0.125 MG TOTAL) BY MOUTH DAILY.   escitalopram 20 MG tablet Commonly known as:  LEXAPRO Take 20 mg by mouth every morning.   fluticasone 50 MCG/ACT nasal spray Commonly known as:  FLONASE Place 1 spray into both nostrils daily as needed for allergies or rhinitis.   furosemide 40 MG tablet Commonly known as:  LASIX Take 1 tablet (40 mg total) by mouth daily.   HYDROcodone-acetaminophen 5-325 MG tablet Commonly known as:  NORCO/VICODIN Take 1 tablet by mouth every 4 (four) hours as needed for moderate pain or severe pain.   levalbuterol 0.63 MG/3ML nebulizer solution Commonly known as:  XOPENEX Take 0.63 mg by nebulization every 4 (four) hours as needed for wheezing or shortness of breath.   levocetirizine 5 MG tablet Commonly known as:  XYZAL Take 5 mg by mouth every evening.   metoprolol succinate 25 MG 24 hr tablet Commonly known as:  TOPROL-XL Take 25 mg (1 tablet) in am and 12.5 mg (1/2 tablet) in pm   omeprazole 20 MG capsule Commonly known as:  PRILOSEC Take 20 mg by mouth daily.   sacubitril-valsartan 24-26 MG Commonly known as:  ENTRESTO Take 1 tablet by mouth 2 (two) times daily.   simvastatin 20 MG tablet Commonly known as:  ZOCOR Take 20 mg by mouth at bedtime.   spironolactone 25 MG tablet Commonly known as:  ALDACTONE Take 25 mg by mouth daily.   SYSTANE BALANCE 0.6 % Soln Generic drug:  Propylene Glycol Place 1 drop into both eyes  2 (two) times daily as needed (DRY EYES).   Vitamin B-12 2500 MCG Subl Place 2,500 mcg under the tongue daily.            Discharge Care Instructions        Start     Ordered   04/07/17 0000  HYDROcodone-acetaminophen (NORCO/VICODIN) 5-325 MG tablet  Every 4 hours PRN    Question:  Supervising Provider  Answer:  Marinus Maw   04/07/17 0640   04/07/17 0000  Increase activity slowly     04/07/17 0640   04/07/17 0000  Diet - low sodium heart healthy     04/07/17 0640      Disposition:  Discharge Instructions    Diet - low sodium heart healthy    Complete by:  As directed    Increase activity slowly    Complete by:  As directed      Follow-up Information    [facility] Office Follow up on 04/20/2017.   Specialty:  Cardiology Why:  at [facility] for wound check  Contact information: [address] [place] [address] [phone]       Marinus Maw, MD Follow up on 07/10/2017.   Specialty:  Cardiology Why:  at 9:30AM Contact information: [address] [place] [address] [phone]           Duration of Discharge Encounter: Greater than 30 minutes including physician time.  Signed, Gypsy Balsam, NP 04/07/2017 6:40 AM  EP Attending  Patient seen and examined. Agree with above. The patient is doing well after BiV ICD insertion. Her device interogation under my supervision is acceptable. CXR is ok. She will be discharged home with usual followup.  Leonia Reeves.D.

## 2017-04-06 NOTE — Discharge Instructions (Signed)
° ° °  Supplemental Discharge Instructions for  Pacemaker/Defibrillator Patients  Activity No heavy lifting or vigorous activity with your left/right arm for 6 to 8 weeks.  Do not raise your left/right arm above your head for one week.  Gradually raise your affected arm as drawn below.           __       04/10/17                           04/11/17                  04/12/17                 04/13/17  NO DRIVING for  1 week   ; you may begin driving on   6/00/45  .  WOUND CARE - Keep the wound area clean and dry.  Do not get this area wet for one week. No showers for one week; you may shower on  04/13/17   . - The tape/steri-strips on your wound will fall off; do not pull them off.  No bandage is needed on the site.  DO  NOT apply any creams, oils, or ointments to the wound area. - If you notice any drainage or discharge from the wound, any swelling or bruising at the site, or you develop a fever > 101? F after you are discharged home, call the office at once.  Special Instructions - You are still able to use cellular telephones; use the ear opposite the side where you have your pacemaker/defibrillator.  Avoid carrying your cellular phone near your device. - When traveling through airports, show security personnel your identification card to avoid being screened in the metal detectors.  Ask the security personnel to use the hand wand. - Avoid arc welding equipment, MRI testing (magnetic resonance imaging), TENS units (transcutaneous nerve stimulators).  Call the office for questions about other devices. - Avoid electrical appliances that are in poor condition or are not properly grounded. - Microwave ovens are safe to be near or to operate.  Additional information for defibrillator patients should your device go off: - If your device goes off ONCE and you feel fine afterward, notify the device clinic nurses. - If your device goes off ONCE and you do not feel well afterward, call 911. - If your  device goes off TWICE, call 911. - If your device goes off THREE times in one day, call 911.  DO NOT DRIVE YOURSELF OR A FAMILY MEMBER WITH A DEFIBRILLATOR TO THE HOSPITAL--CALL 911.

## 2017-04-07 ENCOUNTER — Ambulatory Visit (HOSPITAL_COMMUNITY): Payer: Medicare Other

## 2017-04-07 ENCOUNTER — Other Ambulatory Visit (HOSPITAL_COMMUNITY): Payer: Self-pay | Admitting: Cardiology

## 2017-04-07 DIAGNOSIS — I447 Left bundle-branch block, unspecified: Secondary | ICD-10-CM | POA: Diagnosis not present

## 2017-04-07 DIAGNOSIS — Z006 Encounter for examination for normal comparison and control in clinical research program: Secondary | ICD-10-CM | POA: Diagnosis not present

## 2017-04-07 DIAGNOSIS — I429 Cardiomyopathy, unspecified: Secondary | ICD-10-CM | POA: Diagnosis not present

## 2017-04-07 DIAGNOSIS — I110 Hypertensive heart disease with heart failure: Secondary | ICD-10-CM | POA: Diagnosis not present

## 2017-04-07 MED ORDER — HYDROCODONE-ACETAMINOPHEN 5-325 MG PO TABS: 1.0000 | ORAL_TABLET | ORAL | 0 refills | Status: DC | PRN

## 2017-04-07 MED ORDER — SACUBITRIL-VALSARTAN 24-26 MG PO TABS: 1.0000 | ORAL_TABLET | Freq: Two times a day (BID) | ORAL | 3 refills | Status: DC

## 2017-04-10 ENCOUNTER — Encounter: Payer: BLUE CROSS/BLUE SHIELD | Admitting: Thoracic Surgery (Cardiothoracic Vascular Surgery)

## 2017-04-10 ENCOUNTER — Encounter: Payer: Medicare Other | Admitting: Thoracic Surgery (Cardiothoracic Vascular Surgery)

## 2017-04-10 ENCOUNTER — Other Ambulatory Visit: Payer: BLUE CROSS/BLUE SHIELD

## 2017-04-12 ENCOUNTER — Telehealth: Payer: Self-pay | Admitting: *Deleted

## 2017-04-12 NOTE — Telephone Encounter (Signed)
Patient called in stating that she had been ShOB x 30 mins. Patient needed help sending transmission. Verbal instructions provided.  Remote transmission received. Presenting rhythm: AsLVp. No atrial or ventricular arrhythmias recorded. Stable device function.   Called patient back with device results. Informed patient that everything appeared stable. Patient admitted that she may have had an anxiety attack bc she didn't know she would be able to feel the device. I explained to patient that the device is implanted right under the skin, so she will feel it, but hopefully she will feel more at ease with it being there over time. Patient verbalized understanding.

## 2017-04-15 ENCOUNTER — Other Ambulatory Visit (HOSPITAL_COMMUNITY): Payer: Self-pay | Admitting: Cardiology

## 2017-04-18 ENCOUNTER — Encounter (HOSPITAL_COMMUNITY): Payer: Self-pay | Admitting: Cardiology

## 2017-04-18 ENCOUNTER — Ambulatory Visit (HOSPITAL_COMMUNITY)
Admission: RE | Admit: 2017-04-18 | Discharge: 2017-04-18 | Disposition: A | Discharge status: Home / Self Care | Payer: Medicare Other | Source: Ambulatory Visit | Attending: Cardiology | Admitting: Cardiology

## 2017-04-18 VITALS — BP 130/67 | HR 61 | Wt 265.0 lb

## 2017-04-18 DIAGNOSIS — I447 Left bundle-branch block, unspecified: Secondary | ICD-10-CM | POA: Insufficient documentation

## 2017-04-18 DIAGNOSIS — G4733 Obstructive sleep apnea (adult) (pediatric): Secondary | ICD-10-CM | POA: Insufficient documentation

## 2017-04-18 DIAGNOSIS — R002 Palpitations: Secondary | ICD-10-CM | POA: Diagnosis not present

## 2017-04-18 DIAGNOSIS — I5022 Chronic systolic (congestive) heart failure: Secondary | ICD-10-CM | POA: Diagnosis not present

## 2017-04-18 DIAGNOSIS — I428 Other cardiomyopathies: Secondary | ICD-10-CM | POA: Insufficient documentation

## 2017-04-18 DIAGNOSIS — Z79899 Other long term (current) drug therapy: Secondary | ICD-10-CM | POA: Diagnosis not present

## 2017-04-18 DIAGNOSIS — F419 Anxiety disorder, unspecified: Secondary | ICD-10-CM | POA: Insufficient documentation

## 2017-04-18 DIAGNOSIS — F329 Major depressive disorder, single episode, unspecified: Secondary | ICD-10-CM | POA: Diagnosis not present

## 2017-04-18 DIAGNOSIS — I099 Rheumatic heart disease, unspecified: Secondary | ICD-10-CM | POA: Insufficient documentation

## 2017-04-18 DIAGNOSIS — I340 Nonrheumatic mitral (valve) insufficiency: Secondary | ICD-10-CM | POA: Diagnosis not present

## 2017-04-18 DIAGNOSIS — R911 Solitary pulmonary nodule: Secondary | ICD-10-CM | POA: Diagnosis not present

## 2017-04-18 DIAGNOSIS — Z7982 Long term (current) use of aspirin: Secondary | ICD-10-CM | POA: Diagnosis not present

## 2017-04-18 LAB — BASIC METABOLIC PANEL
Anion gap: 9 (ref 5–15)
BUN: 13 mg/dL (ref 6–20)
CHLORIDE: 109 mmol/L (ref 101–111)
CO2: 22 mmol/L (ref 22–32)
CREATININE: 0.83 mg/dL (ref 0.44–1.00)
Calcium: 9.3 mg/dL (ref 8.9–10.3)
GFR calc non Af Amer: >60 mL/min (ref >60)
Glucose, Bld: 92 mg/dL (ref 65–99)
Potassium: 3.8 mmol/L (ref 3.5–5.1)
Sodium: 140 mmol/L (ref 135–145)

## 2017-04-18 LAB — DIGOXIN LEVEL: Digoxin Level: 0.4 ng/mL — ABNORMAL LOW (ref 0.8–2.0)

## 2017-04-18 MED ORDER — SIMVASTATIN 20 MG PO TABS: 20.0000 mg | ORAL_TABLET | Freq: Every day | ORAL | 3 refills | Status: DC

## 2017-04-18 MED ORDER — METOPROLOL SUCCINATE ER 25 MG PO TB24: 25.0000 mg | ORAL_TABLET | Freq: Two times a day (BID) | ORAL | 6 refills | Status: DC

## 2017-04-18 NOTE — Progress Notes (Signed)
PCP: Dr. Paulino Rily HF Cardiology: Dr. Shirlee Latch  66 yo with history of suspected rheumatic fever, nonischemic cardiomyopathy, and severe mitral regurgitation presents for CHF followup.  Patient had an episode in childhood that was thought to be rheumatic fever.  She had a murmur as a child, but never had an echocardiogram.  Starting last summer, she developed exertional dyspnea. This has been progressive since that time.  She never saw a cardiologist until she was admitted in 4/18 with worsening dyspnea and weight gain.  Weight increased about 35 lbs from last summer.   She was diuresed in the hospital and had an echo and a TEE, showing EF 25-30% with rheumatic-appearing mitral valve and severe mitral regurgitation.  She has been seen by Dr Barry Dienes for evaluation for surgical repair/replacement of the MV.  Given improvement in symptoms, he suggested repeating TEE eventually to reassess.  Therefore, she had repeat RHC and TEE in 8/18 after medical therapy.  EF was 30-35% with septal-lateral dyssynchrony and there was only moderate functional MR.  RHC showed low filling pressures.  She had [employer] CRT-D placed in 8/18.   She is doing well.  Feels good overall.  No significant exertional dyspnea.  Able to dance at husband's 70[age] birthday.  Some lightheadedness when she stands.  No chest pain.  No orthopnea/PND.  Weight up about 3 lbs but has not been eating well.      Labs (4/18): BNP 698, K 4.3, creatinine 1.13 => 0.94, BNP 599 Labs (5/18): BNP 206, K 4.2, creatinine 0.87 Labs (7/18): digoxin 0.4, K 3.7, creatinine 0.98 Labs (8/18): K 4.2, creatinine 0.75, hgb 14.1  PMH: 1. OSA 2. LBBB 3. h/o C difficile 4. Rheumatic fever as a child with possible rheumatic heart disease and severe MR.  - Echo (4/18): EF 25-30%, mild RV dilation with mildly decreased RV systolic function, moderate TR, thickened MV leaflets with 2 MR jets (2 defined areas of malcoaptation), severe MR.   - TEE (4/18): EF 25-30%,  mild LV dilation, confirms severe MR with the above features.  - TEE (8/18): EF 30-35%, septal-lateral dyssynchrony, moderate MR (likely functional).  5. Chronic systolic CHF: Nonischemic, prior myocarditis + valvular, versus all valvular.  - RHC/LHC (4/18): No significant CAD, severe MR.  Mean RA 18, PA 61/29, mean PCWP 30, CI 1.89.  - EF 30-35% on TEE 8/18.  - RHC (8/18): mean RA 1, PA 32/8, mean PCWP 6, CI 1.83 - [employer] CRT-D placed 8/18.  6. Anxiety/depression 7. Lung nodule by CT   SH: Married, lives in [place], retired Air[employer] pilot, daughter lives in [place], nonsmoker, no ETOH.   FH: Mother with sudden cardiac death at 24.   ROS: All systems reviewed and negative except as per HPI.   Current Outpatient Prescriptions  Medication Sig Dispense Refill  . acetaminophen (TYLENOL) 650 MG CR tablet Take 1,300 mg by mouth daily as needed for pain.    [name] Kitchen albuterol (PROVENTIL HFA;VENTOLIN HFA) 108 (90 Base) MCG/ACT inhaler Inhale 2 puffs into the lungs every 6 (six) hours as needed for wheezing or shortness of breath.    . allopurinol (ZYLOPRIM) 300 MG tablet Take 300 mg by mouth daily.    [name] Kitchen ALPRAZolam (XANAX) 0.25 MG tablet Take 0.125 mg by mouth daily as needed for anxiety.     [name] Kitchen aspirin EC 81 MG tablet Take 81 mg by mouth every morning.     . cholecalciferol (VITAMIN D) 1000 UNITS tablet Take 1,000 Units by mouth daily.    [name] Kitchen  colchicine 0.6 MG tablet Take 0.6 mg by mouth daily as needed (FLAREUPS).    . Cyanocobalamin (VITAMIN B-12) 2500 MCG SUBL Place 2,500 mcg under the tongue daily.    . digoxin (LANOXIN) 0.125 MG tablet TAKE 1 TABLET (0.125 MG TOTAL) BY MOUTH DAILY. 30 tablet 3  . escitalopram (LEXAPRO) 20 MG tablet Take 20 mg by mouth every morning.     . fluticasone (FLONASE) 50 MCG/ACT nasal spray Place 1 spray into both nostrils daily as needed for allergies or rhinitis.    . fluticasone furoate-vilanterol (BREO ELLIPTA) 200-25 MCG/INH AEPB Inhale 1 puff into the lungs  daily.    . furosemide (LASIX) 40 MG tablet Take 1 tablet (40 mg total) by mouth daily. 60 tablet 3  . HYDROcodone-acetaminophen (NORCO/VICODIN) 5-325 MG tablet Take 1 tablet by mouth every 4 (four) hours as needed for moderate pain or severe pain. 10 tablet 0  . levalbuterol (XOPENEX) 0.63 MG/3ML nebulizer solution Take 0.63 mg by nebulization every 4 (four) hours as needed for wheezing or shortness of breath.    . levocetirizine (XYZAL) 5 MG tablet Take 5 mg by mouth every evening.    . metoprolol succinate (TOPROL-XL) 25 MG 24 hr tablet Take 1 tablet (25 mg total) by mouth 2 (two) times daily. 60 tablet 6  . omeprazole (PRILOSEC) 20 MG capsule Take 20 mg by mouth daily.    [name] Kitchen Propylene Glycol (SYSTANE BALANCE) 0.6 % SOLN Place 1 drop into both eyes 2 (two) times daily as needed (DRY EYES).     . sacubitril-valsartan (ENTRESTO) 24-26 MG Take 1 tablet by mouth 2 (two) times daily. 180 tablet 3  . simvastatin (ZOCOR) 20 MG tablet Take 1 tablet (20 mg total) by mouth at bedtime. 30 tablet 3  . spironolactone (ALDACTONE) 25 MG tablet Take 25 mg by mouth daily.     No current facility-administered medications for this encounter.    BP 130/67   Pulse 61   Wt 265 lb (120.2 kg)   LMP  (LMP Unknown)   SpO2 99%   BMI 38.57 kg/m  General: NAD Neck: No JVD, no thyromegaly or thyroid nodule.  Lungs: Clear to auscultation bilaterally with normal respiratory effort. CV: Nondisplaced PMI.  Heart regular S1/S2, no S3/S4, 1/6 HSM apex.  No peripheral edema.  No carotid bruit.  Normal pedal pulses.  Abdomen: Soft, nontender, no hepatosplenomegaly, no distention.  Skin: Intact without lesions or rashes.  Neurologic: Alert and oriented x 3.  Psych: Normal affect. Extremities: No clubbing or cyanosis.  HEENT: Normal.   Assessment/Plan: 1. Chronic systolic CHF: EF 53-66% on echo in 4/18, nonischemic cardiomyopathy.  No prior echo.  She has severe mitral regurgitation and a rheumatic-appearing mitral  valve.  It is hard to know which was the initial problem (cardiomyopathy or mitral regurgitation), and hard to know how related they are.  Cannot rule out valvular cardiomyopathy due to long-term severe mitral regurgitation (insidious onset of symptoms almost a year ago, and had never had an echo).  Alternatively, she could have a nonischemic cardiomyopathy from some other etiology like myocarditis and have a co-existing rheumatic mitral valve with perhaps worsening of MR from annular dilatation (secondary MR). Followup TEE in 8/18 showed EF 30-35% with dyssynchrony but MR only moderate.  RHC showed low filling pressures.  Now s/p [employer] CRT-D.  Not volume overloaded on exam.  Now NYHA class II.  Medication titration has been limited somewhat by orthostatic symptoms though her resting BP is 130/67  today.  - Increase Toprol XL to 25 mg bid.     - Continue Lasix 40 mg daily.  BMET today.  - Continue Entresto and spironolactone at current doses.   - Continue digoxin, check level today.  - Can add Bidil in the future if orthostatic symptoms resolve.  - Echo in 6 months to assess effect of CRT.  2. Rheumatic heart disease: Severe MR on 4/18 TEE with a rheumatic-appearing valve, also moderate TR.  There are 2 jets of MR from 2 defined areas of malcoaptation.  As above, do not know if there is a co-existing nonischemic cardiomyopathy or if long-standing severe MR led to a cardiomyopathy.  Mitral regurgitation is likely mixed => rheumatic mitral valve disease but also concern for a component of secondary MR from annular dilatation.  Repeat TEE in 8/18 showed moderate MR, suspect there was a significant functional component.  No surgery at this point.  May improve further with CRT.  3. LBBB: s/p CRT device placement.  4. Lung nodule: Repeat CT ordered through Dr Orvan July office, needs to be in 11/18 (6 months).   5. Palpitations: Monitor via device for afib.    Followup in 2 months.   Marca Ancona 04/18/2017

## 2017-04-18 NOTE — Patient Instructions (Signed)
Increase Metoprolol to 25 mg (1 tab), 2 times daily   Your physician recommends that you schedule a follow-up appointment in: 2 months

## 2017-04-19 MED ORDER — METOPROLOL SUCCINATE ER 25 MG PO TB24: 25.0000 mg | ORAL_TABLET | Freq: Two times a day (BID) | ORAL | 6 refills | Status: DC

## 2017-04-19 NOTE — Addendum Note (Signed)
Encounter addended by: Teresa Coombs, RN on: 04/19/2017  9:22 AM<BR>    Actions taken: Order list changed

## 2017-04-20 ENCOUNTER — Ambulatory Visit (INDEPENDENT_AMBULATORY_CARE_PROVIDER_SITE_OTHER): Payer: Medicare Other | Admitting: *Deleted

## 2017-04-20 DIAGNOSIS — I428 Other cardiomyopathies: Secondary | ICD-10-CM

## 2017-04-20 LAB — CUP PACEART INCLINIC DEVICE CHECK
HighPow Impedance: 64 Ohm
HighPow Impedance: 71 Ohm
Implantable Lead Implant Date: 20180823
Implantable Lead Implant Date: 20180823
Implantable Lead Model: 7741
Implantable Lead Serial Number: 435016
Implantable Lead Serial Number: 872654
Lead Channel Impedance Value: 417 Ohm
Lead Channel Impedance Value: 599 Ohm
Lead Channel Pacing Threshold Amplitude: 1.6 V
Lead Channel Pacing Threshold Pulse Width: 0.8 ms
Lead Channel Sensing Intrinsic Amplitude: 16.1 mV
Lead Channel Sensing Intrinsic Amplitude: 25 mV
Lead Channel Sensing Intrinsic Amplitude: 7.4 mV
Lead Channel Setting Pacing Amplitude: 3.5 V
Lead Channel Setting Pacing Amplitude: 3.5 V
Lead Channel Setting Pacing Amplitude: 3.5 V
Lead Channel Setting Pacing Pulse Width: 0.4 ms
Lead Channel Setting Sensing Sensitivity: 1 mV
MDC IDC LEAD IMPLANT DT: 20180823
MDC IDC LEAD LOCATION: 753858
MDC IDC LEAD LOCATION: 753859
MDC IDC LEAD LOCATION: 753860
MDC IDC LEAD SERIAL: 609786
MDC IDC MSMT LEADCHNL LV IMPEDANCE VALUE: 614 Ohm
MDC IDC MSMT LEADCHNL RA PACING THRESHOLD AMPLITUDE: 0.6 V
MDC IDC MSMT LEADCHNL RA PACING THRESHOLD PULSEWIDTH: 0.4 ms
MDC IDC MSMT LEADCHNL RV PACING THRESHOLD AMPLITUDE: 0.9 V
MDC IDC MSMT LEADCHNL RV PACING THRESHOLD PULSEWIDTH: 0.4 ms
MDC IDC PG IMPLANT DT: 20180823
MDC IDC PG SERIAL: 186593
MDC IDC SESS DTM: 20180906040000
MDC IDC SET LEADCHNL LV PACING PULSEWIDTH: 0.8 ms
MDC IDC SET LEADCHNL RV SENSING SENSITIVITY: 0.5 mV

## 2017-04-20 NOTE — Progress Notes (Signed)
Wound check appointment. Steri-strips removed. Wound without redness or edema. Incision edges approximated, wound well healed. Normal device function. Thresholds, sensing, and impedances consistent with implant measurements. Device programmed at 3.5V for extra safety margin until 3 month visit. Histogram distribution appropriate for patient and level of activity. 1 ATR episode-- EGM appears 1:1, Avg V rate 159 bpm, < 1 minute. No ventricular arrhythmias noted. Patient educated about wound care, arm mobility, lifting restrictions, shock plan. ROV with GT 07/10/17.

## 2017-05-08 ENCOUNTER — Other Ambulatory Visit (HOSPITAL_COMMUNITY): Payer: Self-pay | Admitting: Cardiology

## 2017-05-08 ENCOUNTER — Ambulatory Visit (INDEPENDENT_AMBULATORY_CARE_PROVIDER_SITE_OTHER): Payer: Medicare Other | Admitting: *Deleted

## 2017-05-08 DIAGNOSIS — I428 Other cardiomyopathies: Secondary | ICD-10-CM

## 2017-05-08 NOTE — Progress Notes (Signed)
Patient seen today in clinic for stitch present on left lateral incision. Stitch clipped and steri strips applied. Wound re-check on 10/1.

## 2017-05-15 ENCOUNTER — Ambulatory Visit (INDEPENDENT_AMBULATORY_CARE_PROVIDER_SITE_OTHER): Payer: Medicare Other | Admitting: *Deleted

## 2017-05-15 DIAGNOSIS — I428 Other cardiomyopathies: Secondary | ICD-10-CM

## 2017-05-15 NOTE — Progress Notes (Signed)
Wound re-check. Steri-strips removed prior to appointment. Stitch present medial incision. No redness, edema or drainage present. Stitch removed, antibiotic ointment and bandaid applied. Patient education about wound care and signs and symptoms of infection. ROV with GT 07/10/17.

## 2017-06-26 ENCOUNTER — Encounter (HOSPITAL_COMMUNITY): Payer: Self-pay | Admitting: Cardiology

## 2017-06-26 ENCOUNTER — Ambulatory Visit (HOSPITAL_COMMUNITY)
Admission: RE | Admit: 2017-06-26 | Discharge: 2017-06-26 | Disposition: A | Discharge status: Home / Self Care | Payer: Medicare Other | Source: Ambulatory Visit | Attending: Cardiology | Admitting: Cardiology

## 2017-06-26 VITALS — BP 137/70 | HR 63 | Wt 271.8 lb

## 2017-06-26 DIAGNOSIS — Z8241 Family history of sudden cardiac death: Secondary | ICD-10-CM | POA: Diagnosis not present

## 2017-06-26 DIAGNOSIS — I340 Nonrheumatic mitral (valve) insufficiency: Secondary | ICD-10-CM | POA: Diagnosis not present

## 2017-06-26 DIAGNOSIS — R002 Palpitations: Secondary | ICD-10-CM | POA: Insufficient documentation

## 2017-06-26 DIAGNOSIS — I447 Left bundle-branch block, unspecified: Secondary | ICD-10-CM | POA: Diagnosis not present

## 2017-06-26 DIAGNOSIS — I429 Cardiomyopathy, unspecified: Secondary | ICD-10-CM | POA: Diagnosis not present

## 2017-06-26 DIAGNOSIS — I051 Rheumatic mitral insufficiency: Secondary | ICD-10-CM | POA: Diagnosis not present

## 2017-06-26 DIAGNOSIS — Z9889 Other specified postprocedural states: Secondary | ICD-10-CM | POA: Insufficient documentation

## 2017-06-26 DIAGNOSIS — F329 Major depressive disorder, single episode, unspecified: Secondary | ICD-10-CM | POA: Insufficient documentation

## 2017-06-26 DIAGNOSIS — F419 Anxiety disorder, unspecified: Secondary | ICD-10-CM | POA: Diagnosis not present

## 2017-06-26 DIAGNOSIS — I099 Rheumatic heart disease, unspecified: Secondary | ICD-10-CM | POA: Diagnosis not present

## 2017-06-26 DIAGNOSIS — Z7982 Long term (current) use of aspirin: Secondary | ICD-10-CM | POA: Diagnosis not present

## 2017-06-26 DIAGNOSIS — Z79899 Other long term (current) drug therapy: Secondary | ICD-10-CM | POA: Diagnosis not present

## 2017-06-26 DIAGNOSIS — R911 Solitary pulmonary nodule: Secondary | ICD-10-CM | POA: Diagnosis not present

## 2017-06-26 DIAGNOSIS — I4891 Unspecified atrial fibrillation: Secondary | ICD-10-CM | POA: Diagnosis not present

## 2017-06-26 DIAGNOSIS — I5022 Chronic systolic (congestive) heart failure: Secondary | ICD-10-CM | POA: Insufficient documentation

## 2017-06-26 DIAGNOSIS — Z79891 Long term (current) use of opiate analgesic: Secondary | ICD-10-CM | POA: Insufficient documentation

## 2017-06-26 DIAGNOSIS — G4733 Obstructive sleep apnea (adult) (pediatric): Secondary | ICD-10-CM | POA: Insufficient documentation

## 2017-06-26 LAB — BASIC METABOLIC PANEL
Anion gap: 7 (ref 5–15)
BUN: 15 mg/dL (ref 6–20)
CALCIUM: 9.6 mg/dL (ref 8.9–10.3)
CO2: 28 mmol/L (ref 22–32)
Chloride: 104 mmol/L (ref 101–111)
Creatinine, Ser: 0.86 mg/dL (ref 0.44–1.00)
GFR calc Af Amer: >60 mL/min (ref >60)
GFR calc non Af Amer: >60 mL/min (ref >60)
GLUCOSE: 112 mg/dL — AB (ref 65–99)
POTASSIUM: 3.9 mmol/L (ref 3.5–5.1)
SODIUM: 139 mmol/L (ref 135–145)

## 2017-06-26 LAB — DIGOXIN LEVEL: Digoxin Level: 0.3 ng/mL — ABNORMAL LOW (ref 0.8–2.0)

## 2017-06-26 MED ORDER — METOPROLOL SUCCINATE ER 25 MG PO TB24: ORAL_TABLET | ORAL | 6 refills | Status: DC

## 2017-06-26 NOTE — Patient Instructions (Signed)
Take TOPROL XL 25mg  (1 tablet) in the morning and 50mg  (2 tablets) in the evening.  Routine lab work today. Will notify you of abnormal results  Follow up and echo with Dr.McLean in 3 months

## 2017-06-26 NOTE — Progress Notes (Signed)
PCP: Dr. Paulino RilyWolters HF Cardiology: Dr. Shirlee LatchMcLean  66 yo with history of suspected rheumatic fever, nonischemic cardiomyopathy, and severe mitral regurgitation presents for followup of CHF and mitral regurgitation.  Patient had an episode in childhood that was thought to be rheumatic fever.  She had a murmur as a child, but never had an echocardiogram.  Starting last summer, she developed exertional dyspnea. This has been progressive since that time.  She never saw a cardiologist until she was admitted in 4/18 with worsening dyspnea and weight gain.  Weight increased about 35 lbs from last summer.   She was diuresed in the hospital and had an echo and a TEE, showing EF 25-30% with rheumatic-appearing mitral valve and severe mitral regurgitation.  She has been seen by Dr Barry Dieneswens for evaluation for surgical repair/replacement of the MV.  Given improvement in symptoms, he suggested repeating TEE eventually to reassess.  Therefore, she had repeat RHC and TEE in 8/18 after medical therapy.  EF was 30-35% with septal-lateral dyssynchrony and there was only moderate functional MR.  RHC showed low filling pressures.  She had [employer] CRT-D placed in 8/18.   She continues to feel well.  Exercising more, now walking 3.5 miles 4 times/week.  No dyspnea walking on flat ground or up gentle hills.  Lightheaded only when she stands up too quickly.  No orthopnea/PND.  No chest pain.  Weight is up 6 lbs.   [employer] device interrogation: Heart Logic score 0 (suggesting stable volume), 99% BiV pacing, no atrial fibrillation.    Labs (4/18): BNP 698, K 4.3, creatinine 1.13 => 0.94, BNP 599 Labs (5/18): BNP 206, K 4.2, creatinine 0.87 Labs (7/18): digoxin 0.4, K 3.7, creatinine 0.98 Labs (8/18): K 4.2, creatinine 0.75, hgb 14.1  PMH: 1. OSA 2. LBBB 3. h/o C difficile 4. Rheumatic fever as a child with possible rheumatic heart disease and severe MR.  - Echo (4/18): EF 25-30%, mild RV dilation with mildly  decreased RV systolic function, moderate TR, thickened MV leaflets with 2 MR jets (2 defined areas of malcoaptation), severe MR.   - TEE (4/18): EF 25-30%, mild LV dilation, confirms severe MR with the above features.  - TEE (8/18): EF 30-35%, septal-lateral dyssynchrony, moderate MR (likely functional).  5. Chronic systolic CHF: Nonischemic, prior myocarditis + valvular, versus all valvular.  - RHC/LHC (4/18): No significant CAD, severe MR.  Mean RA 18, PA 61/29, mean PCWP 30, CI 1.89.  - EF 30-35% on TEE 8/18.  - RHC (8/18): mean RA 1, PA 32/8, mean PCWP 6, CI 1.83 - [employer] CRT-D placed 8/18.  6. Anxiety/depression 7. Lung nodule by CT   SH: Married, lives in [place], retired Airline pilotaccountant, daughter lives in [place], nonsmoker, no ETOH.   FH: Mother with sudden cardiac death at 1684.   ROS: All systems reviewed and negative except as per HPI.   Current Outpatient Medications  Medication Sig Dispense Refill  . acetaminophen (TYLENOL) 650 MG CR tablet Take 1,300 mg by mouth daily as needed for pain.    [name] Kitchen. albuterol (PROVENTIL HFA;VENTOLIN HFA) 108 (90 Base) MCG/ACT inhaler Inhale 2 puffs into the lungs every 6 (six) hours as needed for wheezing or shortness of breath.    . allopurinol (ZYLOPRIM) 300 MG tablet Take 300 mg by mouth daily.    [name] Kitchen. ALPRAZolam (XANAX) 0.25 MG tablet Take 0.125 mg by mouth daily as needed for anxiety.     [name] Kitchen. aspirin EC 81 MG tablet Take 81 mg by  mouth every morning.     . cholecalciferol (VITAMIN D) 1000 UNITS tablet Take 1,000 Units by mouth daily.    . colchicine 0.6 MG tablet Take 0.6 mg by mouth daily as needed (FLAREUPS).    . Cyanocobalamin (VITAMIN B-12) 2500 MCG SUBL Place 2,500 mcg under the tongue daily.    . digoxin (LANOXIN) 0.125 MG tablet TAKE 1 TABLET (0.125 MG TOTAL) BY MOUTH DAILY. 30 tablet 3  . escitalopram (LEXAPRO) 20 MG tablet Take 20 mg by mouth every morning.     . fluticasone (FLONASE) 50 MCG/ACT nasal spray Place 1 spray into both  nostrils daily as needed for allergies or rhinitis.    . fluticasone furoate-vilanterol (BREO ELLIPTA) 200-25 MCG/INH AEPB Inhale 1 puff into the lungs daily.    . furosemide (LASIX) 40 MG tablet TAKE 1 TABLET (40 MG TOTAL) BY MOUTH 2 (TWO) TIMES DAILY. 60 tablet 3  . HYDROcodone-acetaminophen (NORCO/VICODIN) 5-325 MG tablet Take 1 tablet by mouth every 4 (four) hours as needed for moderate pain or severe pain. 10 tablet 0  . levalbuterol (XOPENEX) 0.63 MG/3ML nebulizer solution Take 0.63 mg by nebulization every 4 (four) hours as needed for wheezing or shortness of breath.    . levocetirizine (XYZAL) 5 MG tablet Take 5 mg by mouth every evening.    . metoprolol succinate (TOPROL-XL) 25 MG 24 hr tablet Take 25mg  (1 tablet) in the morning and 50mg  (2 tablets) in the evening. 90 tablet 6  . omeprazole (PRILOSEC) 20 MG capsule Take 20 mg by mouth daily.    [name] Kitchen Propylene Glycol (SYSTANE BALANCE) 0.6 % SOLN Place 1 drop into both eyes 2 (two) times daily as needed (DRY EYES).     . sacubitril-valsartan (ENTRESTO) 24-26 MG Take 1 tablet by mouth 2 (two) times daily. 180 tablet 3  . simvastatin (ZOCOR) 20 MG tablet Take 1 tablet (20 mg total) by mouth at bedtime. 30 tablet 3  . spironolactone (ALDACTONE) 25 MG tablet Take 25 mg by mouth daily.     No current facility-administered medications for this encounter.    BP 137/70   Pulse 63   Wt 271 lb 12.8 oz (123.3 kg)   LMP  (LMP Unknown)   SpO2 99%   BMI 39.56 kg/m  General: NAD Neck: No JVD, no thyromegaly or thyroid nodule.  Lungs: Clear to auscultation bilaterally with normal respiratory effort. CV: Nondisplaced PMI.  Heart regular S1/S2, no S3/S4, 1/6 HSM apex.  No peripheral edema.  No carotid bruit.  Normal pedal pulses.  Abdomen: Soft, nontender, no hepatosplenomegaly, no distention.  Skin: Intact without lesions or rashes.  Neurologic: Alert and oriented x 3.  Psych: Normal affect. Extremities: No clubbing or cyanosis.  HEENT: Normal.    Assessment/Plan: 1. Chronic systolic CHF: EF 16-10% on echo in 4/18, nonischemic cardiomyopathy.  No prior echo.  She has severe mitral regurgitation and a rheumatic-appearing mitral valve.  It is hard to know which was the initial problem (cardiomyopathy or mitral regurgitation), and hard to know how related they are.  Cannot rule out valvular cardiomyopathy due to long-term severe mitral regurgitation (insidious onset of symptoms almost a year ago, and had never had an echo).  Alternatively, she could have a nonischemic cardiomyopathy from some other etiology like myocarditis and have a co-existing rheumatic mitral valve with perhaps worsening of MR from annular dilatation (secondary MR). Followup TEE in 8/18 showed EF 30-35% with dyssynchrony but MR only moderate.  RHC showed low filling pressures.  Now  s/p [employer] CRT-D.  She is not volume overloaded on exam or by Heart Logic score.  NYHA class II, exercising more though weight is up.  - Increase Toprol XL to 25 qam/50 qpm.     - Continue Lasix 40 mg bid, BMET today.   - Continue Entresto and spironolactone at current doses.   - Continue digoxin, check level today.  - Echo in 3 months with followup appt.  2. Rheumatic heart disease: Severe MR on 4/18 TEE with a rheumatic-appearing valve, also moderate TR.  There are 2 jets of MR from 2 defined areas of malcoaptation.  As above, do not know if there is a co-existing nonischemic cardiomyopathy or if long-standing severe MR led to a cardiomyopathy.  Mitral regurgitation is likely mixed => rheumatic mitral valve disease but also concern for a component of secondary MR from annular dilatation.  Repeat TEE in 8/18 showed moderate MR, suspect there was a significant functional component.  No surgery at this point.  May improve further with CRT => repeat echo in 3 months.  3. LBBB: s/p CRT device placement.  4. Lung nodule: Repeat CT ordered through Dr Orvan July office.   5. Palpitations: Monitor  via device for afib.  None so far.   Followup in 3 months with echo.    Marca Ancona 06/26/2017

## 2017-07-10 ENCOUNTER — Encounter: Payer: Self-pay | Admitting: Internal Medicine

## 2017-07-10 ENCOUNTER — Ambulatory Visit: Payer: Medicare Other | Admitting: Internal Medicine

## 2017-07-10 VITALS — BP 120/82 | HR 64 | Ht 69.5 in | Wt 271.4 lb

## 2017-07-10 DIAGNOSIS — I4729 Other ventricular tachycardia: Secondary | ICD-10-CM

## 2017-07-10 DIAGNOSIS — Z95810 Presence of automatic (implantable) cardiac defibrillator: Secondary | ICD-10-CM

## 2017-07-10 DIAGNOSIS — I472 Ventricular tachycardia: Secondary | ICD-10-CM

## 2017-07-10 DIAGNOSIS — I428 Other cardiomyopathies: Secondary | ICD-10-CM | POA: Diagnosis not present

## 2017-07-10 DIAGNOSIS — I5022 Chronic systolic (congestive) heart failure: Secondary | ICD-10-CM

## 2017-07-10 NOTE — Progress Notes (Signed)
HPI Margaret Vargas returns today for ongoing evaluation and management of her chronic systolic heart failure status post insertion of a biventricular ICD. She is a very pleasant 66 year old woman with a long-standing nonischemic cardiomyopathy and mitral regurgitation. She has severe left ventricular dysfunction. She underwent biventricular ICD insertion several months ago. Her exercise ability has increased and she is walking up to 3-1/2 miles 4 times a week. She has had no syncope. She denies chest pain but does have some pruritus over her ICD incision. Allergies  Allergen Reactions  . Other Other (See Comments)    Bandaids and some adhesives cause red skin. (pls use paper tape)  . Adhesive [Tape] Itching and Rash  . Morphine And Related Itching     Current Outpatient Medications  Medication Sig Dispense Refill  . acetaminophen (TYLENOL) 650 MG CR tablet Take 1,300 mg by mouth daily as needed for pain.    [name] Kitchen albuterol (PROVENTIL HFA;VENTOLIN HFA) 108 (90 Base) MCG/ACT inhaler Inhale 2 puffs into the lungs every 6 (six) hours as needed for wheezing or shortness of breath.    . allopurinol (ZYLOPRIM) 300 MG tablet Take 300 mg by mouth daily.    [name] Kitchen ALPRAZolam (XANAX) 0.25 MG tablet Take 0.125 mg by mouth daily as needed for anxiety.     [name] Kitchen aspirin EC 81 MG tablet Take 81 mg by mouth every morning.     . cholecalciferol (VITAMIN D) 1000 UNITS tablet Take 1,000 Units by mouth daily.    . colchicine 0.6 MG tablet Take 0.6 mg by mouth daily as needed (FLAREUPS).    . Cyanocobalamin (VITAMIN B-12) 2500 MCG SUBL Place 2,500 mcg under the tongue daily.    . digoxin (LANOXIN) 0.125 MG tablet TAKE 1 TABLET (0.125 MG TOTAL) BY MOUTH DAILY. 30 tablet 3  . escitalopram (LEXAPRO) 20 MG tablet Take 20 mg by mouth every morning.     . fluticasone (FLONASE) 50 MCG/ACT nasal spray Place 1 spray into both nostrils daily as needed for allergies or rhinitis.    . fluticasone furoate-vilanterol (BREO  ELLIPTA) 200-25 MCG/INH AEPB Inhale 1 puff into the lungs daily.    . furosemide (LASIX) 40 MG tablet TAKE 1 TABLET (40 MG TOTAL) BY MOUTH 2 (TWO) TIMES DAILY. 60 tablet 3  . HYDROcodone-acetaminophen (NORCO/VICODIN) 5-325 MG tablet Take 1 tablet by mouth every 4 (four) hours as needed for moderate pain or severe pain. 10 tablet 0  . levalbuterol (XOPENEX) 0.63 MG/3ML nebulizer solution Take 0.63 mg by nebulization every 4 (four) hours as needed for wheezing or shortness of breath.    . levocetirizine (XYZAL) 5 MG tablet Take 5 mg by mouth every evening.    . metoprolol succinate (TOPROL-XL) 25 MG 24 hr tablet Take 25mg  (1 tablet) in the morning and 50mg  (2 tablets) in the evening. 90 tablet 6  . omeprazole (PRILOSEC) 20 MG capsule Take 20 mg by mouth daily.    [name] Kitchen Propylene Glycol (SYSTANE BALANCE) 0.6 % SOLN Place 1 drop into both eyes 2 (two) times daily as needed (DRY EYES).     . sacubitril-valsartan (ENTRESTO) 24-26 MG Take 1 tablet by mouth 2 (two) times daily. 180 tablet 3  . simvastatin (ZOCOR) 20 MG tablet Take 1 tablet (20 mg total) by mouth at bedtime. 30 tablet 3  . spironolactone (ALDACTONE) 25 MG tablet Take 25 mg by mouth daily.     No current facility-administered medications for this visit.      Past Medical  History:  Diagnosis Date  . Acute CHF (congestive heart failure) (HCC) 11/16/2016   [name] 11/16/2016  . AICD (automatic cardioverter/defibrillator) present 04/06/2017  . Anxiety    [name] 11/16/2016  . Arthritis    "neck" (11/16/2016)  . Asthma   . Chronic bronchitis (HCC)   . Chronic neck pain   . Depression    [name] 11/16/2016  . Gout   . 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 Heart murmur    "when I was a child"  . High cholesterol   . History of blood transfusion    "w/a back OR"  . Hypertension    [name] 11/16/2016  . Incidental lung nodule, greater than or equal to 8mm 01/02/2017   Probable granuloma right upper lobe - needs f/u imaging  . Pre-diabetes   .  Sleep apnea    "need a mask/tests" (11/16/2016)  . Stroke [facility]) 05/2008   "mini stroke"; denies residual on 11/16/2016  . Thyroid nodule   . TIA (transient ischemic attack) "several"    ROS:   All systems reviewed and negative except as noted in the HPI.   Past Surgical History:  Procedure Laterality Date  . BACK SURGERY    . BIV ICD INSERTION CRT-D N/A 04/06/2017   Procedure: BIV ICD INSERTION CRT-D;  Surgeon: Marinus Maw, MD;  Location: [facility] INVASIVE CV LAB;  Service: Cardiovascular;  Laterality: N/A;  . INNER EAR SURGERY Right 1980s  . INSERTION OF ICD  04/06/2017   biv  . LAPAROSCOPIC CHOLECYSTECTOMY  1990s  . NEUROPLASTY / TRANSPOSITION MEDIAN NERVE AT CARPAL TUNNEL Left ~ 2010  . POSTERIOR FUSION LUMBAR SPINE  2005  . RIGHT HEART CATH N/A 03/27/2017   Procedure: RIGHT HEART CATH;  Surgeon: Laurey Morale, MD;  Location: [facility] INVASIVE CV LAB;  Service: Cardiovascular;  Laterality: N/A;  . RIGHT OOPHORECTOMY Right 1990s  . RIGHT/LEFT HEART CATH AND CORONARY ANGIOGRAPHY N/A 11/21/2016   Procedure: Right/Left Heart Cath and Coronary Angiography;  Surgeon: Kathleene Hazel, MD;  Location: [facility] INVASIVE CV LAB;  Service: Cardiovascular;  Laterality: N/A;  . TEAR DUCT PROBING Bilateral 2000s  . TEE WITHOUT CARDIOVERSION N/A 11/22/2016   Procedure: TRANSESOPHAGEAL ECHOCARDIOGRAM (TEE);  Surgeon: Chrystie Nose, MD;  Location: [facility] ENDOSCOPY;  Service: Cardiovascular;  Laterality: N/A;  . TEE WITHOUT CARDIOVERSION N/A 03/29/2017   Procedure: TRANSESOPHAGEAL ECHOCARDIOGRAM (TEE);  Surgeon: Laurey Morale, MD;  Location: [facility] ENDOSCOPY;  Service: Cardiovascular;  Laterality: N/A;  . VAGINAL HYSTERECTOMY  1989     Family History  Problem Relation Age of Onset  . Hypertension Mother      Social History   Socioeconomic History  . Marital status: Married    Spouse name: Not on file  . Number of children: Not on file  . Years of education: Not on file  . Highest education level:  Not on file  Social Needs  . Financial resource strain: Not on file  . Food insecurity - worry: Not on file  . Food insecurity - inability: Not on file  . Transportation needs - medical: Not on file  . Transportation needs - non-medical: Not on file  Occupational History  . Not on file  Tobacco Use  . Smoking status: Former Smoker    Packs/day: 0.75    Years: 13.00    Pack years: 9.75    Types: Cigarettes    Last attempt to quit: 1989    Years since quitting: 29.9  . Smokeless  tobacco: Never Used  Substance and Sexual Activity  . Alcohol use: Yes    Comment: 11/16/2016 "might have 3-4 drinks/month"  . Drug use: No    Comment: 11/16/2016 "I smoked marijuana in college"  . Sexual activity: Not on file  Other Topics Concern  . Not on file  Social History Narrative  . Not on file     BP 120/82   Pulse 64   Ht 5' 9.5" (1.765 m)   Wt 271 lb 6.4 oz (123.1 kg)   LMP  (LMP Unknown)   SpO2 98%   BMI 39.50 kg/m   Physical Exam:  Well appearing obese, 66 year old woman, NAD HEENT: Unremarkable Neck:   6 cm JVD, no thyromegally Lymphatics:  No adenopathy Back:  No CVA tenderness Lungs:  Clear, with no wheezes, rales, or rhonchi. HEART:  Regular rate rhythm, no murmurs, no rubs, no clicks Abd:  soft, positive bowel sounds, no organomegally, no rebound, no guarding Ext:  2 plus pulses, no edema, no cyanosis, no clubbing Skin:  No rashes no nodules Neuro:  CN II through XII intact, motor grossly intact  EKG normal sinus rhythm with biventricular pacing -   DEVICE  Normal device function.  See PaceArt for details.   Assess/Plan: 1. Chronic systolic heart failure - she remains class II. She will continue her current medical therapy. 2. Bi-V ICD - her [employer] biventricular ICD is working normally. We'll adjust her outputs to maximize battery longevity. 3. Obesity - the patient is exercising regularly. Hopefully she can lose some weight.  Lewayne BuntingGregg Yossi Hinchman, M.D.

## 2017-07-10 NOTE — Patient Instructions (Signed)
Medication Instructions:  Your physician recommends that you continue on your current medications as directed. Please refer to the Current Medication list given to you today.  Labwork: None ordered.  Testing/Procedures: None ordered.  Follow-Up: Your physician wants you to follow-up in: 9 months with Dr. Taylor.   You will receive a reminder letter in the mail two months in advance. If you don't receive a letter, please call our office to schedule the follow-up appointment.  Remote monitoring is used to monitor your ICD from home. This monitoring reduces the number of office visits required to check your device to one time per year. It allows us to keep an eye on the functioning of your device to ensure it is working properly. You are scheduled for a device check from home on 10/09/2017. You may send your transmission at any time that day. If you have a wireless device, the transmission will be sent automatically. After your physician reviews your transmission, you will receive a postcard with your next transmission date.  Any Other Special Instructions Will Be Listed Below (If Applicable).  If you need a refill on your cardiac medications before your next appointment, please call your pharmacy.   

## 2017-07-11 LAB — CUP PACEART INCLINIC DEVICE CHECK
HIGH POWER IMPEDANCE MEASURED VALUE: 71 Ohm
HIGH POWER IMPEDANCE MEASURED VALUE: 83 Ohm
Implantable Lead Implant Date: 20180823
Implantable Lead Location: 753858
Implantable Lead Model: 293
Implantable Lead Model: 4674
Implantable Lead Model: 7741
Implantable Lead Serial Number: 435016
Implantable Lead Serial Number: 609786
Implantable Lead Serial Number: 872654
Lead Channel Impedance Value: 429 Ohm
Lead Channel Impedance Value: 578 Ohm
Lead Channel Pacing Threshold Amplitude: 0.9 V
Lead Channel Pacing Threshold Pulse Width: 0.4 ms
Lead Channel Pacing Threshold Pulse Width: 0.4 ms
Lead Channel Pacing Threshold Pulse Width: 0.8 ms
Lead Channel Sensing Intrinsic Amplitude: 23.6 mV
Lead Channel Sensing Intrinsic Amplitude: 5.1 mV
Lead Channel Setting Pacing Amplitude: 2.3 V
Lead Channel Setting Pacing Pulse Width: 0.8 ms
Lead Channel Setting Sensing Sensitivity: 0.5 mV
MDC IDC LEAD IMPLANT DT: 20180823
MDC IDC LEAD IMPLANT DT: 20180823
MDC IDC LEAD LOCATION: 753859
MDC IDC LEAD LOCATION: 753860
MDC IDC MSMT LEADCHNL LV PACING THRESHOLD AMPLITUDE: 1.3 V
MDC IDC MSMT LEADCHNL RA IMPEDANCE VALUE: 601 Ohm
MDC IDC MSMT LEADCHNL RA PACING THRESHOLD AMPLITUDE: 0.6 V
MDC IDC MSMT LEADCHNL RV SENSING INTR AMPL: 13.8 mV
MDC IDC PG IMPLANT DT: 20180823
MDC IDC PG SERIAL: 186593
MDC IDC SESS DTM: 20181126050000
MDC IDC SET LEADCHNL LV SENSING SENSITIVITY: 1 mV
MDC IDC SET LEADCHNL RA PACING AMPLITUDE: 2 V
MDC IDC SET LEADCHNL RV PACING AMPLITUDE: 2.5 V
MDC IDC SET LEADCHNL RV PACING PULSEWIDTH: 0.4 ms
MDC IDC STAT BRADY RA PERCENT PACED: 4 %
MDC IDC STAT BRADY RV PERCENT PACED: 1 %

## 2017-08-05 ENCOUNTER — Other Ambulatory Visit (HOSPITAL_COMMUNITY): Payer: Self-pay | Admitting: Internal Medicine

## 2017-08-31 ENCOUNTER — Other Ambulatory Visit: Payer: Self-pay | Admitting: Family Medicine

## 2017-08-31 DIAGNOSIS — R911 Solitary pulmonary nodule: Secondary | ICD-10-CM

## 2017-09-05 ENCOUNTER — Other Ambulatory Visit (HOSPITAL_COMMUNITY): Payer: Self-pay | Admitting: Cardiology

## 2017-09-11 ENCOUNTER — Other Ambulatory Visit (HOSPITAL_COMMUNITY): Payer: Self-pay | Admitting: Cardiology

## 2017-09-26 ENCOUNTER — Encounter (HOSPITAL_COMMUNITY): Payer: Self-pay | Admitting: Cardiology

## 2017-09-26 ENCOUNTER — Ambulatory Visit (HOSPITAL_COMMUNITY)
Admission: RE | Admit: 2017-09-26 | Discharge: 2017-09-26 | Disposition: A | Discharge status: Home / Self Care | Payer: Medicare Other | Source: Ambulatory Visit | Attending: Family Medicine | Admitting: Family Medicine

## 2017-09-26 ENCOUNTER — Ambulatory Visit (HOSPITAL_BASED_OUTPATIENT_CLINIC_OR_DEPARTMENT_OTHER)
Admission: RE | Admit: 2017-09-26 | Discharge: 2017-09-26 | Disposition: A | Discharge status: Home / Self Care | Payer: Medicare Other | Source: Ambulatory Visit | Attending: Cardiology | Admitting: Cardiology

## 2017-09-26 VITALS — BP 132/54 | HR 57 | Wt 280.4 lb

## 2017-09-26 DIAGNOSIS — R911 Solitary pulmonary nodule: Secondary | ICD-10-CM | POA: Diagnosis not present

## 2017-09-26 DIAGNOSIS — I447 Left bundle-branch block, unspecified: Secondary | ICD-10-CM

## 2017-09-26 DIAGNOSIS — I4891 Unspecified atrial fibrillation: Secondary | ICD-10-CM | POA: Insufficient documentation

## 2017-09-26 DIAGNOSIS — F329 Major depressive disorder, single episode, unspecified: Secondary | ICD-10-CM | POA: Diagnosis not present

## 2017-09-26 DIAGNOSIS — I110 Hypertensive heart disease with heart failure: Secondary | ICD-10-CM | POA: Insufficient documentation

## 2017-09-26 DIAGNOSIS — I051 Rheumatic mitral insufficiency: Secondary | ICD-10-CM

## 2017-09-26 DIAGNOSIS — I5022 Chronic systolic (congestive) heart failure: Secondary | ICD-10-CM

## 2017-09-26 DIAGNOSIS — Z7982 Long term (current) use of aspirin: Secondary | ICD-10-CM | POA: Insufficient documentation

## 2017-09-26 DIAGNOSIS — I099 Rheumatic heart disease, unspecified: Secondary | ICD-10-CM | POA: Diagnosis not present

## 2017-09-26 DIAGNOSIS — Z79891 Long term (current) use of opiate analgesic: Secondary | ICD-10-CM | POA: Insufficient documentation

## 2017-09-26 DIAGNOSIS — Z8249 Family history of ischemic heart disease and other diseases of the circulatory system: Secondary | ICD-10-CM | POA: Diagnosis not present

## 2017-09-26 DIAGNOSIS — G4733 Obstructive sleep apnea (adult) (pediatric): Secondary | ICD-10-CM | POA: Insufficient documentation

## 2017-09-26 DIAGNOSIS — I340 Nonrheumatic mitral (valve) insufficiency: Secondary | ICD-10-CM | POA: Insufficient documentation

## 2017-09-26 DIAGNOSIS — I429 Cardiomyopathy, unspecified: Secondary | ICD-10-CM | POA: Diagnosis not present

## 2017-09-26 DIAGNOSIS — R002 Palpitations: Secondary | ICD-10-CM | POA: Insufficient documentation

## 2017-09-26 DIAGNOSIS — E669 Obesity, unspecified: Secondary | ICD-10-CM | POA: Insufficient documentation

## 2017-09-26 DIAGNOSIS — Z79899 Other long term (current) drug therapy: Secondary | ICD-10-CM | POA: Diagnosis not present

## 2017-09-26 LAB — BASIC METABOLIC PANEL
ANION GAP: 10 (ref 5–15)
BUN: 9 mg/dL (ref 6–20)
CALCIUM: 9.5 mg/dL (ref 8.9–10.3)
CHLORIDE: 103 mmol/L (ref 101–111)
CO2: 27 mmol/L (ref 22–32)
Creatinine, Ser: 0.94 mg/dL (ref 0.44–1.00)
GFR calc non Af Amer: >60 mL/min (ref >60)
Glucose, Bld: 119 mg/dL — ABNORMAL HIGH (ref 65–99)
POTASSIUM: 3.7 mmol/L (ref 3.5–5.1)
Sodium: 140 mmol/L (ref 135–145)

## 2017-09-26 MED ORDER — FUROSEMIDE 40 MG PO TABS: ORAL_TABLET | ORAL | 3 refills | Status: DC

## 2017-09-26 NOTE — Progress Notes (Signed)
PCP: Dr. Paulino Rily HF Cardiology: Dr. Shirlee Latch  67 yo with history of suspected rheumatic fever, nonischemic cardiomyopathy, and severe mitral regurgitation presents for followup of CHF and mitral regurgitation.  Patient had an episode in childhood that was thought to be rheumatic fever.  She had a murmur as a child, but never had an echocardiogram.  Starting last summer, she developed exertional dyspnea. This has been progressive since that time.  She never saw a cardiologist until she was admitted in 4/18 with worsening dyspnea and weight gain.  Weight increased about 35 lbs from last summer.   She was diuresed in the hospital and had an echo and a TEE, showing EF 25-30% with rheumatic-appearing mitral valve and severe mitral regurgitation.  She has been seen by Dr Barry Dienes for evaluation for surgical repair/replacement of the MV.  Given improvement in symptoms, he suggested repeating TEE eventually to reassess.  Therefore, she had repeat RHC and TEE in 8/18 after medical therapy.  EF was 30-35% with septal-lateral dyssynchrony and there was only moderate functional MR.  RHC showed low filling pressures.  She had [employer] CRT-D placed in 8/18.   Echo today was reviewed, shows EF up to 55-60% with mild MR.   Weight is up about 9 lbs since last visit.  Has not been exercising, has had trouble with depression (best friend died from cancer within the last year).  She started Wellbutrin and his has helped.  She fatigues easily but denies exertional dyspnea with her normal activities.  No chest pain.  No orthopnea/PND.  No edema.   [employer] device interrogation: Heart Logic score 0 (suggesting stable volume), 99% BiV pacing  Labs (4/18): BNP 698, K 4.3, creatinine 1.13 => 0.94, BNP 599 Labs (5/18): BNP 206, K 4.2, creatinine 0.87 Labs (7/18): digoxin 0.4, K 3.7, creatinine 0.98 Labs (8/18): K 4.2, creatinine 0.75, hgb 14.1 Labs (1/19): K 4.3, creatinine 0.96  PMH: 1. OSA 2. LBBB 3. h/o C  difficile 4. Rheumatic fever as a child with possible rheumatic heart disease and severe MR.  - Echo (4/18): EF 25-30%, mild RV dilation with mildly decreased RV systolic function, moderate TR, thickened MV leaflets with 2 MR jets (2 defined areas of malcoaptation), severe MR.   - TEE (4/18): EF 25-30%, mild LV dilation, confirms severe MR with the above features.  - TEE (8/18): EF 30-35%, septal-lateral dyssynchrony, moderate MR (likely functional).  - Echo (1/19): EF 55-60%, mild MR.  5. Chronic systolic CHF: Nonischemic, prior myocarditis + valvular, versus all valvular.  - RHC/LHC (4/18): No significant CAD, severe MR.  Mean RA 18, PA 61/29, mean PCWP 30, CI 1.89.  - EF 30-35% on TEE 8/18.  - RHC (8/18): mean RA 1, PA 32/8, mean PCWP 6, CI 1.83 - [employer] CRT-D placed 8/18.  6. Anxiety/depression 7. Lung nodule by CT   SH: Married, lives in [place], retired Airline pilot, daughter lives in [place], nonsmoker, no ETOH.   FH: Mother with sudden cardiac death at 60.   ROS: All systems reviewed and negative except as per HPI.   Current Outpatient Medications  Medication Sig Dispense Refill  . acetaminophen (TYLENOL) 650 MG CR tablet Take 1,300 mg by mouth daily as needed for pain.    [name] Kitchen albuterol (PROVENTIL HFA;VENTOLIN HFA) 108 (90 Base) MCG/ACT inhaler Inhale 2 puffs into the lungs every 6 (six) hours as needed for wheezing or shortness of breath.    . allopurinol (ZYLOPRIM) 300 MG tablet Take 300 mg by mouth  daily.    [name] Kitchen ALPRAZolam (XANAX) 0.25 MG tablet Take 0.125 mg by mouth daily as needed for anxiety.     [name] Kitchen aspirin EC 81 MG tablet Take 81 mg by mouth every morning.     [name] Kitchen buPROPion (WELLBUTRIN) 100 MG tablet Take 100 mg by mouth 2 (two) times daily.    . cholecalciferol (VITAMIN D) 1000 UNITS tablet Take 1,000 Units by mouth daily.    . colchicine 0.6 MG tablet Take 0.6 mg by mouth daily as needed (FLAREUPS).    . Cyanocobalamin (VITAMIN B-12) 2500 MCG SUBL Place 2,500 mcg  under the tongue daily.    [name] Kitchen escitalopram (LEXAPRO) 20 MG tablet Take 20 mg by mouth every morning.     . fluticasone (FLONASE) 50 MCG/ACT nasal spray Place 1 spray into both nostrils daily as needed for allergies or rhinitis.    . fluticasone furoate-vilanterol (BREO ELLIPTA) 200-25 MCG/INH AEPB Inhale 1 puff into the lungs daily.    . furosemide (LASIX) 40 MG tablet Take 40 mg (1 tab) in AM, then 20 mg (0.5 tab) in PM 45 tablet 3  . HYDROcodone-acetaminophen (NORCO/VICODIN) 5-325 MG tablet Take 1 tablet by mouth every 4 (four) hours as needed for moderate pain or severe pain. 10 tablet 0  . levalbuterol (XOPENEX) 0.63 MG/3ML nebulizer solution Take 0.63 mg by nebulization every 4 (four) hours as needed for wheezing or shortness of breath.    . levocetirizine (XYZAL) 5 MG tablet Take 5 mg by mouth every evening.    . metoprolol succinate (TOPROL-XL) 25 MG 24 hr tablet Take 25mg  (1 tablet) in the morning and 50mg  (2 tablets) in the evening. 90 tablet 6  . omeprazole (PRILOSEC) 20 MG capsule Take 20 mg by mouth daily.    [name] Kitchen Propylene Glycol (SYSTANE BALANCE) 0.6 % SOLN Place 1 drop into both eyes 2 (two) times daily as needed (DRY EYES).     . sacubitril-valsartan (ENTRESTO) 24-26 MG Take 1 tablet by mouth 2 (two) times daily. 180 tablet 3  . simvastatin (ZOCOR) 20 MG tablet TAKE 1 TABLET AT BEDTIME 30 tablet 3  . spironolactone (ALDACTONE) 25 MG tablet Take 25 mg by mouth daily.     No current facility-administered medications for this encounter.    BP (!) 132/54   Pulse (!) 57   Wt 280 lb 6.4 oz (127.2 kg)   LMP  (LMP Unknown)   SpO2 100%   BMI 40.81 kg/m  General: NAD, obese Neck: No JVD, no thyromegaly or thyroid nodule.  Lungs: Clear to auscultation bilaterally with normal respiratory effort. CV: Nondisplaced PMI.  Heart regular S1/S2, no S3/S4, no murmur.  No peripheral edema.  No carotid bruit.  Normal pedal pulses.  Abdomen: Soft, nontender, no hepatosplenomegaly, no distention.   Skin: Intact without lesions or rashes.  Neurologic: Alert and oriented x 3.  Psych: Normal affect. Extremities: No clubbing or cyanosis.  HEENT: Normal.   Assessment/Plan: 1. Chronic systolic CHF: EF 65-78% on echo in 4/18, nonischemic cardiomyopathy.  She had severe mitral regurgitation and a rheumatic-appearing mitral valve.  It is hard to know which was the initial problem (cardiomyopathy or mitral regurgitation), and hard to know how related they are.  However, based on most recent echo, suspect that she has a nonischemic cardiomyopathy from some other etiology like myocarditis and has a co-existing rheumatic mitral valve with perhaps worsening of MR from annular dilatation (secondary MR). Followup TEE in 8/18 showed EF 30-35% with dyssynchrony but MR only moderate.  RHC showed low filling pressures.  Now s/p [employer] CRT-D.  Echo was done today and reviewed; EF up to 55-60% and MR was only mild.  She is not volume overloaded on exam or by Heart Logic score.  NYHA class II, weight is up but suspect this is caloric rather than volume retention.   - We discussed how there is not very good evidence regarding stopping cardiomyopathy medications after normalization of EF.  I would recommend that she continue current Toprol XL, Entresto, and spironolactone.  She can stop digoxin.   - Decrease Lasix to 40 qam/20 qpm.    - BMET today.  2. Rheumatic heart disease: Severe MR on 4/18 TEE with a rheumatic-appearing valve, also moderate TR.  There were 2 jets of MR from 2 defined areas of malcoaptation.  As above, I suspect there was a co-existing nonischemic cardiomyopathy with a component of functional MR from annular dilatation.  Repeat TEE in 8/18 showed moderate MR, and echo today with normalization of EF showed only mild MR.  It appears that most of the MR was functional.  3. LBBB: s/p CRT device placement.  4. Lung nodule: Repeat CT ordered through Dr Orvan July office.   5. Palpitations: Monitor  via device for afib.  None so far.  6. Obesity: I strongly recommended that she work on diet/exercise for weight loss.  7. Depression: Significant.  She is on Wellbutrin, suggested that she work with a Theatre manager.   Followup in 6 months.    Marca Ancona 09/26/2017

## 2017-09-26 NOTE — Patient Instructions (Signed)
Stop Digoxin  Decrease Furosemide 40 mg (1 tab) in AM, then 20 mg (0.5 tab) in PM  Labs drawn today (if we do not call you, then your lab work was stable)   Your physician recommends that you schedule a follow-up appointment in: 6 months with Dr. Shirlee Latch  (we will call you)

## 2017-10-09 ENCOUNTER — Ambulatory Visit (INDEPENDENT_AMBULATORY_CARE_PROVIDER_SITE_OTHER): Payer: Medicare Other | Admitting: *Deleted

## 2017-10-09 DIAGNOSIS — I428 Other cardiomyopathies: Secondary | ICD-10-CM | POA: Diagnosis not present

## 2017-10-09 LAB — CUP PACEART REMOTE DEVICE CHECK
Implantable Lead Implant Date: 20180823
Implantable Lead Location: 753858
Implantable Lead Location: 753859
Implantable Lead Location: 753860
Implantable Lead Model: 293
Implantable Lead Model: 4674
Lead Channel Setting Pacing Amplitude: 2.3 V
Lead Channel Setting Pacing Pulse Width: 0.4 ms
Lead Channel Setting Sensing Sensitivity: 0.5 mV
Lead Channel Setting Sensing Sensitivity: 1 mV
MDC IDC LEAD IMPLANT DT: 20180823
MDC IDC LEAD IMPLANT DT: 20180823
MDC IDC LEAD SERIAL: 435016
MDC IDC LEAD SERIAL: 609786
MDC IDC LEAD SERIAL: 872654
MDC IDC PG IMPLANT DT: 20180823
MDC IDC SESS DTM: 20190314111211
MDC IDC SET LEADCHNL LV PACING PULSEWIDTH: 0.8 ms
MDC IDC SET LEADCHNL RA PACING AMPLITUDE: 2 V
MDC IDC SET LEADCHNL RV PACING AMPLITUDE: 2.5 V
Pulse Gen Serial Number: 186593

## 2017-10-09 NOTE — Progress Notes (Signed)
Remote ICD transmission.   

## 2017-10-12 ENCOUNTER — Encounter: Payer: Self-pay | Admitting: Cardiology

## 2017-10-24 ENCOUNTER — Other Ambulatory Visit: Payer: Self-pay | Admitting: Internal Medicine

## 2017-12-29 ENCOUNTER — Telehealth (HOSPITAL_COMMUNITY): Payer: Self-pay | Admitting: *Deleted

## 2017-12-29 NOTE — Telephone Encounter (Signed)
Pt called concerned about wt gain and sob.  She states she has gained wt gradually and she is at 287 lb now (was 280 in Feb).  She states she gets SOB w/exertion, if goes up stairs she has to stop and catch her breath.  She denies edema.  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  appt sch for Mon 5/20 at 12 with Tonye Becket, NP

## 2018-01-01 ENCOUNTER — Ambulatory Visit (HOSPITAL_COMMUNITY)
Admission: RE | Admit: 2018-01-01 | Discharge: 2018-01-01 | Disposition: A | Discharge status: Home / Self Care | Payer: Medicare Other | Source: Ambulatory Visit | Attending: Internal Medicine | Admitting: Internal Medicine

## 2018-01-01 ENCOUNTER — Other Ambulatory Visit (HOSPITAL_COMMUNITY): Payer: Self-pay | Admitting: Cardiology

## 2018-01-01 ENCOUNTER — Encounter (HOSPITAL_COMMUNITY): Payer: Self-pay

## 2018-01-01 VITALS — BP 122/78 | HR 81 | Wt 291.2 lb

## 2018-01-01 DIAGNOSIS — Z7982 Long term (current) use of aspirin: Secondary | ICD-10-CM | POA: Diagnosis not present

## 2018-01-01 DIAGNOSIS — Z7951 Long term (current) use of inhaled steroids: Secondary | ICD-10-CM | POA: Insufficient documentation

## 2018-01-01 DIAGNOSIS — I081 Rheumatic disorders of both mitral and tricuspid valves: Secondary | ICD-10-CM | POA: Insufficient documentation

## 2018-01-01 DIAGNOSIS — F419 Anxiety disorder, unspecified: Secondary | ICD-10-CM | POA: Insufficient documentation

## 2018-01-01 DIAGNOSIS — Z79899 Other long term (current) drug therapy: Secondary | ICD-10-CM | POA: Insufficient documentation

## 2018-01-01 DIAGNOSIS — Z95810 Presence of automatic (implantable) cardiac defibrillator: Secondary | ICD-10-CM | POA: Insufficient documentation

## 2018-01-01 DIAGNOSIS — G4733 Obstructive sleep apnea (adult) (pediatric): Secondary | ICD-10-CM | POA: Insufficient documentation

## 2018-01-01 DIAGNOSIS — I428 Other cardiomyopathies: Secondary | ICD-10-CM | POA: Insufficient documentation

## 2018-01-01 DIAGNOSIS — R911 Solitary pulmonary nodule: Secondary | ICD-10-CM | POA: Insufficient documentation

## 2018-01-01 DIAGNOSIS — I340 Nonrheumatic mitral (valve) insufficiency: Secondary | ICD-10-CM | POA: Diagnosis not present

## 2018-01-01 DIAGNOSIS — Z6841 Body Mass Index (BMI) 40.0 and over, adult: Secondary | ICD-10-CM | POA: Insufficient documentation

## 2018-01-01 DIAGNOSIS — E669 Obesity, unspecified: Secondary | ICD-10-CM | POA: Insufficient documentation

## 2018-01-01 DIAGNOSIS — R002 Palpitations: Secondary | ICD-10-CM | POA: Diagnosis not present

## 2018-01-01 DIAGNOSIS — I5022 Chronic systolic (congestive) heart failure: Secondary | ICD-10-CM | POA: Diagnosis not present

## 2018-01-01 DIAGNOSIS — F329 Major depressive disorder, single episode, unspecified: Secondary | ICD-10-CM | POA: Insufficient documentation

## 2018-01-01 DIAGNOSIS — I447 Left bundle-branch block, unspecified: Secondary | ICD-10-CM | POA: Insufficient documentation

## 2018-01-01 DIAGNOSIS — F32A Depression, unspecified: Secondary | ICD-10-CM

## 2018-01-01 LAB — BASIC METABOLIC PANEL
ANION GAP: 11 (ref 5–15)
BUN: 12 mg/dL (ref 6–20)
CO2: 25 mmol/L (ref 22–32)
Calcium: 9.6 mg/dL (ref 8.9–10.3)
Chloride: 103 mmol/L (ref 101–111)
Creatinine, Ser: 0.91 mg/dL (ref 0.44–1.00)
GFR calc Af Amer: >60 mL/min (ref >60)
GFR calc non Af Amer: >60 mL/min (ref >60)
GLUCOSE: 127 mg/dL — AB (ref 65–99)
POTASSIUM: 3.8 mmol/L (ref 3.5–5.1)
Sodium: 139 mmol/L (ref 135–145)

## 2018-01-01 NOTE — Patient Instructions (Signed)
Routine lab work today. Will notify you of abnormal results, otherwise no news is good news!  No changes to medication at this time.  Follow up with Dr. Shirlee Latch in August.  ______________________________________________________________ [place] Code: 1500  Take all medication as prescribed the day of your appointment. Bring all medications with you to your appointment.  Do the following things EVERYDAY: 1) Weigh yourself in the morning before breakfast. Write it down and keep it in a log. 2) Take your medicines as prescribed 3) Eat low salt foods-Limit salt (sodium) to 2000 mg per day.  4) Stay as active as you can everyday 5) Limit all fluids for the day to less than 2 liters

## 2018-01-01 NOTE — Progress Notes (Signed)
Advanced Heart Failure Clinic Note  PCP: Dr. Paulino Rily HF Cardiology: Dr. Elyn Aquas Margaret Vargas is a 67 y.o. female with a history of suspected rheumatic fever, nonischemic cardiomyopathy, and severe mitral regurgitation presents for followup of CHF and mitral regurgitation.  Patient had an episode in childhood that was thought to be rheumatic fever.  She had a murmur as a child, but never had an echocardiogram.  Starting last summer, she developed exertional dyspnea. This has been progressive since that time.  She never saw a cardiologist until she was admitted in 4/18 with worsening dyspnea and weight gain.  Weight increased about 35 lbs from last summer.   She was diuresed in the hospital and had an echo and a TEE, showing EF 25-30% with rheumatic-appearing mitral valve and severe mitral regurgitation.  She has been seen by Dr Barry Dienes for evaluation for surgical repair/replacement of the MV.  Given improvement in symptoms, he suggested repeating TEE eventually to reassess.  Therefore, she had repeat RHC and TEE in 8/18 after medical therapy.  EF was 30-35% with septal-lateral dyssynchrony and there was only moderate functional MR.  RHC showed low filling pressures.  She had [employer] CRT-D placed in 8/18.   Echo 09/2017 shows EF up to 55-60% with mild MR.   Today she presents as an add on for SOB and weight gain. Overall doing okay. She has gained 11 lbs since last visit in February. She has had progressive SOB with exertion over at least 2 weeks (husband states that it has been longer). She is SOB with stairs and walking longer distances on flat road. 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 She has mild orthopnea and no edema. Denies CP and dizziness. She misses her evening doses of medication 2x/week. She is eating salty foods and drinking more than 2 liters/day. Weights have increased 30 lbs since last summer.  [employer] device  interrogation: Heart Logic score 5 (suggesting stable volume), thoracic impedence elevated. Personally reviewed.   Labs (4/18): BNP 698, K 4.3, creatinine 1.13 => 0.94, BNP 599 Labs (5/18): BNP 206, K 4.2, creatinine 0.87 Labs (7/18): digoxin 0.4, K 3.7, creatinine 0.98 Labs (8/18): K 4.2, creatinine 0.75, hgb 14.1 Labs (1/19): K 4.3, creatinine 0.96 Labs (2/19): K 3.7, creatinine 0.94  PMH: 1. OSA 2. LBBB 3. h/o C difficile 4. Rheumatic fever as a child with possible rheumatic heart disease and severe MR.  - Echo (4/18): EF 25-30%, mild RV dilation with mildly decreased RV systolic function, moderate TR, thickened MV leaflets with 2 MR jets (2 defined areas of malcoaptation), severe MR.   - TEE (4/18): EF 25-30%, mild LV dilation, confirms severe MR with the above features.  - TEE (8/18): EF 30-35%, septal-lateral dyssynchrony, moderate MR (likely functional).  - Echo (1/19): EF 55-60%, mild MR.  5. Chronic systolic CHF: Nonischemic, prior myocarditis + valvular, versus all valvular.  - RHC/LHC (4/18): No significant CAD, severe MR.  Mean RA 18, PA 61/29, mean PCWP 30, CI 1.89.  - EF 30-35% on TEE 8/18.  - RHC (8/18): mean RA 1, PA 32/8, mean PCWP 6, CI 1.83 - [employer] CRT-D placed 8/18.  6. Anxiety/depression 7. 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   FH: Mother with sudden cardiac death at 80.   Review of systems complete and found to be negative unless listed in HPI.  Current Outpatient Medications  Medication Sig Dispense Refill  . acetaminophen (TYLENOL) 650 MG CR tablet Take 1,300 mg by mouth daily as needed for pain.    [name] Kitchen albuterol (PROVENTIL HFA;VENTOLIN HFA) 108 (90 Base) MCG/ACT inhaler Inhale 2 puffs into the lungs every 6 (six) hours as needed for wheezing or shortness of breath.    . allopurinol (ZYLOPRIM) 300 MG tablet Take 300 mg by mouth daily.    [name] Kitchen ALPRAZolam (XANAX) 0.25  MG tablet Take 0.125 mg by mouth daily as needed for anxiety.     [name] Kitchen aspirin EC 81 MG tablet Take 81 mg by mouth every morning.     [name] Kitchen buPROPion (WELLBUTRIN) 100 MG tablet Take 100 mg by mouth 2 (two) times daily.    . cholecalciferol (VITAMIN D) 1000 UNITS tablet Take 1,000 Units by mouth daily.    . colchicine 0.6 MG tablet Take 0.6 mg by mouth daily as needed (FLAREUPS).    . Cyanocobalamin (VITAMIN B-12) 2500 MCG SUBL Place 2,500 mcg under the tongue daily.    [name] Kitchen escitalopram (LEXAPRO) 20 MG tablet Take 20 mg by mouth every morning.     . fluticasone (FLONASE) 50 MCG/ACT nasal spray Place 1 spray into both nostrils daily as needed for allergies or rhinitis.    . fluticasone furoate-vilanterol (BREO ELLIPTA) 200-25 MCG/INH AEPB Inhale 1 puff into the lungs daily.    . furosemide (LASIX) 40 MG tablet Take 40 mg (1 tab) in AM, then 20 mg (0.5 tab) in PM 45 tablet 3  . HYDROcodone-acetaminophen (NORCO/VICODIN) 5-325 MG tablet Take 1 tablet by mouth every 4 (four) hours as needed for moderate pain or severe pain. 10 tablet 0  . levalbuterol (XOPENEX) 0.63 MG/3ML nebulizer solution Take 0.63 mg by nebulization every 4 (four) hours as needed for wheezing or shortness of breath.    . levocetirizine (XYZAL) 5 MG tablet Take 5 mg by mouth every evening.    . metoprolol succinate (TOPROL-XL) 25 MG 24 hr tablet Take 25mg  (1 tablet) in the morning and 50mg  (2 tablets) in the evening. 90 tablet 6  . omeprazole (PRILOSEC) 20 MG capsule Take 20 mg by mouth daily.    [name] Kitchen Propylene Glycol (SYSTANE BALANCE) 0.6 % SOLN Place 1 drop into both eyes 2 (two) times daily as needed (DRY EYES).     . sacubitril-valsartan (ENTRESTO) 24-26 MG Take 1 tablet by mouth 2 (two) times daily. 180 tablet 3  . simvastatin (ZOCOR) 20 MG tablet TAKE 1 TABLET AT BEDTIME 30 tablet 3  . spironolactone (ALDACTONE) 25 MG tablet Take 25 mg by mouth daily.     No current facility-administered medications for this encounter.    BP 122/78    Pulse 81   Wt 291 lb 3.2 oz (132.1 kg)   LMP  (LMP Unknown)   SpO2 99%   BMI 42.39 kg/m    Wt Readings from Last 3 Encounters:  01/01/18 291 lb 3.2 oz (132.1 kg)  09/26/17 280 lb 6.4 oz (127.2 kg)  07/10/17 271 lb 6.4 oz (123.1 kg)   General:Obese. No resp difficulty. HEENT: Normal Neck: Supple. JVP flat. Carotids 2+ bilat; no bruits. No thyromegaly or nodule noted. Cor: PMI nondisplaced. RRR, No M/G/R noted Lungs: CTAB, normal effort. Abdomen: obese, soft, non-tender, non-distended, no HSM. No bruits or masses. +BS  Extremities: No cyanosis, clubbing, or rash. R and LLE no edema.  Neuro: Alert & orientedx3, cranial nerves grossly intact. moves all 4 extremities w/o difficulty. Affect pleasant  Assessment/Plan:  1. Chronic systolic CHF: EF 40-98% on echo in 4/18, nonischemic cardiomyopathy.  She had severe mitral regurgitation and a rheumatic-appearing mitral valve.  It is hard to know which was the initial problem (cardiomyopathy or mitral regurgitation), and hard to know how related they are.  However, based on most recent echo, suspect that she has a nonischemic cardiomyopathy from some other etiology like myocarditis and has a co-existing rheumatic mitral valve with perhaps worsening of MR from annular dilatation (secondary MR). Followup TEE in 8/18 showed EF 30-35% with dyssynchrony but MR only moderate.  RHC showed low filling pressures.  Now s/p [employer] CRT-D.   - Echo 09/2017 with EF up to 55-60% and MR was only mild.  - NYHA class III - Volume status stable on exam and on heart logic interrogation. Weight is up 11 lbs, but she admits to stress eating and inactivity.  - We discussed how there is not very good evidence regarding stopping cardiomyopathy medications after normalization of EF. Continue current Toprol XL, Entresto, and spironolactone.   - Continue Lasix 40 qam/20 qpm.    - BMET today  - Discussed importance of limiting fluid and salt intake, taking  medications, and daily weights.  2. Rheumatic heart disease: Severe MR on 4/18 TEE with a rheumatic-appearing valve, also moderate TR.  There were 2 jets of MR from 2 defined areas of malcoaptation.  As above, I suspect there was a co-existing nonischemic cardiomyopathy with a component of functional MR from annular dilatation.   - Repeat TEE in 8/18 showed moderate MR, and echo 09/2017 with normalization of EF showed only mild MR.  It appears that most of the MR was functional.  3. LBBB: s/p CRT device placement. No change  4. Lung nodule: Repeat CT ordered through Dr Orvan July office.  5. Palpitations: No Afib on device interrogation 6. Obesity: I strongly recommended that she work on diet/exercise for weight loss.  - Discussed working on portion control and exercise for weight loss 7. Depression: Significant. Continue Wellbutrin - Discussed exercising to help with depression and anxiety  Keep follow up with Dr Shirlee Latch in 3 months BMET today  Alford Highland 01/01/2018  Greater than 50% of the 25 minute visit was spent in counseling/coordination of care regarding disease state education, salt/fluid restriction, sliding scale diuretics, and medication compliance.

## 2018-01-09 ENCOUNTER — Ambulatory Visit (INDEPENDENT_AMBULATORY_CARE_PROVIDER_SITE_OTHER): Payer: Medicare Other | Admitting: *Deleted

## 2018-01-09 DIAGNOSIS — I428 Other cardiomyopathies: Secondary | ICD-10-CM | POA: Diagnosis not present

## 2018-01-09 NOTE — Progress Notes (Signed)
Remote ICD transmission.   

## 2018-01-11 LAB — CUP PACEART REMOTE DEVICE CHECK
Date Time Interrogation Session: 20190530205027
Implantable Lead Implant Date: 20180823
Implantable Lead Implant Date: 20180823
Implantable Lead Location: 753858
Implantable Lead Location: 753859
Implantable Lead Location: 753860
Implantable Lead Model: 4674
Implantable Lead Serial Number: 609786
Implantable Lead Serial Number: 872654
Lead Channel Setting Pacing Amplitude: 2.5 V
Lead Channel Setting Pacing Pulse Width: 0.8 ms
Lead Channel Setting Sensing Sensitivity: 0.5 mV
MDC IDC LEAD IMPLANT DT: 20180823
MDC IDC LEAD SERIAL: 435016
MDC IDC PG IMPLANT DT: 20180823
MDC IDC SET LEADCHNL LV PACING AMPLITUDE: 2.3 V
MDC IDC SET LEADCHNL LV SENSING SENSITIVITY: 1 mV
MDC IDC SET LEADCHNL RA PACING AMPLITUDE: 2 V
MDC IDC SET LEADCHNL RV PACING PULSEWIDTH: 0.4 ms
Pulse Gen Serial Number: 186593

## 2018-01-12 ENCOUNTER — Encounter: Payer: Self-pay | Admitting: Cardiology

## 2018-01-25 ENCOUNTER — Other Ambulatory Visit: Payer: Self-pay | Admitting: Internal Medicine

## 2018-02-19 ENCOUNTER — Other Ambulatory Visit (HOSPITAL_COMMUNITY): Payer: Self-pay | Admitting: Cardiology

## 2018-03-15 ENCOUNTER — Other Ambulatory Visit (HOSPITAL_COMMUNITY): Payer: Self-pay | Admitting: Cardiology

## 2018-03-20 ENCOUNTER — Ambulatory Visit (HOSPITAL_COMMUNITY)
Admission: RE | Admit: 2018-03-20 | Discharge: 2018-03-20 | Disposition: A | Discharge status: Home / Self Care | Payer: Medicare Other | Source: Ambulatory Visit | Attending: Cardiology | Admitting: Cardiology

## 2018-03-20 VITALS — BP 143/60 | HR 69 | Wt 300.0 lb

## 2018-03-20 DIAGNOSIS — F419 Anxiety disorder, unspecified: Secondary | ICD-10-CM | POA: Insufficient documentation

## 2018-03-20 DIAGNOSIS — Z6841 Body Mass Index (BMI) 40.0 and over, adult: Secondary | ICD-10-CM | POA: Insufficient documentation

## 2018-03-20 DIAGNOSIS — E669 Obesity, unspecified: Secondary | ICD-10-CM | POA: Insufficient documentation

## 2018-03-20 DIAGNOSIS — Z7982 Long term (current) use of aspirin: Secondary | ICD-10-CM | POA: Insufficient documentation

## 2018-03-20 DIAGNOSIS — G4700 Insomnia, unspecified: Secondary | ICD-10-CM | POA: Diagnosis not present

## 2018-03-20 DIAGNOSIS — I428 Other cardiomyopathies: Secondary | ICD-10-CM | POA: Insufficient documentation

## 2018-03-20 DIAGNOSIS — Z79899 Other long term (current) drug therapy: Secondary | ICD-10-CM | POA: Diagnosis not present

## 2018-03-20 DIAGNOSIS — Z8249 Family history of ischemic heart disease and other diseases of the circulatory system: Secondary | ICD-10-CM | POA: Insufficient documentation

## 2018-03-20 DIAGNOSIS — I447 Left bundle-branch block, unspecified: Secondary | ICD-10-CM | POA: Diagnosis not present

## 2018-03-20 DIAGNOSIS — F329 Major depressive disorder, single episode, unspecified: Secondary | ICD-10-CM | POA: Insufficient documentation

## 2018-03-20 DIAGNOSIS — Z7951 Long term (current) use of inhaled steroids: Secondary | ICD-10-CM | POA: Insufficient documentation

## 2018-03-20 DIAGNOSIS — Z95810 Presence of automatic (implantable) cardiac defibrillator: Secondary | ICD-10-CM | POA: Diagnosis not present

## 2018-03-20 DIAGNOSIS — R911 Solitary pulmonary nodule: Secondary | ICD-10-CM | POA: Insufficient documentation

## 2018-03-20 DIAGNOSIS — R002 Palpitations: Secondary | ICD-10-CM | POA: Insufficient documentation

## 2018-03-20 DIAGNOSIS — I5022 Chronic systolic (congestive) heart failure: Secondary | ICD-10-CM | POA: Diagnosis not present

## 2018-03-20 DIAGNOSIS — I051 Rheumatic mitral insufficiency: Secondary | ICD-10-CM | POA: Insufficient documentation

## 2018-03-20 DIAGNOSIS — G4733 Obstructive sleep apnea (adult) (pediatric): Secondary | ICD-10-CM | POA: Diagnosis not present

## 2018-03-20 LAB — BASIC METABOLIC PANEL
ANION GAP: 8 (ref 5–15)
BUN: 13 mg/dL (ref 8–23)
CALCIUM: 9.7 mg/dL (ref 8.9–10.3)
CO2: 27 mmol/L (ref 22–32)
Chloride: 106 mmol/L (ref 98–111)
Creatinine, Ser: 0.95 mg/dL (ref 0.44–1.00)
GFR calc Af Amer: >60 mL/min (ref >60)
GFR calc non Af Amer: >60 mL/min (ref >60)
GLUCOSE: 130 mg/dL — AB (ref 70–99)
Potassium: 4.1 mmol/L (ref 3.5–5.1)
Sodium: 141 mmol/L (ref 135–145)

## 2018-03-20 NOTE — Patient Instructions (Addendum)
Labs today (will call for abnormal results, otherwise no news is good news)  Please take Melatonin 5 mg Daily at bedtime.   Labs in 3 months (bmet)  Referral placed for you to see a [facility] Dietic[facility]  And to see Dr. Dalbert Garnet for the weight loss clinic.  These offices will contact you directly for initial appointments.  Please contact your PCP for referral for a Psychiatrists consult.   Follow up in 6 months.  Please call our clinic in January 2020 to schedule your appointment. [phone], Opt 3.

## 2018-03-20 NOTE — Progress Notes (Signed)
Advanced Heart Failure Clinic Note  PCP: Dr. Paulino Rily HF Cardiology: Dr. Elyn Aquas Margaret Vargas is a 67 y.o. female with a history of suspected rheumatic fever, nonischemic cardiomyopathy, and severe mitral regurgitation presents for followup of CHF and mitral regurgitation.  Patient had an episode in childhood that was thought to be rheumatic fever.  She had a murmur as a child, but never had an echocardiogram.  Starting last summer, she developed exertional dyspnea. This has been progressive since that time.  She never saw a cardiologist until she was admitted in 4/18 with worsening dyspnea and weight gain.  Weight increased about 35 lbs from last summer.   She was diuresed in the hospital and had an echo and a TEE, showing EF 25-30% with rheumatic-appearing mitral valve and severe mitral regurgitation.  She has been seen by Dr Barry Dienes for evaluation for surgical repair/replacement of the MV.  Given improvement in symptoms, he suggested repeating TEE eventually to reassess.  Therefore, she had repeat RHC and TEE in 8/18 after medical therapy.  EF was 30-35% with septal-lateral dyssynchrony and there was only moderate functional MR.  RHC showed low filling pressures.  She had [employer] CRT-D placed in 8/18.   Echo 09/2017 shows EF up to 55-60% with mild MR.   Today she presents for regular follow up. Overall doing okay. Main complaints are depression, inactivity, and insomnia. Denies SOB, but has not been active. Says she can walk 1 mile on flat road. Cannot do hills or stairs. Has mild dizziness with standing. Denies orthopnea, PND, or edema. Denies CP. Compliant with medications, but has not taken this am. She also recently realized she has been taking Wellbutrin once daily instead of prescribed BID. Eating a lot of junk food. She is stress eating. In the middle of selling her house and moving to [place] further away from her friends. Weight is up 40 lbs over the last year  [employer] device interrogation: Heart Logic score 3 (suggesting stable volume). Personally reviewed.   Labs (4/18): BNP 698, K 4.3, creatinine 1.13 => 0.94, BNP 599 Labs (5/18): BNP 206, K 4.2, creatinine 0.87 Labs (7/18): digoxin 0.4, K 3.7, creatinine 0.98 Labs (8/18): K 4.2, creatinine 0.75, hgb 14.1 Labs (1/19): K 4.3, creatinine 0.96 Labs (2/19): K 3.7, creatinine 0.94 Labs (5/19): K 3.8, creatinine 0.91  PMH: 1. OSA 2. LBBB 3. h/o C difficile 4. Rheumatic fever as a child with possible rheumatic heart disease and severe MR.  - Echo (4/18): EF 25-30%, mild RV dilation with mildly decreased RV systolic function, moderate TR, thickened MV leaflets with 2 MR jets (2 defined areas of malcoaptation), severe MR.   - TEE (4/18): EF 25-30%, mild LV dilation, confirms severe MR with the above features.  - TEE (8/18): EF 30-35%, septal-lateral dyssynchrony, moderate MR (likely functional).  - Echo (1/19): EF 55-60%, mild MR.  5. Chronic systolic CHF: Nonischemic, prior myocarditis + valvular, versus all valvular.  - RHC/LHC (4/18): No significant CAD, severe MR.  Mean RA 18, PA 61/29, mean PCWP 30, CI 1.89.  - EF 30-35% on TEE 8/18.  - RHC (8/18): mean RA 1, PA 32/8, mean PCWP 6, CI 1.83 - [employer] CRT-D placed 8/18.  - Echo (2/19): EF 55-60%, mild MR 6. Anxiety/depression 7. Lung nodule by CT   SH: Married, lives in [place], retired Airline pilot, daughter lives in [place], nonsmoker, no ETOH.   FH: Mother with sudden cardiac death at 50.   Review of systems complete  and found to be negative unless listed in HPI.   Current Outpatient Medications  Medication Sig Dispense Refill  . acetaminophen (TYLENOL) 650 MG CR tablet Take 1,300 mg by mouth daily as needed for pain.    [name] Kitchen albuterol (PROVENTIL HFA;VENTOLIN HFA) 108 (90 Base) MCG/ACT inhaler Inhale 2 puffs into the lungs every 6 (six) hours as needed for wheezing or shortness of breath.    . allopurinol (ZYLOPRIM) 300 MG  tablet Take 300 mg by mouth daily.    [name] Kitchen ALPRAZolam (XANAX) 0.25 MG tablet Take 0.125 mg by mouth daily as needed for anxiety.     [name] Kitchen aspirin EC 81 MG tablet Take 81 mg by mouth every morning.     [name] Kitchen buPROPion (WELLBUTRIN) 100 MG tablet Take 100 mg by mouth 2 (two) times daily.    . cholecalciferol (VITAMIN D) 1000 UNITS tablet Take 1,000 Units by mouth daily.    . colchicine 0.6 MG tablet Take 0.6 mg by mouth daily as needed (FLAREUPS).    . Cyanocobalamin (VITAMIN B-12) 2500 MCG SUBL Place 2,500 mcg under the tongue daily.    [name] Kitchen escitalopram (LEXAPRO) 20 MG tablet Take 20 mg by mouth every morning.     . fluticasone (FLONASE) 50 MCG/ACT nasal spray Place 1 spray into both nostrils daily as needed for allergies or rhinitis.    . fluticasone furoate-vilanterol (BREO ELLIPTA) 200-25 MCG/INH AEPB Inhale 1 puff into the lungs daily.    . furosemide (LASIX) 40 MG tablet TAKE 1 TABLET IN THE MORNING AND 1/2 TABLET IN THE EVENING 135 tablet 1  . levalbuterol (XOPENEX) 0.63 MG/3ML nebulizer solution Take 0.63 mg by nebulization every 4 (four) hours as needed for wheezing or shortness of breath.    . levocetirizine (XYZAL) 5 MG tablet Take 5 mg by mouth every evening.    . metoprolol succinate (TOPROL-XL) 25 MG 24 hr tablet TAKE 1 TABLET IN THE MORNING AND 2 TABLETS IN THE EVENING 90 tablet 6  . omeprazole (PRILOSEC) 20 MG capsule Take 20 mg by mouth daily.    . sacubitril-valsartan (ENTRESTO) 24-26 MG Take 1 tablet by mouth 2 (two) times daily. 180 tablet 3  . simvastatin (ZOCOR) 20 MG tablet TAKE 1 TABLET AT BEDTIME 30 tablet 3  . spironolactone (ALDACTONE) 25 MG tablet Take 1 tablet (25 mg total) by mouth at bedtime. 90 tablet 3   No current facility-administered medications for this encounter.    BP (!) 143/60   Pulse 69   Wt 300 lb (136.1 kg)   LMP  (LMP Unknown)   SpO2 98%   BMI 43.67 kg/m    Wt Readings from Last 3 Encounters:  03/20/18 300 lb (136.1 kg)  01/01/18 291 lb 3.2 oz (132.1 kg)    09/26/17 280 lb 6.4 oz (127.2 kg)   General: Obese. No resp difficulty. HEENT: Normal Neck: Supple. JVP flat. Carotids 2+ bilat; no bruits. No thyromegaly or nodule noted. Cor: PMI nondisplaced. RRR, No M/G/R noted Lungs: CTAB, normal effort. Abdomen: Soft, non-tender, non-distended, no HSM. No bruits or masses. +BS  Extremities: No cyanosis, clubbing, or rash. R and LLE no edema.  Neuro: Alert & orientedx3, cranial nerves grossly intact. moves all 4 extremities w/o difficulty. Affect pleasant   Assessment/Plan: 1. Chronic systolic CHF: EF 16-10% on echo in 4/18, nonischemic cardiomyopathy.  She had severe mitral regurgitation and a rheumatic-appearing mitral valve.  It is hard to know which was the initial problem (cardiomyopathy or mitral regurgitation), and hard to know  how related they are.  However, based on most recent echo, suspect that she has a nonischemic cardiomyopathy from some other etiology like myocarditis and has a co-existing rheumatic mitral valve with perhaps worsening of MR from annular dilatation (secondary MR). Followup TEE in 8/18 showed EF 30-35% with dyssynchrony but MR only moderate.  RHC showed low filling pressures.  Now s/p [employer] CRT-D.   - Echo 09/2017 with EF up to 55-60% and MR was only mild.  - NYHA class III - Volume status stable on exam and on heart logic interrogation. Weight is up, but does not seem to be fluid. - We discussed how there is not very good evidence regarding stopping cardiomyopathy medications after normalization of EF.  - Continue Lasix 40 qam/20 qpm.    - Continue spiro 25 mg daily - Continue Entresto 24/26 mg BID - Continue Toprol 25 mg am, 50 mg pm - Discussed importance of limiting fluid and salt intake, taking medications, and daily weights.  2. Rheumatic heart disease: Severe MR on 4/18 TEE with a rheumatic-appearing valve, also moderate TR.  There were 2 jets of MR from 2 defined areas of malcoaptation.  As above, I  suspect there was a co-existing nonischemic cardiomyopathy with a component of functional MR from annular dilatation.   - Repeat TEE in 8/18 showed moderate MR, and echo 09/2017 with normalization of EF showed only mild MR.  It appears that most of the MR was functional. No change.  3. LBBB: s/p CRT device placement. No change.  4. Lung nodule: CTA chest 12/2016: 17 mm well-circumscribed round nodule in RUL. 3-6 month repeat CT's recommended.  Looks like she has canceled several appointments for repeat CT scans. Dr Cornelius Moras following. 5. Palpitations: No Afib on device interrogation. No change.  6. Obesity: Up 40 lbs over 1 year - Encouraged exercise and dieting - Referred to dietician and weight loss clinic today. A big part of the problem is secondary to depression.  7. Depression: Significant, worse currently. Continue Wellbutrin and lexapro - Encouraged her to start walking each morning again - Instructed her to call PCP today to discuss psychiatry referral  8. Insomnia - Encouraged her to try 5 mg melatonin. She does not like [place]. Discussed possibility of trazadone, but would like psychiatrist input with having her on lexapro and wellbutrin as well. She will follow up with her PCP to get in with psychiatry.   Refer to dietician Refer to weight loss clinic BMET today BMET 3 months Follow up in 6 months She needs to get referral for psychiatry from PCP  Alford Highland, NP 03/20/2018  Patient seen with NP, agree with the above note.  Echo in 2/19 showed improvement in EF to 55-60%.  On exam today and by [employer], she does not appear volume overloaded.  However, weight has been steadily rising.   She is tearful today.  I think there is a significant contribution here from depression.  She has a lot of stressors currently.  She did not admit to any thoughts of hurting herself.  - I think she needs a psychiatrist to help with her depression, I asked her to contact her PCP for this referral.    - I will refer her to the weight management program with Dr. Dalbert Garnet and to a nutritionist.  - We discussed diet/exercise to help with weight loss.   Continue current cardiac regimen. BMET today and again in 3 months.  Followup in 6 months.   Margaret Vargas  Margaret Vargas 03/21/2018

## 2018-03-27 ENCOUNTER — Other Ambulatory Visit (HOSPITAL_COMMUNITY): Payer: Self-pay | Admitting: Cardiology

## 2018-03-27 MED ORDER — SIMVASTATIN 20 MG PO TABS: 20.0000 mg | ORAL_TABLET | Freq: Every day | ORAL | 1 refills | Status: DC

## 2018-04-10 ENCOUNTER — Ambulatory Visit (INDEPENDENT_AMBULATORY_CARE_PROVIDER_SITE_OTHER): Payer: Medicare Other | Admitting: *Deleted

## 2018-04-10 DIAGNOSIS — I428 Other cardiomyopathies: Secondary | ICD-10-CM

## 2018-04-10 NOTE — Progress Notes (Signed)
Remote ICD transmission.   

## 2018-04-12 ENCOUNTER — Encounter: Payer: Self-pay | Admitting: Cardiology

## 2018-05-04 LAB — CUP PACEART REMOTE DEVICE CHECK
Implantable Lead Implant Date: 20180823
Implantable Lead Implant Date: 20180823
Implantable Lead Location: 753858
Implantable Lead Model: 7741
Implantable Lead Serial Number: 435016
Implantable Pulse Generator Implant Date: 20180823
MDC IDC LEAD IMPLANT DT: 20180823
MDC IDC LEAD LOCATION: 753859
MDC IDC LEAD LOCATION: 753860
MDC IDC LEAD SERIAL: 609786
MDC IDC LEAD SERIAL: 872654
MDC IDC SESS DTM: 20190920124706
Pulse Gen Serial Number: 186593

## 2018-05-17 ENCOUNTER — Encounter (INDEPENDENT_AMBULATORY_CARE_PROVIDER_SITE_OTHER): Payer: Self-pay

## 2018-05-22 ENCOUNTER — Ambulatory Visit (INDEPENDENT_AMBULATORY_CARE_PROVIDER_SITE_OTHER): Payer: Self-pay | Admitting: Family Medicine

## 2018-05-22 ENCOUNTER — Encounter (INDEPENDENT_AMBULATORY_CARE_PROVIDER_SITE_OTHER): Payer: Self-pay

## 2018-06-20 ENCOUNTER — Other Ambulatory Visit (HOSPITAL_COMMUNITY): Payer: Medicare Other

## 2018-06-27 ENCOUNTER — Other Ambulatory Visit (HOSPITAL_COMMUNITY): Payer: Medicare Other

## 2018-07-10 ENCOUNTER — Ambulatory Visit (INDEPENDENT_AMBULATORY_CARE_PROVIDER_SITE_OTHER): Payer: Medicare Other

## 2018-07-10 DIAGNOSIS — I5022 Chronic systolic (congestive) heart failure: Secondary | ICD-10-CM

## 2018-07-10 DIAGNOSIS — I428 Other cardiomyopathies: Secondary | ICD-10-CM

## 2018-07-10 NOTE — Progress Notes (Signed)
Remote ICD transmission.   

## 2018-08-30 ENCOUNTER — Other Ambulatory Visit (HOSPITAL_COMMUNITY): Payer: Self-pay | Admitting: Cardiology

## 2018-08-30 DIAGNOSIS — H2513 Age-related nuclear cataract, bilateral: Secondary | ICD-10-CM | POA: Diagnosis not present

## 2018-08-31 ENCOUNTER — Other Ambulatory Visit (HOSPITAL_COMMUNITY): Payer: Self-pay

## 2018-08-31 LAB — CUP PACEART REMOTE DEVICE CHECK
Implantable Lead Implant Date: 20180823
Implantable Lead Implant Date: 20180823
Implantable Lead Location: 753858
Implantable Lead Location: 753859
Implantable Lead Location: 753860
Implantable Lead Model: 4674
Implantable Lead Model: 7741
Implantable Lead Serial Number: 435016
Implantable Lead Serial Number: 872654
MDC IDC LEAD IMPLANT DT: 20180823
MDC IDC LEAD SERIAL: 609786
MDC IDC PG IMPLANT DT: 20180823
MDC IDC PG SERIAL: 186593
MDC IDC SESS DTM: 20200117121027

## 2018-08-31 MED ORDER — METOPROLOL SUCCINATE ER 25 MG PO TB24: ORAL_TABLET | ORAL | 6 refills | Status: DC

## 2018-09-04 DIAGNOSIS — J209 Acute bronchitis, unspecified: Secondary | ICD-10-CM | POA: Diagnosis not present

## 2018-09-05 ENCOUNTER — Other Ambulatory Visit (HOSPITAL_COMMUNITY): Payer: Self-pay | Admitting: Cardiology

## 2018-10-02 DIAGNOSIS — E1122 Type 2 diabetes mellitus with diabetic chronic kidney disease: Secondary | ICD-10-CM | POA: Diagnosis not present

## 2018-10-02 DIAGNOSIS — G4700 Insomnia, unspecified: Secondary | ICD-10-CM | POA: Diagnosis not present

## 2018-10-02 DIAGNOSIS — Z79899 Other long term (current) drug therapy: Secondary | ICD-10-CM | POA: Diagnosis not present

## 2018-10-02 DIAGNOSIS — Z0000 Encounter for general adult medical examination without abnormal findings: Secondary | ICD-10-CM | POA: Diagnosis not present

## 2018-10-02 DIAGNOSIS — N182 Chronic kidney disease, stage 2 (mild): Secondary | ICD-10-CM | POA: Diagnosis not present

## 2018-10-02 DIAGNOSIS — E559 Vitamin D deficiency, unspecified: Secondary | ICD-10-CM | POA: Diagnosis not present

## 2018-10-02 DIAGNOSIS — I10 Essential (primary) hypertension: Secondary | ICD-10-CM | POA: Diagnosis not present

## 2018-10-02 DIAGNOSIS — E785 Hyperlipidemia, unspecified: Secondary | ICD-10-CM | POA: Diagnosis not present

## 2018-10-02 DIAGNOSIS — R69 Illness, unspecified: Secondary | ICD-10-CM | POA: Diagnosis not present

## 2018-10-02 DIAGNOSIS — Z1211 Encounter for screening for malignant neoplasm of colon: Secondary | ICD-10-CM | POA: Diagnosis not present

## 2018-10-09 ENCOUNTER — Ambulatory Visit (INDEPENDENT_AMBULATORY_CARE_PROVIDER_SITE_OTHER): Payer: Medicare HMO | Admitting: *Deleted

## 2018-10-09 DIAGNOSIS — I5022 Chronic systolic (congestive) heart failure: Secondary | ICD-10-CM

## 2018-10-09 DIAGNOSIS — I428 Other cardiomyopathies: Secondary | ICD-10-CM | POA: Diagnosis not present

## 2018-10-11 LAB — CUP PACEART REMOTE DEVICE CHECK
Battery Remaining Longevity: 132 mo
Battery Remaining Percentage: 100 %
HighPow Impedance: 90 Ohm
Implantable Lead Implant Date: 20180823
Implantable Lead Implant Date: 20180823
Implantable Lead Location: 753859
Implantable Lead Location: 753860
Implantable Lead Model: 293
Implantable Lead Model: 4674
Implantable Lead Model: 7741
Implantable Lead Serial Number: 435016
Implantable Pulse Generator Implant Date: 20180823
Lead Channel Impedance Value: 614 Ohm
Lead Channel Impedance Value: 688 Ohm
Lead Channel Setting Pacing Amplitude: 2 V
Lead Channel Setting Pacing Amplitude: 2.5 V
MDC IDC LEAD IMPLANT DT: 20180823
MDC IDC LEAD LOCATION: 753858
MDC IDC LEAD SERIAL: 609786
MDC IDC LEAD SERIAL: 872654
MDC IDC MSMT LEADCHNL RV IMPEDANCE VALUE: 419 Ohm
MDC IDC SESS DTM: 20200225055000
MDC IDC SET LEADCHNL LV PACING AMPLITUDE: 2.3 V
MDC IDC SET LEADCHNL LV PACING PULSEWIDTH: 0.8 ms
MDC IDC SET LEADCHNL LV SENSING SENSITIVITY: 1 mV
MDC IDC SET LEADCHNL RV PACING PULSEWIDTH: 0.4 ms
MDC IDC SET LEADCHNL RV SENSING SENSITIVITY: 0.5 mV
MDC IDC STAT BRADY RA PERCENT PACED: 0 %
MDC IDC STAT BRADY RV PERCENT PACED: 0 %
Pulse Gen Serial Number: 186593

## 2018-10-16 NOTE — Progress Notes (Signed)
Remote ICD transmission.   

## 2018-11-12 DIAGNOSIS — H1030 Unspecified acute conjunctivitis, unspecified eye: Secondary | ICD-10-CM | POA: Diagnosis not present

## 2018-11-12 DIAGNOSIS — H1010 Acute atopic conjunctivitis, unspecified eye: Secondary | ICD-10-CM | POA: Diagnosis not present

## 2018-11-12 DIAGNOSIS — R69 Illness, unspecified: Secondary | ICD-10-CM | POA: Diagnosis not present

## 2018-11-28 ENCOUNTER — Other Ambulatory Visit (HOSPITAL_COMMUNITY): Payer: Self-pay

## 2018-11-28 MED ORDER — FUROSEMIDE 40 MG PO TABS
ORAL_TABLET | ORAL | 3 refills | Status: DC
Start: 2018-11-28 — End: 2019-10-31

## 2018-11-28 NOTE — Telephone Encounter (Signed)
Received refill request for furosemide. Refilled the appropriate dose.

## 2018-12-18 ENCOUNTER — Ambulatory Visit (HOSPITAL_COMMUNITY)
Admission: RE | Admit: 2018-12-18 | Discharge: 2018-12-18 | Disposition: A | Discharge status: Home / Self Care | Payer: Medicare HMO | Source: Ambulatory Visit | Attending: Cardiology | Admitting: Cardiology

## 2018-12-18 ENCOUNTER — Other Ambulatory Visit: Payer: Self-pay

## 2018-12-18 VITALS — Wt 303.0 lb

## 2018-12-18 DIAGNOSIS — I5022 Chronic systolic (congestive) heart failure: Secondary | ICD-10-CM | POA: Diagnosis not present

## 2018-12-18 DIAGNOSIS — I428 Other cardiomyopathies: Secondary | ICD-10-CM

## 2018-12-18 NOTE — Progress Notes (Signed)
Heart Failure TeleHealth Note  Due to national recommendations of social distancing due to COVID 19, Audio/video telehealth visit is felt to be most appropriate for this patient at this time.  See MyChart message from today for patient consent regarding telehealth for [facility].  Date:  12/18/2018   ID:  Margaret Vargas, DOB Jan 03, 1951, MRN [id number]  Location: Home  Provider location: [facility] Type of Visit: Established patient   PCP:  Mila Palmer, MD  Cardiologist:  No primary care provider on file. Primary HF: Dr Shirlee Latch   Chief Complaint: Heart Failure   History of Present Illness: Margaret Vargas is a 68 y.o. female with a history of suspected rheumatic fever, nonischemic cardiomyopathy, and severe mitral regurgitation presents for followup of CHF and mitral regurgitation.  Patient had an episode in childhood that was thought to be rheumatic fever.  She had a murmur as a child, but never had an echocardiogram.  Starting last summer, she developed exertional dyspnea. This has been progressive since that time.  She never saw a cardiologist until she was admitted in 4/18 with worsening dyspnea and weight gain.  Weight increased about 35 lbs from last summer.   She was diuresed in the hospital and had an echo and a TEE, showing EF 25-30% with rheumatic-appearing mitral valve and severe mitral regurgitation.  She has been seen by Dr Barry Dienes for evaluation for surgical repair/replacement of the MV.  Given improvement in symptoms, he suggested repeating TEE eventually to reassess.  Therefore, she had repeat RHC and TEE in 8/18 after medical therapy.  EF was 30-35% with septal-lateral dyssynchrony and there was only moderate functional MR.  RHC showed low filling pressures.  She had [employer] CRT-D placed in 8/18.   Echo 09/2017 shows EF up to 55-60% with mild MR.   She  presents via Special educational needs teacher for a telehealth visit today.   Overall  feeling just ok. Trying to get over recent cold. Having ongoing cough. She saw her PCP and she thought it was related to allergies.  Complaining of depression. Mild dyspnea with steps. Denies SOB/PND/Orthopnea. Appetite tremendous.No fever or chills. Weight at home 303 pounds. Taking all medications. Lives with her husband.     she denies symptoms worrisome for COVID 19.   Past Medical History:  Diagnosis Date  . Acute CHF (congestive heart failure) (HCC) 11/16/2016   [name] 11/16/2016  . AICD (automatic cardioverter/defibrillator) present 04/06/2017  . Anxiety    [name] 11/16/2016  . Arthritis    "neck" (11/16/2016)  . Asthma   . Chronic bronchitis (HCC)   . Chronic neck pain   . Depression    [name] 11/16/2016  . Gout   . 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 Heart murmur    "when I was a child"  . High cholesterol   . History of blood transfusion    "w/a back OR"  . Hypertension    [name] 11/16/2016  . Incidental lung nodule, greater than or equal to 6mm 01/02/2017   Probable granuloma right upper lobe - needs f/u imaging  . Pre-diabetes   . Sleep apnea    "need a mask/tests" (11/16/2016)  . Stroke [facility]) 05/2008   "mini stroke"; denies residual on 11/16/2016  . Thyroid nodule   . TIA (transient ischemic attack) "several"   Past Surgical History:  Procedure Laterality Date  . BACK SURGERY    . BIV ICD INSERTION CRT-D N/A 04/06/2017  Procedure: BIV ICD INSERTION CRT-D;  Surgeon: Marinus Maw, MD;  Location: [facility] INVASIVE CV LAB;  Service: Cardiovascular;  Laterality: N/A;  . INNER EAR SURGERY Right 1980s  . INSERTION OF ICD  04/06/2017   biv  . LAPAROSCOPIC CHOLECYSTECTOMY  1990s  . NEUROPLASTY / TRANSPOSITION MEDIAN NERVE AT CARPAL TUNNEL Left ~ 2010  . POSTERIOR FUSION LUMBAR SPINE  2005  . RIGHT HEART CATH N/A 03/27/2017   Procedure: RIGHT HEART CATH;  Surgeon: Laurey Morale, MD;  Location: [facility] INVASIVE CV LAB;  Service: Cardiovascular;  Laterality: N/A;  . RIGHT  OOPHORECTOMY Right 1990s  . RIGHT/LEFT HEART CATH AND CORONARY ANGIOGRAPHY N/A 11/21/2016   Procedure: Right/Left Heart Cath and Coronary Angiography;  Surgeon: Kathleene Hazel, MD;  Location: [facility] INVASIVE CV LAB;  Service: Cardiovascular;  Laterality: N/A;  . TEAR DUCT PROBING Bilateral 2000s  . TEE WITHOUT CARDIOVERSION N/A 11/22/2016   Procedure: TRANSESOPHAGEAL ECHOCARDIOGRAM (TEE);  Surgeon: Chrystie Nose, MD;  Location: [facility] ENDOSCOPY;  Service: Cardiovascular;  Laterality: N/A;  . TEE WITHOUT CARDIOVERSION N/A 03/29/2017   Procedure: TRANSESOPHAGEAL ECHOCARDIOGRAM (TEE);  Surgeon: Laurey Morale, MD;  Location: [facility] ENDOSCOPY;  Service: Cardiovascular;  Laterality: N/A;  . VAGINAL HYSTERECTOMY  1989     Current Outpatient Medications  Medication Sig Dispense Refill  . acetaminophen (TYLENOL) 650 MG CR tablet Take 1,300 mg by mouth daily as needed for pain.    [name] Kitchen albuterol (PROVENTIL HFA;VENTOLIN HFA) 108 (90 Base) MCG/ACT inhaler Inhale 2 puffs into the lungs every 6 (six) hours as needed for wheezing or shortness of breath.    . allopurinol (ZYLOPRIM) 300 MG tablet Take 300 mg by mouth daily.    [name] Kitchen ALPRAZolam (XANAX) 0.25 MG tablet Take 0.125 mg by mouth daily as needed for anxiety.     [name] Kitchen aspirin EC 81 MG tablet Take 81 mg by mouth every morning.     [name] Kitchen buPROPion (WELLBUTRIN) 100 MG tablet Take 150 mg by mouth 2 (two) times daily.     . cholecalciferol (VITAMIN D) 1000 UNITS tablet Take 1,000 Units by mouth daily.    . Cyanocobalamin (VITAMIN B-12) 2500 MCG SUBL Place 2,500 mcg under the tongue daily.    [name] Kitchen ENTRESTO 24-26 MG TAKE 1 TABLET TWICE A DAY (DISCONTINUE LOSARTAN) 180 tablet 3  . escitalopram (LEXAPRO) 20 MG tablet Take 20 mg by mouth every morning.     . fluticasone furoate-vilanterol (BREO ELLIPTA) 200-25 MCG/INH AEPB Inhale 1 puff into the lungs daily.    . furosemide (LASIX) 40 MG tablet TAKE 1 TABLET IN THE MORNING AND 1/2 TABLET IN THE EVENING 135 tablet 3  .  levalbuterol (XOPENEX) 0.63 MG/3ML nebulizer solution Take 0.63 mg by nebulization every 4 (four) hours as needed for wheezing or shortness of breath.    . levocetirizine (XYZAL) 5 MG tablet Take 5 mg by mouth every evening.    . metoprolol succinate (TOPROL-XL) 25 MG 24 hr tablet TAKE 1 TABLET IN THE MORNING AND 2 TABLETS IN THE EVENING 90 tablet 6  . omeprazole (PRILOSEC) 20 MG capsule Take 20 mg by mouth daily.    . simvastatin (ZOCOR) 20 MG tablet TAKE 1 TABLET AT BEDTIME 90 tablet 4  . spironolactone (ALDACTONE) 25 MG tablet Take 1 tablet (25 mg total) by mouth at bedtime. 90 tablet 3  . colchicine 0.6 MG tablet Take 0.6 mg by mouth daily as needed (FLAREUPS).    . fluticasone (FLONASE) 50 MCG/ACT nasal spray Place 1 spray  into both nostrils daily as needed for allergies or rhinitis.    [name] Kitchen. sacubitril-valsartan (ENTRESTO) 24-26 MG Take 1 tablet by mouth 2 (two) times daily. 180 tablet 3   No current facility-administered medications for this encounter.     Allergies:   Other; Adhesive [tape]; and Morphine and related   Social History:  The patient  reports that she quit smoking about 31 years ago. Her smoking use included cigarettes. She has a 9.75 pack-year smoking history. She has never used smokeless tobacco. She reports current alcohol use. She reports that she does not use drugs.   Family History:  The patient's family history includes Hypertension in her mother.   ROS:  Please see the history of present illness.   All other systems are personally reviewed and negative.   Exam: Tele Health Call; Exam is subjective  General:  Speaks in full sentences. No resp difficulty. Lungs: Normal respiratory effort with conversation.  Abdomen: Non-distended per patient report Extremities: Pt denies edema. Neuro: Alert & oriented x 3.   Recent Labs: 03/20/2018: BUN 13; Creatinine, Ser 0.95; Potassium 4.1; Sodium 141  Personally reviewed   Wt Readings from Last 3 Encounters:  12/18/18 (!)  137.4 kg (303 lb)  03/20/18 136.1 kg (300 lb)  01/01/18 132.1 kg (291 lb 3.2 oz)      ASSESSMENT AND PLAN:  1. Chronic systolic CHF: EF 78-29%25-30% on echo in 4/18, nonischemic cardiomyopathy.  She had severe mitral regurgitation and a rheumatic-appearing mitral valve.  It is hard to know which was the initial problem (cardiomyopathy or mitral regurgitation), and hard to know how related they are.  However, based on most recent echo, suspect that she has a nonischemic cardiomyopathy from some other etiology like myocarditis and has a co-existing rheumatic mitral valve with perhaps worsening of MR from annular dilatation (secondary MR). Followup TEE in 8/18 showed EF 30-35% with dyssynchrony but MR only moderate.  RHC showed low filling pressures.  Now s/p [employer] CRT-D.   - Echo 09/2017 with EF up to 55-60% and MR was only mild.  NYHA II-III. Likely limited by obesity. Volume status sounds stable. Continue Lasix 40 qam/20 qpm.    - Continue spiro 25 mg daily - Continue Entresto 24/26 mg BID - Continue Toprol 25 mg am, 50 mg pm 2. Rheumatic heart disease: Severe MR on 4/18 TEE with a rheumatic-appearing valve, also moderate TR.  There were 2 jets of MR from 2 defined areas of malcoaptation.  Per Dr Shirlee LatchMcLean there was a co-existing nonischemic cardiomyopathy with a component of functional MR from annular dilatation.   - Repeat TEE in 8/18 showed moderate MR, and echo 09/2017 with normalization of EF showed only mild MR.  It appears that most of the MR was functional. No change.  3. LBBB: s/p CRT device placement. No change.  4. Lung nodule: CTA chest 12/2016: 17 mm well-circumscribed round nodule in RUL. 3-6 month repeat CT's recommended.  Looks like she has canceled several appointments for repeat CT scans.  5. Obesity:  Body mass index is 44.1 kg/m.  Discussed portion control   7. Depression: Per PCP.  8. Insomnia - Encouraged her to try 5 mg melatonin. She does not like [place]. Discussed  possibility of trazadone, but would like psychiatrist input with having her on lexapro and wellbutrin as well. She will follow up with her PCP to get in with psychiatry.  COVID screen The patient does not have any symptoms that suggest any further testing/ screening  at this time.  Social distancing reinforced today.  Patient Risk: After full review of this patients clinical status, I feel that they are at moderate risk for cardiac decompensation at this time.  Relevant cardiac medications were reviewed at length with the patient today. The patient does not have concerns regarding their medications at this time.   The following changes were made today:  None Recommended follow-up:  Follow up in 6 months with Dr Shirlee Latch.   Today, I have spent 15  minutes with the patient with telehealth technology discussing the above issues .    Waneta Martins, NP  12/18/2018 1:24 PM  [facility] [address] Heart and Vascular [place] [address] [phone] (office) [phone] (fax)

## 2018-12-18 NOTE — Addendum Note (Signed)
Encounter addended by: Marisa Hua, RN on: 12/18/2018 2:19 PM  Actions taken: Clinical Note Signed

## 2018-12-18 NOTE — Progress Notes (Signed)
Spoke with patient, discussed AVS.  No questions endorsed. AVS mailed

## 2018-12-18 NOTE — Patient Instructions (Signed)
No medication changes today!  Your physician recommends that you schedule a follow-up appointment in: 6 months with Dr. Shirlee Latch, you will receive a call to schedule this appointment. Please call office if you do not get a call. [phone].

## 2018-12-27 ENCOUNTER — Other Ambulatory Visit (HOSPITAL_COMMUNITY): Payer: Self-pay | Admitting: Internal Medicine

## 2019-01-09 ENCOUNTER — Ambulatory Visit (INDEPENDENT_AMBULATORY_CARE_PROVIDER_SITE_OTHER): Payer: Medicare HMO | Admitting: *Deleted

## 2019-01-09 DIAGNOSIS — I428 Other cardiomyopathies: Secondary | ICD-10-CM | POA: Diagnosis not present

## 2019-01-10 ENCOUNTER — Telehealth: Payer: Self-pay

## 2019-01-10 NOTE — Telephone Encounter (Signed)
Left message for patient to remind of missed remote transmission.  

## 2019-01-16 LAB — CUP PACEART REMOTE DEVICE CHECK
Date Time Interrogation Session: 20200603081335
Implantable Lead Implant Date: 20180823
Implantable Lead Implant Date: 20180823
Implantable Lead Implant Date: 20180823
Implantable Lead Location: 753858
Implantable Lead Location: 753859
Implantable Lead Location: 753860
Implantable Lead Model: 293
Implantable Lead Model: 4674
Implantable Lead Model: 7741
Implantable Lead Serial Number: 435016
Implantable Lead Serial Number: 609786
Implantable Lead Serial Number: 872654
Implantable Pulse Generator Implant Date: 20180823
Pulse Gen Serial Number: 186593

## 2019-01-17 ENCOUNTER — Other Ambulatory Visit (HOSPITAL_COMMUNITY): Payer: Self-pay

## 2019-01-17 MED ORDER — METOPROLOL SUCCINATE ER 25 MG PO TB24: ORAL_TABLET | ORAL | 3 refills | Status: DC

## 2019-01-18 ENCOUNTER — Encounter: Payer: Self-pay | Admitting: Cardiology

## 2019-01-18 NOTE — Progress Notes (Signed)
Remote ICD transmission.   

## 2019-02-11 DIAGNOSIS — N3000 Acute cystitis without hematuria: Secondary | ICD-10-CM | POA: Diagnosis not present

## 2019-03-12 ENCOUNTER — Ambulatory Visit (INDEPENDENT_AMBULATORY_CARE_PROVIDER_SITE_OTHER): Payer: Medicare HMO

## 2019-03-12 ENCOUNTER — Other Ambulatory Visit: Payer: Self-pay | Admitting: Sports Medicine

## 2019-03-12 ENCOUNTER — Encounter: Payer: Self-pay | Admitting: Sports Medicine

## 2019-03-12 ENCOUNTER — Other Ambulatory Visit: Payer: Self-pay

## 2019-03-12 ENCOUNTER — Ambulatory Visit: Payer: Medicare HMO | Admitting: Sports Medicine

## 2019-03-12 VITALS — Temp 96.9°F

## 2019-03-12 DIAGNOSIS — M79672 Pain in left foot: Secondary | ICD-10-CM

## 2019-03-12 DIAGNOSIS — M7752 Other enthesopathy of left foot: Secondary | ICD-10-CM

## 2019-03-12 DIAGNOSIS — M2022 Hallux rigidus, left foot: Secondary | ICD-10-CM | POA: Diagnosis not present

## 2019-03-12 DIAGNOSIS — M778 Other enthesopathies, not elsewhere classified: Secondary | ICD-10-CM

## 2019-03-12 DIAGNOSIS — M779 Enthesopathy, unspecified: Secondary | ICD-10-CM | POA: Diagnosis not present

## 2019-03-12 MED ORDER — PREDNISONE 10 MG (21) PO TBPK: ORAL_TABLET | ORAL | 1 refills | Status: DC

## 2019-03-12 NOTE — Progress Notes (Signed)
Subjective: Margaret Vargas is a 68 y.o. female patient who presents to office for evaluation of left big toe joint and posterior heel  pain. Patient complains of progressive pain especially over the last year off and on at the big toe joint and the last month at the heel reports that she fell and bumped her heel and has had some soreness at the back on the left however has had problems with her big toe joint bothering her and reports that sometimes the pain is so bad in the big toe joint that it wakes her up at night admits to a personal and family history of gout and is wondering if this is what she had at the left big toe joint.  Patient has not tried anything for treatment. Patient denies any other pedal complaints. Denies any other causative factors.   Patient Active Problem List   Diagnosis Date Noted  . Chronic systolic (congestive) heart failure (HCC) 04/06/2017  . Incidental lung nodule, greater than or equal to 29mm 01/02/2017  . Chronic systolic heart failure (HCC) 11/30/2016  . Non-ischemic cardiomyopathy (HCC)   . NSVT (nonsustained ventricular tachycardia) (HCC)   . Mitral valve insufficiency   . Essential hypertension 11/16/2016  . Depression 11/16/2016  . SOB (shortness of breath)   . History of transient ischemic attack (TIA)     Current Outpatient Medications on File Prior to Visit  Medication Sig Dispense Refill  . acetaminophen (TYLENOL) 650 MG CR tablet Take 1,300 mg by mouth daily as needed for pain.    [name] Kitchen albuterol (PROVENTIL HFA;VENTOLIN HFA) 108 (90 Base) MCG/ACT inhaler Inhale 2 puffs into the lungs every 6 (six) hours as needed for wheezing or shortness of breath.    . allopurinol (ZYLOPRIM) 300 MG tablet Take 300 mg by mouth daily.    [name] Kitchen ALPRAZolam (XANAX) 0.25 MG tablet Take 0.125 mg by mouth daily as needed for anxiety.     [name] Kitchen aspirin EC 81 MG tablet Take 81 mg by mouth every morning.     [name] Kitchen azelastine (OPTIVAR) 0.05 % ophthalmic solution INSTILL 1 DROP INTO  AFFECTED EYE TWICE A DAY    . buPROPion (WELLBUTRIN XL) 150 MG 24 hr tablet TAKE 1 TABLET BY MOUTH EVERY DAY IN THE MORNING    . buPROPion (WELLBUTRIN) 100 MG tablet Take 150 mg by mouth 2 (two) times daily.     . cephALEXin (KEFLEX) 500 MG capsule TAKE 1 CAPSULE BY MOUTH TWICE A DAY FOR 5 DAYS    . cholecalciferol (VITAMIN D) 1000 UNITS tablet Take 1,000 Units by mouth daily.    . ciprofloxacin (CIPRO) 500 MG tablet TAKE 1 TABLET BY MOUTH EVERY 12 HOURS FOR 5 DAYS    . colchicine 0.6 MG tablet Take 0.6 mg by mouth daily as needed (FLAREUPS).    . Cyanocobalamin (VITAMIN B-12) 2500 MCG SUBL Place 2,500 mcg under the tongue daily.    [name] Kitchen ENTRESTO 24-26 MG TAKE 1 TABLET TWICE A DAY (DISCONTINUE LOSARTAN) 180 tablet 3  . escitalopram (LEXAPRO) 20 MG tablet Take 20 mg by mouth every morning.     . fluticasone (FLONASE) 50 MCG/ACT nasal spray Place 1 spray into both nostrils daily as needed for allergies or rhinitis.    . fluticasone furoate-vilanterol (BREO ELLIPTA) 200-25 MCG/INH AEPB Inhale 1 puff into the lungs daily.    . furosemide (LASIX) 40 MG tablet TAKE 1 TABLET IN THE MORNING AND 1/2 TABLET IN THE EVENING 135 tablet 3  . levalbuterol (XOPENEX) 0.63  MG/3ML nebulizer solution Take 0.63 mg by nebulization every 4 (four) hours as needed for wheezing or shortness of breath.    . levocetirizine (XYZAL) 5 MG tablet Take 5 mg by mouth every evening.    . metoprolol succinate (TOPROL-XL) 25 MG 24 hr tablet TAKE 1 TABLET IN THE MORNING AND 2 TABLETS IN THE EVENING 270 tablet 3  . omeprazole (PRILOSEC) 20 MG capsule Take 20 mg by mouth daily.    . sacubitril-valsartan (ENTRESTO) 24-26 MG Take 1 tablet by mouth 2 (two) times daily. 180 tablet 3  . simvastatin (ZOCOR) 20 MG tablet TAKE 1 TABLET AT BEDTIME 90 tablet 4  . spironolactone (ALDACTONE) 25 MG tablet TAKE 1 TABLET AT BEDTIME 90 tablet 3  . trimethoprim-polymyxin b (POLYTRIM) ophthalmic solution PLACE 1 DROP IN AFFECTED EYE EVERY 3 4 HOURS AS  NEEDED     No current facility-administered medications on file prior to visit.     Allergies  Allergen Reactions  . Other Other (See Comments)    Bandaids and some adhesives cause red skin. (pls use paper tape)  . Adhesive [Tape] Itching and Rash  . Morphine And Related Itching    Objective:  General: Alert and oriented x3 in no acute distress  Dermatology: No open lesions bilateral lower extremities, no webspace macerations, no ecchymosis bilateral, all nails x 10 are well manicured.  Vascular: Dorsalis Pedis and Posterior Tibial pedal pulses palpable, Capillary Fill Time 3 seconds,(+) pedal hair growth bilateral, no edema bilateral lower extremities, Temperature gradient within normal limits.  Neurology: Johney Maine sensation intact via light touch bilateral.  Musculoskeletal: Mild tenderness with palpation at left first metatarsophalangeal joint with end range of motion and significant limitation has approximately less than 30 degrees of dorsiflexion and less than 10 degrees of plantarflexion on left consistent with hallux rigidus with bunion left greater than right.  Patient also has pain with palpation to the Achilles at the watershed area on the left.  No pain with calf compression bilateral. There is decreased ankle rom with knee extending  vs flexed resembling gastroc equnius bilateral.  Strength within normal limits in all groups bilateral.   Gait: Antalgic gait  Xrays  Left foot   Impression: Significant joint space narrowing with dorsal bone spurring noted at the first metatarsophalangeal joint, there is mild inferior posterior and inferior calcaneal spurs present with some mild thickening of the plantar fascia, no other acute findings.  Assessment and Plan: Problem List Items Addressed This Visit    None    Visit Diagnoses    Pain in left foot    -  Primary   Relevant Orders   DG Foot Complete Left   Capsulitis       Hallux rigidus of left foot       Tendonitis of  ankle, left          -Complete examination performed -Xrays reviewed -Discussed treatement options for hallux rigidus and tendinitis at Achilles -Rx prednisone Dosepak to take as instructed -Dispensed heel lifts for patient to use to decrease any excessive strain to Achilles -Advised rest ice elevation and gentle stretching as needed for Achilles -Advised patient if her big toe joint continues to be painful may benefit from injection versus considering surgery -Patient to return to office if pain is still present after 4 weeks or sooner if condition worsens.  Landis Martins, DPM

## 2019-03-22 ENCOUNTER — Other Ambulatory Visit (HOSPITAL_COMMUNITY): Payer: Self-pay | Admitting: Cardiology

## 2019-04-03 DIAGNOSIS — K625 Hemorrhage of anus and rectum: Secondary | ICD-10-CM | POA: Diagnosis not present

## 2019-04-09 ENCOUNTER — Ambulatory Visit: Payer: Medicare HMO | Admitting: Sports Medicine

## 2019-04-10 ENCOUNTER — Ambulatory Visit (INDEPENDENT_AMBULATORY_CARE_PROVIDER_SITE_OTHER): Payer: Medicare HMO | Admitting: *Deleted

## 2019-04-10 DIAGNOSIS — I428 Other cardiomyopathies: Secondary | ICD-10-CM

## 2019-04-16 LAB — CUP PACEART REMOTE DEVICE CHECK
Date Time Interrogation Session: 20200901213006
Implantable Lead Implant Date: 20180823
Implantable Lead Implant Date: 20180823
Implantable Lead Implant Date: 20180823
Implantable Lead Location: 753858
Implantable Lead Location: 753859
Implantable Lead Location: 753860
Implantable Lead Model: 293
Implantable Lead Model: 4674
Implantable Lead Model: 7741
Implantable Lead Serial Number: 435016
Implantable Lead Serial Number: 609786
Implantable Lead Serial Number: 872654
Implantable Pulse Generator Implant Date: 20180823
Pulse Gen Serial Number: 186593

## 2019-04-23 ENCOUNTER — Encounter: Payer: Self-pay | Admitting: Cardiology

## 2019-04-23 NOTE — Progress Notes (Signed)
Remote ICD transmission.   

## 2019-05-10 DIAGNOSIS — Z23 Encounter for immunization: Secondary | ICD-10-CM | POA: Diagnosis not present

## 2019-06-06 DIAGNOSIS — H6123 Impacted cerumen, bilateral: Secondary | ICD-10-CM | POA: Diagnosis not present

## 2019-06-18 ENCOUNTER — Ambulatory Visit: Payer: Medicare HMO | Admitting: Sports Medicine

## 2019-07-10 ENCOUNTER — Ambulatory Visit (INDEPENDENT_AMBULATORY_CARE_PROVIDER_SITE_OTHER): Payer: Medicare HMO | Admitting: *Deleted

## 2019-07-10 DIAGNOSIS — I5022 Chronic systolic (congestive) heart failure: Secondary | ICD-10-CM

## 2019-07-15 LAB — CUP PACEART REMOTE DEVICE CHECK
Date Time Interrogation Session: 20201129124812
Implantable Lead Implant Date: 20180823
Implantable Lead Implant Date: 20180823
Implantable Lead Implant Date: 20180823
Implantable Lead Location: 753858
Implantable Lead Location: 753859
Implantable Lead Location: 753860
Implantable Lead Model: 293
Implantable Lead Model: 4674
Implantable Lead Model: 7741
Implantable Lead Serial Number: 435016
Implantable Lead Serial Number: 609786
Implantable Lead Serial Number: 872654
Implantable Pulse Generator Implant Date: 20180823
Pulse Gen Serial Number: 186593

## 2019-07-16 ENCOUNTER — Encounter (HOSPITAL_COMMUNITY): Payer: Self-pay

## 2019-07-16 ENCOUNTER — Encounter (HOSPITAL_COMMUNITY): Payer: Self-pay | Admitting: *Deleted

## 2019-07-16 ENCOUNTER — Other Ambulatory Visit: Payer: Self-pay

## 2019-07-16 ENCOUNTER — Ambulatory Visit (HOSPITAL_COMMUNITY)
Admission: RE | Admit: 2019-07-16 | Discharge: 2019-07-16 | Disposition: A | Discharge status: Home / Self Care | Payer: Medicare HMO | Source: Ambulatory Visit | Attending: Adult Health | Admitting: Adult Health

## 2019-07-16 VITALS — BP 164/83 | HR 68 | Wt 294.4 lb

## 2019-07-16 DIAGNOSIS — J449 Chronic obstructive pulmonary disease, unspecified: Secondary | ICD-10-CM | POA: Insufficient documentation

## 2019-07-16 DIAGNOSIS — R5383 Other fatigue: Secondary | ICD-10-CM | POA: Insufficient documentation

## 2019-07-16 DIAGNOSIS — Z885 Allergy status to narcotic agent status: Secondary | ICD-10-CM | POA: Insufficient documentation

## 2019-07-16 DIAGNOSIS — Z95810 Presence of automatic (implantable) cardiac defibrillator: Secondary | ICD-10-CM | POA: Insufficient documentation

## 2019-07-16 DIAGNOSIS — R69 Illness, unspecified: Secondary | ICD-10-CM | POA: Diagnosis not present

## 2019-07-16 DIAGNOSIS — I081 Rheumatic disorders of both mitral and tricuspid valves: Secondary | ICD-10-CM | POA: Insufficient documentation

## 2019-07-16 DIAGNOSIS — I110 Hypertensive heart disease with heart failure: Secondary | ICD-10-CM | POA: Insufficient documentation

## 2019-07-16 DIAGNOSIS — I10 Essential (primary) hypertension: Secondary | ICD-10-CM

## 2019-07-16 DIAGNOSIS — I428 Other cardiomyopathies: Secondary | ICD-10-CM | POA: Insufficient documentation

## 2019-07-16 DIAGNOSIS — M109 Gout, unspecified: Secondary | ICD-10-CM | POA: Insufficient documentation

## 2019-07-16 DIAGNOSIS — Z90721 Acquired absence of ovaries, unilateral: Secondary | ICD-10-CM | POA: Insufficient documentation

## 2019-07-16 DIAGNOSIS — E7800 Pure hypercholesterolemia, unspecified: Secondary | ICD-10-CM | POA: Diagnosis not present

## 2019-07-16 DIAGNOSIS — Z87891 Personal history of nicotine dependence: Secondary | ICD-10-CM | POA: Insufficient documentation

## 2019-07-16 DIAGNOSIS — Z8249 Family history of ischemic heart disease and other diseases of the circulatory system: Secondary | ICD-10-CM | POA: Insufficient documentation

## 2019-07-16 DIAGNOSIS — G4700 Insomnia, unspecified: Secondary | ICD-10-CM | POA: Insufficient documentation

## 2019-07-16 DIAGNOSIS — M1990 Unspecified osteoarthritis, unspecified site: Secondary | ICD-10-CM | POA: Insufficient documentation

## 2019-07-16 DIAGNOSIS — I5022 Chronic systolic (congestive) heart failure: Secondary | ICD-10-CM

## 2019-07-16 DIAGNOSIS — R0683 Snoring: Secondary | ICD-10-CM

## 2019-07-16 DIAGNOSIS — Z7982 Long term (current) use of aspirin: Secondary | ICD-10-CM | POA: Diagnosis not present

## 2019-07-16 DIAGNOSIS — R911 Solitary pulmonary nodule: Secondary | ICD-10-CM | POA: Diagnosis not present

## 2019-07-16 DIAGNOSIS — F329 Major depressive disorder, single episode, unspecified: Secondary | ICD-10-CM | POA: Diagnosis not present

## 2019-07-16 DIAGNOSIS — Z79899 Other long term (current) drug therapy: Secondary | ICD-10-CM | POA: Insufficient documentation

## 2019-07-16 DIAGNOSIS — E669 Obesity, unspecified: Secondary | ICD-10-CM | POA: Insufficient documentation

## 2019-07-16 DIAGNOSIS — R7303 Prediabetes: Secondary | ICD-10-CM | POA: Insufficient documentation

## 2019-07-16 DIAGNOSIS — I447 Left bundle-branch block, unspecified: Secondary | ICD-10-CM

## 2019-07-16 DIAGNOSIS — Z8673 Personal history of transient ischemic attack (TIA), and cerebral infarction without residual deficits: Secondary | ICD-10-CM | POA: Insufficient documentation

## 2019-07-16 DIAGNOSIS — Z7951 Long term (current) use of inhaled steroids: Secondary | ICD-10-CM | POA: Insufficient documentation

## 2019-07-16 DIAGNOSIS — F419 Anxiety disorder, unspecified: Secondary | ICD-10-CM | POA: Insufficient documentation

## 2019-07-16 DIAGNOSIS — Z6841 Body Mass Index (BMI) 40.0 and over, adult: Secondary | ICD-10-CM | POA: Diagnosis not present

## 2019-07-16 LAB — BASIC METABOLIC PANEL
Anion gap: 10 (ref 5–15)
BUN: 11 mg/dL (ref 8–23)
CO2: 24 mmol/L (ref 22–32)
Calcium: 9.5 mg/dL (ref 8.9–10.3)
Chloride: 104 mmol/L (ref 98–111)
Creatinine, Ser: 0.87 mg/dL (ref 0.44–1.00)
GFR calc Af Amer: >60 mL/min (ref >60)
GFR calc non Af Amer: >60 mL/min (ref >60)
Glucose, Bld: 274 mg/dL — ABNORMAL HIGH (ref 70–99)
Potassium: 3.9 mmol/L (ref 3.5–5.1)
Sodium: 138 mmol/L (ref 135–145)

## 2019-07-16 NOTE — Progress Notes (Signed)
Date:  07/16/2019   ID:  Margaret Vargas, DOB 04/02/1951, MRN [id number]  Location: Home  Provider location: [place] [facility] Type of Visit: Established patient   PCP:  Jonathon Jordan, MD  Cardiologist:  No primary care provider on file. Primary HF: Dr Aundra Dubin   Chief Complaint: Heart Failure   History of Present Illness: Margaret Vargas is a 68 y.o. female with a history of suspected rheumatic fever, nonischemic cardiomyopathy, and severe mitral regurgitation presents for followup of CHF and mitral regurgitation.  Patient had an episode in childhood that was thought to be rheumatic fever.  She had a murmur as a child, but never had an echocardiogram.  Starting last summer, she developed exertional dyspnea. This has been progressive since that time.  She never saw a cardiologist until she was admitted in 4/18 with worsening dyspnea and weight gain.  Weight increased about 35 lbs from last summer.   She was diuresed in the hospital and had an echo and a TEE, showing EF 25-30% with rheumatic-appearing mitral valve and severe mitral regurgitation.  She has been seen by Dr Ricard Dillon for evaluation for surgical repair/replacement of the MV.  Given improvement in symptoms, he suggested repeating TEE eventually to reassess.  Therefore, she had repeat RHC and TEE in 8/18 after medical therapy.  EF was 30-35% with septal-lateral dyssynchrony and there was only moderate functional MR.  RHC showed low filling pressures.  She had [employer] CRT-D placed in 8/18.   Echo 09/2017 shows EF up to 55-60% with mild MR.   Today she returns for HF follow up.Overall feeling ok. Frustrated caring for her husband. Ongoing issues with day time fatigue. Family says she snores. Mild SOB with exertion. Denies PND/Orthopnea. Appetite ok. No fever or chills. She has not been weighing at home. Says she didn't take her medications today. Lives with her husband.    she denies symptoms worrisome  for COVID 19.   Past Medical History:  Diagnosis Date  . Acute CHF (congestive heart failure) ([place]) 11/16/2016   Archie Endo 11/16/2016  . AICD (automatic cardioverter/defibrillator) present 04/06/2017  . Anxiety    Archie Endo 11/16/2016  . Arthritis    "neck" (11/16/2016)  . Asthma   . Chronic bronchitis ([place])   . Chronic neck pain   . Depression    Archie Endo 11/16/2016  . Gout   . 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 Heart murmur    "when I was a child"  . High cholesterol   . History of blood transfusion    "w/a back OR"  . Hypertension    Archie Endo 11/16/2016  . Incidental lung nodule, greater than or equal to 28mm 01/02/2017   Probable granuloma right upper lobe - needs f/u imaging  . Pre-diabetes   . Sleep apnea    "need a mask/tests" (11/16/2016)  . Stroke [facility]) 05/2008   "mini stroke"; denies residual on 11/16/2016  . Thyroid nodule   . TIA (transient ischemic attack) "several"   Past Surgical History:  Procedure Laterality Date  . BACK SURGERY    . BIV ICD INSERTION CRT-D N/A 04/06/2017   Procedure: BIV ICD INSERTION CRT-D;  Surgeon: Evans Lance, MD;  Location: [facility] CV LAB;  Service: Cardiovascular;  Laterality: N/A;  . INNER EAR SURGERY Right 1980s  . INSERTION OF ICD  04/06/2017   biv  . LAPAROSCOPIC CHOLECYSTECTOMY  1990s  . NEUROPLASTY / TRANSPOSITION MEDIAN NERVE AT CARPAL TUNNEL Left ~  2010  . POSTERIOR FUSION LUMBAR SPINE  2005  . RIGHT HEART CATH N/A 03/27/2017   Procedure: RIGHT HEART CATH;  Surgeon: Laurey Morale, MD;  Location: [facility] INVASIVE CV LAB;  Service: Cardiovascular;  Laterality: N/A;  . RIGHT OOPHORECTOMY Right 1990s  . RIGHT/LEFT HEART CATH AND CORONARY ANGIOGRAPHY N/A 11/21/2016   Procedure: Right/Left Heart Cath and Coronary Angiography;  Surgeon: Kathleene Hazel, MD;  Location: [facility] INVASIVE CV LAB;  Service: Cardiovascular;  Laterality: N/A;  . TEAR DUCT PROBING Bilateral 2000s  . TEE WITHOUT CARDIOVERSION N/A 11/22/2016   Procedure:  TRANSESOPHAGEAL ECHOCARDIOGRAM (TEE);  Surgeon: Chrystie Nose, MD;  Location: [facility] ENDOSCOPY;  Service: Cardiovascular;  Laterality: N/A;  . TEE WITHOUT CARDIOVERSION N/A 03/29/2017   Procedure: TRANSESOPHAGEAL ECHOCARDIOGRAM (TEE);  Surgeon: Laurey Morale, MD;  Location: [facility] ENDOSCOPY;  Service: Cardiovascular;  Laterality: N/A;  . VAGINAL HYSTERECTOMY  1989     Current Outpatient Medications  Medication Sig Dispense Refill  . acetaminophen (TYLENOL) 650 MG CR tablet Take 1,300 mg by mouth daily as needed for pain.    [name] Kitchen albuterol (PROVENTIL HFA;VENTOLIN HFA) 108 (90 Base) MCG/ACT inhaler Inhale 2 puffs into the lungs every 6 (six) hours as needed for wheezing or shortness of breath.    . allopurinol (ZYLOPRIM) 300 MG tablet Take 300 mg by mouth daily.    [name] Kitchen ALPRAZolam (XANAX) 0.25 MG tablet Take 0.125 mg by mouth daily as needed for anxiety.     [name] Kitchen aspirin EC 81 MG tablet Take 81 mg by mouth every morning.     [name] Kitchen buPROPion (WELLBUTRIN XL) 150 MG 24 hr tablet TAKE 1 TABLET BY MOUTH EVERY DAY IN THE MORNING    . cholecalciferol (VITAMIN D) 1000 UNITS tablet Take 1,000 Units by mouth daily.    . colchicine 0.6 MG tablet Take 0.6 mg by mouth daily as needed (FLAREUPS).    . Cyanocobalamin (VITAMIN B-12) 2500 MCG SUBL Place 2,500 mcg under the tongue daily.    [name] Kitchen ENTRESTO 24-26 MG TAKE 1 TABLET TWICE A DAY (DISCONTINUE LOSARTAN) 180 tablet 3  . escitalopram (LEXAPRO) 20 MG tablet Take 20 mg by mouth every morning.     . fluticasone (FLONASE) 50 MCG/ACT nasal spray Place 1 spray into both nostrils daily as needed for allergies or rhinitis.    . fluticasone furoate-vilanterol (BREO ELLIPTA) 200-25 MCG/INH AEPB Inhale 1 puff into the lungs daily.    . furosemide (LASIX) 40 MG tablet TAKE 1 TABLET IN THE MORNING AND 1/2 TABLET IN THE EVENING 135 tablet 3  . levalbuterol (XOPENEX) 0.63 MG/3ML nebulizer solution Take 0.63 mg by nebulization every 4 (four) hours as needed for wheezing or shortness of  breath.    . levocetirizine (XYZAL) 5 MG tablet Take 5 mg by mouth every evening.    . metoprolol succinate (TOPROL-XL) 25 MG 24 hr tablet TAKE 1 TABLET IN THE MORNING AND 2 TABLETS IN THE EVENING 270 tablet 3  . simvastatin (ZOCOR) 20 MG tablet TAKE 1 TABLET AT BEDTIME 90 tablet 4  . spironolactone (ALDACTONE) 25 MG tablet TAKE 1 TABLET AT BEDTIME 90 tablet 3  . trimethoprim-polymyxin b (POLYTRIM) ophthalmic solution PLACE 1 DROP IN AFFECTED EYE EVERY 3 4 HOURS AS NEEDED     No current facility-administered medications for this encounter.     Allergies:   Other, Adhesive [tape], and Morphine and related   Social History:  The patient  reports that she quit smoking about 31 years ago. Her smoking  use included cigarettes. She has a 9.75 pack-year smoking history. She has never used smokeless tobacco. She reports current alcohol use. She reports that she does not use drugs.   Family History:  The patient's family history includes Hypertension in her mother.   ROS:  Please see the history of present illness.   All other systems are personally reviewed and negative.  Vitals:   07/16/19 1340  BP: (!) 164/83  Pulse: 68  SpO2: 97%    Exam:  General:  Well appearing. No resp difficulty HEENT: normal Neck: supple. no JVD. Carotids 2+ bilat; no bruits. No lymphadenopathy or thryomegaly appreciated. Cor: PMI nondisplaced. Regular rate & rhythm. No rubs, gallops or murmurs. Lungs: clear Abdomen: obese, soft, nontender, nondistended. No hepatosplenomegaly. No bruits or masses. Good bowel sounds. Extremities: no cyanosis, clubbing, rash, edema Neuro: alert & orientedx3, cranial nerves grossly intact. moves all 4 extremities w/o difficulty. Affect pleasant  Recent Labs: No results found for requested labs within last 8760 hours.  Personally reviewed   Wt Readings from Last 3 Encounters:  07/16/19 133.5 kg (294 lb 6.4 oz)  12/18/18 (!) 137.4 kg (303 lb)  03/20/18 136.1 kg (300 lb)       ASSESSMENT AND PLAN:  1. Chronic systolic CHF: EF 69-62%25-30% on echo in 4/18, nonischemic cardiomyopathy.  She had severe mitral regurgitation and a rheumatic-appearing mitral valve.  It is hard to know which was the initial problem (cardiomyopathy or mitral regurgitation), and hard to know how related they are.  However, based on most recent echo, suspect that she has a nonischemic cardiomyopathy from some other etiology like myocarditis and has a co-existing rheumatic mitral valve with perhaps worsening of MR from annular dilatation (secondary MR). Followup TEE in 8/18 showed EF 30-35% with dyssynchrony but MR only moderate.  RHC showed low filling pressures.  Now s/p [employer] CRT-D.   - Echo 09/2017 with EF up to 55-60% and MR was only mild.  - NYHA II-III. Volume status stable. Check BMET  . Continue Lasix 40 qam/20 qpm.    - Continue spiro 25 mg daily - Continue Entresto 24/26 mg BID - Continue Toprol 25 mg am, 50 mg pm 2. Rheumatic heart disease: Severe MR on 4/18 TEE with a rheumatic-appearing valve, also moderate TR.  There were 2 jets of MR from 2 defined areas of malcoaptation.  Per Dr Shirlee LatchMcLean there was a co-existing nonischemic cardiomyopathy with a component of functional MR from annular dilatation.   - Repeat TEE in 8/18 showed moderate MR, and echo 09/2017 with normalization of EF showed only mild MR.  It appears that most of the MR was functional. No change.  - Repeat ECHO next visit.  3. LBBB: s/p CRT device placement.  4. Lung nodule: CTA chest 12/2016: 17 mm well-circumscribed round nodule in RUL. 3-6 month repeat CT's recommended.  Looks like she has canceled several appointments for repeat CT scans.  5. Obesity:  Body mass index is 42.85 kg/m.  Discussed portion control   7. Depression: Per PCP.  8. Insomnia 9. Snores, Day Time Fatigue Set up for home sleep study. Concerned she may have sleep apnea.  10. HTN Elevated today but has not had meds today. Needs to take her  medications as prescribed.   I have discussed the importance of taking her medications. I have referred her to HF Exercise Physiologist. I would like her to increase activity. Check BMET   Follow up in 6 months with an ECHO and Dr  McLean. Check BMET.   Waneta Martins, NP  07/16/2019 1:48 PM  [facility] [address] Heart and Vascular [place] [address] [phone] (office) [phone] (fax)

## 2019-07-16 NOTE — Progress Notes (Signed)
Patient referred to Clinical Exercise Physiologist by Darrick Grinder, NP, for guidance and discussion about safe home exercises and/or starting an exercise program. Exercises were demonstrated and detailed with safety precautions to patient and accompanying caregiver (if present). Patient was presented with an information packet including demonstrations of the exercises discussed.  Patient was evaluated for virtual Exercise and Wellness Coaching to increase her physical activity with COVID restrictions and concerns. She expressed concerns about safety with COVID-19, CEP provided ways for her to exercise to limit her risk of exposing herself to the virus. Patient was receptive. She used to walk 3-4 miles with a walking group in the past and has not been exercising at all since the COVID-19. She really enjoys walking and how she felt with her routine. Currently active less than 1 hour out of the day according to her device interrogation. She has difficulty sleeping, has gained weight and hopes for these things to improve once she restarts her exercise routine. Her daughter is currently staying with her and her husband and presents as a strong support for patient. She anticipates exercising with her daughter in the coming days.   Patient is in the readiness phase of behavior change and has established an action plan in which she feels confident. She feels walking 4 days in the next week is a realistic plan to begin her exercise restart. She plans to track (distance and time) on her phone and report to CEP/EWC next week. She also will report her feelings surrounding the week as she increased her exercise.     CEP/EWC will follow-up Next Wednesday 12/9  Via telephone.   All patient's questions were answered and patient was given contact information for further questions or concerns regarding their exercise.     Landis Martins, MS, ACSM-RCEP Clinical Exercise Physiologist

## 2019-07-16 NOTE — Patient Instructions (Signed)
It was great to see you today! No medication changes are needed at this time.  Labs today We will only contact you if something comes back abnormal or we need to make some changes. Otherwise no news is good news!  Your provider has recommended that you have a home sleep study.  [employer] is the company that does these test.  They will contact you by phone and must speak with you before they can ship the equipment.  Once they have spoken with you they will send the equipment right to your home with instructions on how to set it up.  Once you have completed the test simply box all the equipment back up and mail back to the company.  IF you have any questions or issues with the equipment please call the company directly at [phone].  If your test is positive for sleep apnea and you need a home CPAP machine you will be contacted by Dr Theodosia Blender office [facility]) to set this up.  Your physician recommends that you schedule a follow-up appointment in: 6 months with Dr Aundra Dubin - we will call you closer to that time to set up an appointment, if you have not heard from [place] by May 2021 please give our office a call to schedule  Your physician has requested that you have an echocardiogram. Echocardiography is a painless test that uses sound waves to create images of your heart. It provides your doctor with information about the size and shape of your heart and how well your heart's chambers and valves are working. This procedure takes approximately one hour. There are no restrictions for this procedure.  Do the following things EVERYDAY: 1) Weigh yourself in the morning before breakfast. Write it down and keep it in a log. 2) Take your medicines as prescribed 3) Eat low salt foods-Limit salt (sodium) to 2000 mg per day.  4) Stay as active as you can everyday 5) Limit all fluids for the day to less than 2 liters  At the [facility], you and your health needs are our priority. As  part of our continuing mission to provide you with exceptional heart care, we have created designated Provider Care Teams. These Care Teams include your primary Cardiologist (physician) and Advanced Practice Providers (APPs- Physician Assistants and Nurse Practitioners) who all work together to provide you with the care you need, when you need it.   You may see any of the following providers on your designated Care Team at your next follow up: [name] Kitchen Dr Glori Bickers . Dr Loralie Champagne . Darrick Grinder, NP . Lyda Jester, PA . Audry Riles, PharmD   Please be sure to bring in all your medications bottles to every appointment.

## 2019-07-16 NOTE — Progress Notes (Signed)
Patient Name: Margaret Vargas        DOB: 07-May-1951      Height:  5'9.5"    CZYSAY:301 lb  Office Name: [facility]         Referring Provider:Amy Clegg,NP  Today's Date:07/16/2019  Date:   STOP BANG RISK ASSESSMENT S (snore) Have you been told that you snore?     YES   T (tired) Are you often tired, fatigued, or sleepy during the day?   YES  O (obstruction) Do you stop breathing, choke, or gasp during sleep? NO   P (pressure) Do you have or are you being treated for high blood pressure? YES   B (BMI) Is your body index greater than 35 kg/m? YES   A (age) Are you 67 years old or older? YES   N (neck) Do you have a neck circumference greater than 16 inches?   YES   G (gender) Are you a female? NO   TOTAL STOP/BANG "YES" ANSWERS                                                                        For Office Use Only              Procedure Order Form    YES to 3+ Stop Bang questions OR two clinical symptoms - patient qualifies for Watch[employer] (CPT 95800)     Submit: This Form + Patient Face Sheet + Clinical Note via Cloud[employer] or Fax: [phone]         Clinical Notes: Will consult Sleep Specialist and refer for management of therapy due to patient increased risk of Sleep Apnea. Ordering a sleep study due to the following two clinical symptoms: Excessive daytime sleepiness G47.10/ Loud snoring R06.83 / Unrefreshed by sleep G47.8 / Impotence N52.9 / History of high blood pressure R03.0 / Insomnia G47.00    I understand that I am proceeding with a home sleep apnea test as ordered by my treating physician. I understand that untreated sleep apnea is a serious cardiovascular risk factor and it is my responsibility to perform the test and seek management for sleep apnea. I will be contacted with the results and be managed for sleep apnea by a local sleep physician. I will be receiving equipment and further instructions from [facility]. I shall promptly ship back the  equipment via the included mailing label. I understand my insurance will be billed for the test and as the patient I am responsible for any insurance related out-of-pocket costs incurred. I have been provided with written instructions and can call for additional video or telephonic instruction, with 24-hour availability of qualified personnel to answer any questions: Patient Help Desk [phone].  Patient Signature ______________________________________________________   Date______________________ Patient Telemedicine Verbal Consent

## 2019-07-17 ENCOUNTER — Telehealth (HOSPITAL_COMMUNITY): Payer: Self-pay | Admitting: Cardiology

## 2019-07-17 NOTE — Telephone Encounter (Signed)
Order, OV note, stop bang and demographics all faxed to [employer] at [phone] via epic  

## 2019-07-18 ENCOUNTER — Telehealth (HOSPITAL_COMMUNITY): Payer: Self-pay

## 2019-07-18 NOTE — Telephone Encounter (Signed)
ReFaxed demographic and insurance information to [employer] @ [phone].

## 2019-07-24 ENCOUNTER — Telehealth (HOSPITAL_COMMUNITY): Payer: Self-pay | Admitting: *Deleted

## 2019-07-24 NOTE — Telephone Encounter (Signed)
Briefly met with patient in HF-APP clinic face-to-face last week per referral from [place], Ninfa Meeker. Called today to follow-up with planned intake evaluation today for exercise and wellness coaching. 2 attempts today with 2 voicemails. RCEP will try again Friday 12/11.     Landis Martins, MS, ACSM-RCEP Clinical Exercise Physiologist

## 2019-08-01 ENCOUNTER — Telehealth (HOSPITAL_COMMUNITY): Payer: Self-pay

## 2019-08-01 NOTE — Telephone Encounter (Signed)
Called Patient to discuss her [facility] Sleep Study because [employer] sent notification that they had attempted 3x's to contact the Patient. I gave the Patient the number to [employer] and asked her to contact them and she stated that she would.

## 2019-08-02 ENCOUNTER — Telehealth (HOSPITAL_COMMUNITY): Payer: Self-pay | Admitting: *Deleted

## 2019-08-02 NOTE — Telephone Encounter (Signed)
Have made several attempts to reach patient with no success. Left a voicemail today to provide support for her exercise programming. With several attempts to reach patient with no success and no response, I will not contact patient again until she responds with a request for further support. This information was relayed to her in the voicemail.    Landis Martins, MS, ACSM-RCEP Clinical Exercise Physiologist/ Health and Wellness Coach

## 2019-08-08 DIAGNOSIS — Z20828 Contact with and (suspected) exposure to other viral communicable diseases: Secondary | ICD-10-CM | POA: Diagnosis not present

## 2019-08-13 DIAGNOSIS — Z20828 Contact with and (suspected) exposure to other viral communicable diseases: Secondary | ICD-10-CM | POA: Diagnosis not present

## 2019-08-15 DIAGNOSIS — Z20828 Contact with and (suspected) exposure to other viral communicable diseases: Secondary | ICD-10-CM | POA: Diagnosis not present

## 2019-08-15 DIAGNOSIS — R05 Cough: Secondary | ICD-10-CM | POA: Diagnosis not present

## 2019-09-23 ENCOUNTER — Ambulatory Visit: Payer: Medicare HMO | Attending: Internal Medicine

## 2019-09-23 DIAGNOSIS — Z23 Encounter for immunization: Secondary | ICD-10-CM | POA: Insufficient documentation

## 2019-09-23 NOTE — Progress Notes (Signed)
   Covid-19 Vaccination Clinic  Name:  Margaret Vargas    MRN: [id number] DOB: Sep 11, 1950  09/23/2019  Margaret Vargas was observed post Covid-19 immunization for 15 minutes without incidence. She was provided with Vaccine Information Sheet and instruction to access the V-Safe system.   Margaret Vargas was instructed to call 911 with any severe reactions post vaccine: [name] Kitchen Difficulty breathing  . Swelling of your face and throat  . A fast heartbeat  . A bad rash all over your body  . Dizziness and weakness    Immunizations Administered    Name Date Dose VIS Date Route   Pfizer COVID-19 Vaccine 09/23/2019  9:48 AM 0.3 mL 07/26/2019 Intramuscular   Manufacturer: [employer], [employer]   Lot: [id number]   NDC: [address]-4

## 2019-10-09 ENCOUNTER — Ambulatory Visit (INDEPENDENT_AMBULATORY_CARE_PROVIDER_SITE_OTHER): Payer: Medicare HMO | Admitting: *Deleted

## 2019-10-09 DIAGNOSIS — I5022 Chronic systolic (congestive) heart failure: Secondary | ICD-10-CM | POA: Diagnosis not present

## 2019-10-10 LAB — CUP PACEART REMOTE DEVICE CHECK
Battery Remaining Longevity: 126 mo
Battery Remaining Percentage: 100 %
Brady Statistic RA Percent Paced: 0 %
Brady Statistic RV Percent Paced: 0 %
Date Time Interrogation Session: 20210224005100
HighPow Impedance: 89 Ohm
Implantable Lead Implant Date: 20180823
Implantable Lead Implant Date: 20180823
Implantable Lead Implant Date: 20180823
Implantable Lead Location: 753858
Implantable Lead Location: 753859
Implantable Lead Location: 753860
Implantable Lead Model: 293
Implantable Lead Model: 4674
Implantable Lead Model: 7741
Implantable Lead Serial Number: 435016
Implantable Lead Serial Number: 609786
Implantable Lead Serial Number: 872654
Implantable Pulse Generator Implant Date: 20180823
Lead Channel Impedance Value: 426 Ohm
Lead Channel Impedance Value: 625 Ohm
Lead Channel Impedance Value: 766 Ohm
Lead Channel Setting Pacing Amplitude: 2 V
Lead Channel Setting Pacing Amplitude: 2.3 V
Lead Channel Setting Pacing Amplitude: 2.5 V
Lead Channel Setting Pacing Pulse Width: 0.4 ms
Lead Channel Setting Pacing Pulse Width: 0.8 ms
Lead Channel Setting Sensing Sensitivity: 0.5 mV
Lead Channel Setting Sensing Sensitivity: 1 mV
Pulse Gen Serial Number: 186593

## 2019-10-11 ENCOUNTER — Ambulatory Visit: Payer: Medicare HMO

## 2019-10-16 ENCOUNTER — Ambulatory Visit: Payer: Medicare HMO | Attending: Internal Medicine

## 2019-10-16 DIAGNOSIS — Z23 Encounter for immunization: Secondary | ICD-10-CM | POA: Insufficient documentation

## 2019-10-16 NOTE — Progress Notes (Signed)
   Covid-19 Vaccination Clinic  Name:  Margaret Vargas    MRN: [id number] DOB: 29-Mar-1951  10/16/2019  Margaret Vargas was observed post Covid-19 immunization for 15 minutes without incident. She was provided with Vaccine Information Sheet and instruction to access the V-Safe system.   Margaret Vargas was instructed to call 911 with any severe reactions post vaccine: [name] Kitchen Difficulty breathing  . Swelling of face and throat  . A fast heartbeat  . A bad rash all over body  . Dizziness and weakness   Immunizations Administered    Name Date Dose VIS Date Route   Pfizer COVID-19 Vaccine 10/16/2019 10:21 AM 0.3 mL 07/26/2019 Intramuscular   Manufacturer: [employer], [employer]   Lot: [id number]   NDC: [address]-1

## 2019-10-31 ENCOUNTER — Other Ambulatory Visit (HOSPITAL_COMMUNITY): Payer: Self-pay | Admitting: Cardiology

## 2019-11-24 ENCOUNTER — Other Ambulatory Visit (HOSPITAL_COMMUNITY): Payer: Self-pay | Admitting: Cardiology

## 2019-12-10 ENCOUNTER — Telehealth: Payer: Self-pay | Admitting: Internal Medicine

## 2019-12-10 NOTE — Telephone Encounter (Signed)
Spoke with patient and she confirmed she has no CP, no palpitations, no dizziness and has had no syncope. She reports a "vibration " that started this morning that lasts a few seconds that is occurring on the sternal edge of her right breast every few minutes. CRT-D is located in left chest. No pain. Reports she has had no increased SOB beyond her daily baseline or any other change in condition. Patient will send a remote transmission for review when she gets home from work. Provided the device clinic # to contact at 8 am in the morning if she needs assistance with transmission. ED precautions given.

## 2019-12-10 NOTE — Telephone Encounter (Signed)
Patient states that she feels a small vibration in her chest. She would not say it is chest pain/pressure. She states she only feels it when she is sitting down. Dneies any other symptoms. She does have a defib but never noticed the vibration until recently.

## 2019-12-11 NOTE — Telephone Encounter (Signed)
Remote transmission reviewed, device function WNL, no events or episodes since 10/2019. Patient made aware that symptoms appear to be unrelated to device and advised to contact PCP about sensation in her right breast.

## 2019-12-11 NOTE — Telephone Encounter (Signed)
Follow up    Pt is calling back asking to speak with Margaret Vargas  Pt says she had to do a manual transmission last night and was wondering if they saw any abnormalities    Please advise

## 2019-12-22 ENCOUNTER — Other Ambulatory Visit (HOSPITAL_COMMUNITY): Payer: Self-pay | Admitting: Internal Medicine

## 2020-01-08 ENCOUNTER — Ambulatory Visit (INDEPENDENT_AMBULATORY_CARE_PROVIDER_SITE_OTHER): Payer: Medicare HMO | Admitting: *Deleted

## 2020-01-08 DIAGNOSIS — I428 Other cardiomyopathies: Secondary | ICD-10-CM | POA: Diagnosis not present

## 2020-01-08 LAB — CUP PACEART REMOTE DEVICE CHECK
Battery Remaining Longevity: 120 mo
Battery Remaining Percentage: 100 %
Brady Statistic RA Percent Paced: 0 %
Brady Statistic RV Percent Paced: 0 %
Date Time Interrogation Session: 20210526005100
HighPow Impedance: 89 Ohm
Implantable Lead Implant Date: 20180823
Implantable Lead Implant Date: 20180823
Implantable Lead Implant Date: 20180823
Implantable Lead Location: 753858
Implantable Lead Location: 753859
Implantable Lead Location: 753860
Implantable Lead Model: 293
Implantable Lead Model: 4674
Implantable Lead Model: 7741
Implantable Lead Serial Number: 435016
Implantable Lead Serial Number: 609786
Implantable Lead Serial Number: 872654
Implantable Pulse Generator Implant Date: 20180823
Lead Channel Impedance Value: 418 Ohm
Lead Channel Impedance Value: 644 Ohm
Lead Channel Impedance Value: 707 Ohm
Lead Channel Setting Pacing Amplitude: 2 V
Lead Channel Setting Pacing Amplitude: 2.3 V
Lead Channel Setting Pacing Amplitude: 2.5 V
Lead Channel Setting Pacing Pulse Width: 0.4 ms
Lead Channel Setting Pacing Pulse Width: 0.8 ms
Lead Channel Setting Sensing Sensitivity: 0.5 mV
Lead Channel Setting Sensing Sensitivity: 1 mV
Pulse Gen Serial Number: 186593

## 2020-01-10 NOTE — Progress Notes (Signed)
Remote ICD transmission.   

## 2020-03-16 ENCOUNTER — Other Ambulatory Visit (HOSPITAL_COMMUNITY): Payer: Self-pay | Admitting: Cardiology

## 2020-03-29 ENCOUNTER — Other Ambulatory Visit (HOSPITAL_COMMUNITY): Payer: Self-pay | Admitting: Cardiology

## 2020-04-08 ENCOUNTER — Ambulatory Visit (INDEPENDENT_AMBULATORY_CARE_PROVIDER_SITE_OTHER): Payer: Medicare HMO | Admitting: *Deleted

## 2020-04-08 DIAGNOSIS — I428 Other cardiomyopathies: Secondary | ICD-10-CM

## 2020-04-10 LAB — CUP PACEART REMOTE DEVICE CHECK
Battery Remaining Longevity: 120 mo
Battery Remaining Percentage: 100 %
Brady Statistic RA Percent Paced: 0 %
Brady Statistic RV Percent Paced: 0 %
Date Time Interrogation Session: 20210825005200
HighPow Impedance: 83 Ohm
Implantable Lead Implant Date: 20180823
Implantable Lead Implant Date: 20180823
Implantable Lead Implant Date: 20180823
Implantable Lead Location: 753858
Implantable Lead Location: 753859
Implantable Lead Location: 753860
Implantable Lead Model: 293
Implantable Lead Model: 4674
Implantable Lead Model: 7741
Implantable Lead Serial Number: 435016
Implantable Lead Serial Number: 609786
Implantable Lead Serial Number: 872654
Implantable Pulse Generator Implant Date: 20180823
Lead Channel Impedance Value: 414 Ohm
Lead Channel Impedance Value: 664 Ohm
Lead Channel Impedance Value: 762 Ohm
Lead Channel Setting Pacing Amplitude: 2 V
Lead Channel Setting Pacing Amplitude: 2.3 V
Lead Channel Setting Pacing Amplitude: 2.5 V
Lead Channel Setting Pacing Pulse Width: 0.4 ms
Lead Channel Setting Pacing Pulse Width: 0.8 ms
Lead Channel Setting Sensing Sensitivity: 0.5 mV
Lead Channel Setting Sensing Sensitivity: 1 mV
Pulse Gen Serial Number: 186593

## 2020-04-14 NOTE — Progress Notes (Signed)
Remote ICD transmission.   

## 2020-04-29 ENCOUNTER — Other Ambulatory Visit (HOSPITAL_COMMUNITY): Payer: Self-pay | Admitting: Cardiology

## 2020-05-27 ENCOUNTER — Telehealth (HOSPITAL_COMMUNITY): Payer: Self-pay | Admitting: Cardiology

## 2020-05-27 MED ORDER — SIMVASTATIN 40 MG PO TABS: 40.0000 mg | ORAL_TABLET | Freq: Every day | ORAL | 3 refills | Status: DC

## 2020-05-27 NOTE — Telephone Encounter (Signed)
Abnormal labs received Labs drawn 10.11.21 LDL 105 Per Brittainy Simmons PA Increase simvastatin to 40 mg daily  Pt aware

## 2020-05-28 ENCOUNTER — Other Ambulatory Visit (HOSPITAL_COMMUNITY): Payer: Self-pay | Admitting: Cardiology

## 2020-06-15 ENCOUNTER — Other Ambulatory Visit (HOSPITAL_COMMUNITY): Payer: Self-pay | Admitting: Cardiology

## 2020-07-08 ENCOUNTER — Ambulatory Visit (INDEPENDENT_AMBULATORY_CARE_PROVIDER_SITE_OTHER): Payer: Medicare HMO

## 2020-07-08 DIAGNOSIS — I428 Other cardiomyopathies: Secondary | ICD-10-CM

## 2020-07-08 DIAGNOSIS — I5022 Chronic systolic (congestive) heart failure: Secondary | ICD-10-CM

## 2020-07-13 LAB — CUP PACEART REMOTE DEVICE CHECK
Battery Remaining Longevity: 114 mo
Battery Remaining Percentage: 100 %
Brady Statistic RA Percent Paced: 0 %
Brady Statistic RV Percent Paced: 0 %
Date Time Interrogation Session: 20211129005100
HighPow Impedance: 82 Ohm
Implantable Lead Implant Date: 20180823
Implantable Lead Implant Date: 20180823
Implantable Lead Implant Date: 20180823
Implantable Lead Location: 753858
Implantable Lead Location: 753859
Implantable Lead Location: 753860
Implantable Lead Model: 293
Implantable Lead Model: 4674
Implantable Lead Model: 7741
Implantable Lead Serial Number: 435016
Implantable Lead Serial Number: 609786
Implantable Lead Serial Number: 872654
Implantable Pulse Generator Implant Date: 20180823
Lead Channel Impedance Value: 402 Ohm
Lead Channel Impedance Value: 652 Ohm
Lead Channel Impedance Value: 709 Ohm
Lead Channel Setting Pacing Amplitude: 2 V
Lead Channel Setting Pacing Amplitude: 2.3 V
Lead Channel Setting Pacing Amplitude: 2.5 V
Lead Channel Setting Pacing Pulse Width: 0.4 ms
Lead Channel Setting Pacing Pulse Width: 0.8 ms
Lead Channel Setting Sensing Sensitivity: 0.5 mV
Lead Channel Setting Sensing Sensitivity: 1 mV
Pulse Gen Serial Number: 186593

## 2020-07-15 NOTE — Progress Notes (Signed)
Remote ICD transmission.   

## 2020-08-13 ENCOUNTER — Telehealth: Payer: Self-pay | Admitting: Internal Medicine

## 2020-08-13 NOTE — Telephone Encounter (Signed)
Attempted phone call to pt.  Left voicemail message to contact triage at [phone]. 

## 2020-08-13 NOTE — Telephone Encounter (Signed)
Pt c/o of Chest Pain: STAT if CP now or developed within 24 hours  1. Are you having CP right now?  Pain  At the end of her pacemaker incision when sh she raise her arml  2. Are you experiencing any other symptoms (ex. SOB, nausea, vomiting, sweating)? No  3. How long have you been experiencing CP? About 2 weeks  4. Is your CP continuous or coming and going? Comes and goes  5. Have you taken Nitroglycerin? No- pt wanted an appt- first thing we had was 08-26-20 with Peachie ?

## 2020-08-13 NOTE — Telephone Encounter (Signed)
Patient returning call.

## 2020-08-13 NOTE — Telephone Encounter (Signed)
Returned call to patient who states she is having discomfort when she raises her left arm or reaches forward. States pain at the location of the bottom left corner of the pacemaker. Does not feel increased pain when pressing on the pacemaker and denies redness, swelling, heat, or other concerns at the site of the pacemaker. She is overdue for in office defib check and has been scheduled to see Francis Dowse, PA on 1/12. Advised that she may use OTC voltaren gel or Tylenol or may try a few limited doses of NSAID for pain relief. She verbalized understanding and agreement and I advised her to call back with questions or concerns. She thanked me for the call.

## 2020-08-25 NOTE — Progress Notes (Deleted)
Cardiology Office Note Date:  08/25/2020  Patient ID:  Margaret Vargas 05-23-1951, MRN [id number] PCP:  Mila Palmer, MD  Cardiologist:  Dr. Shirlee Latch Electrophysiologist: Dr. Ladona Ridgel  ***refresh   Chief Complaint: *** discomfort near pacer, over due EP visit  History of Present Illness: Margaret Vargas is a 70 y.o. female with history of suspect hx of rheumatic fever, NICM, chronic CHF, ICD,   2018 hospitalized with HF, echo and a TEE, showing EF 25-30% with rheumatic-appearing mitral valve and severe mitral regurgitation. She has been seen by Dr Barry Dienes for evaluation for surgical repair/replacement of the MV. Given improvement in symptoms, he suggested repeating TEE eventually to reassess. Therefore, she had repeat RHC and TEE in 8/18 after medical therapy. EF was 30-35% with septal-lateral dyssynchrony and there was only moderate functional MR. Subsequent echos with mild MR.  She comes in today to be seen for Dr. Ladona Ridgel, last seen by him in 2018. Follows regularly with AHF team, most recently Dec 2020. Noted a few canceled appts for follow up CT scans on lung nodule, planned for sleep study with suspect apnea. Planned to see HF exercise physiologist and increase her activity, see her back in 62mo.  *** pain at device?  Skin? Fluctuation, ? Infection? *** meds, CM *** volume *** over due for HF team *** labs *** needs HF f/u, over due, nothing scheduled   Device information BSCi CRT-D implanted 04/06/2017   Past Medical History:  Diagnosis Date  . Acute CHF (congestive heart failure) (HCC) 11/16/2016   [name] 11/16/2016  . AICD (automatic cardioverter/defibrillator) present 04/06/2017  . Anxiety    [name] 11/16/2016  . Arthritis    "neck" (11/16/2016)  . Asthma   . Chronic bronchitis (HCC)   . Chronic neck pain   . Depression    [name] 11/16/2016  . Gout   . 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 Heart murmur    "when I was a child"  . High cholesterol   .  History of blood transfusion    "w/a back OR"  . Hypertension    [name] 11/16/2016  . Incidental lung nodule, greater than or equal to 57mm 01/02/2017   Probable granuloma right upper lobe - needs f/u imaging  . Pre-diabetes   . Sleep apnea    "need a mask/tests" (11/16/2016)  . Stroke [facility]) 05/2008   "mini stroke"; denies residual on 11/16/2016  . Thyroid nodule   . TIA (transient ischemic attack) "several"    Past Surgical History:  Procedure Laterality Date  . BACK SURGERY    . BIV ICD INSERTION CRT-D N/A 04/06/2017   Procedure: BIV ICD INSERTION CRT-D;  Surgeon: Marinus Maw, MD;  Location: [facility] INVASIVE CV LAB;  Service: Cardiovascular;  Laterality: N/A;  . INNER EAR SURGERY Right 1980s  . INSERTION OF ICD  04/06/2017   biv  . LAPAROSCOPIC CHOLECYSTECTOMY  1990s  . NEUROPLASTY / TRANSPOSITION MEDIAN NERVE AT CARPAL TUNNEL Left ~ 2010  . POSTERIOR FUSION LUMBAR SPINE  2005  . RIGHT HEART CATH N/A 03/27/2017   Procedure: RIGHT HEART CATH;  Surgeon: Laurey Morale, MD;  Location: [facility] INVASIVE CV LAB;  Service: Cardiovascular;  Laterality: N/A;  . RIGHT OOPHORECTOMY Right 1990s  . RIGHT/LEFT HEART CATH AND CORONARY ANGIOGRAPHY N/A 11/21/2016   Procedure: Right/Left Heart Cath and Coronary Angiography;  Surgeon: Kathleene Hazel, MD;  Location: [facility] INVASIVE CV LAB;  Service: Cardiovascular;  Laterality: N/A;  . TEAR DUCT PROBING Bilateral  2000s  . TEE WITHOUT CARDIOVERSION N/A 11/22/2016   Procedure: TRANSESOPHAGEAL ECHOCARDIOGRAM (TEE);  Surgeon: Chrystie Nose, MD;  Location: [facility] ENDOSCOPY;  Service: Cardiovascular;  Laterality: N/A;  . TEE WITHOUT CARDIOVERSION N/A 03/29/2017   Procedure: TRANSESOPHAGEAL ECHOCARDIOGRAM (TEE);  Surgeon: Laurey Morale, MD;  Location: [facility] ENDOSCOPY;  Service: Cardiovascular;  Laterality: N/A;  . VAGINAL HYSTERECTOMY  1989    Current Outpatient Medications  Medication Sig Dispense Refill  . acetaminophen (TYLENOL) 650 MG CR tablet Take 1,300 mg by  mouth daily as needed for pain.    [name] Kitchen albuterol (PROVENTIL HFA;VENTOLIN HFA) 108 (90 Base) MCG/ACT inhaler Inhale 2 puffs into the lungs every 6 (six) hours as needed for wheezing or shortness of breath.    . allopurinol (ZYLOPRIM) 300 MG tablet Take 300 mg by mouth daily.    [name] Kitchen ALPRAZolam (XANAX) 0.25 MG tablet Take 0.125 mg by mouth daily as needed for anxiety.     [name] Kitchen aspirin EC 81 MG tablet Take 81 mg by mouth every morning.     [name] Kitchen buPROPion (WELLBUTRIN XL) 150 MG 24 hr tablet TAKE 1 TABLET BY MOUTH EVERY DAY IN THE MORNING    . cholecalciferol (VITAMIN D) 1000 UNITS tablet Take 1,000 Units by mouth daily.    . colchicine 0.6 MG tablet Take 0.6 mg by mouth daily as needed (FLAREUPS).    . Cyanocobalamin (VITAMIN B-12) 2500 MCG SUBL Place 2,500 mcg under the tongue daily.    [name] Kitchen escitalopram (LEXAPRO) 20 MG tablet Take 20 mg by mouth every morning.     . fluticasone (FLONASE) 50 MCG/ACT nasal spray Place 1 spray into both nostrils daily as needed for allergies or rhinitis.    . fluticasone furoate-vilanterol (BREO ELLIPTA) 200-25 MCG/INH AEPB Inhale 1 puff into the lungs daily.    . furosemide (LASIX) 40 MG tablet Take 1 tablet (40 mg total) by mouth every morning AND 0.5 tablets (20 mg total) every evening. Needs appt for further refills. 135 tablet 0  . levalbuterol (XOPENEX) 0.63 MG/3ML nebulizer solution Take 0.63 mg by nebulization every 4 (four) hours as needed for wheezing or shortness of breath.    . levocetirizine (XYZAL) 5 MG tablet Take 5 mg by mouth every evening.    . metoprolol succinate (TOPROL-XL) 25 MG 24 hr tablet TAKE 1 TABLET IN THE MORNING AND 2 TABLETS IN THE EVENING NEEDS APPT FOR FURTHER REFILLS 270 tablet 1  . sacubitril-valsartan (ENTRESTO) 24-26 MG Take 1 tablet by mouth 2 (two) times daily. Needs appt for further refills 180 tablet 0  . simvastatin (ZOCOR) 40 MG tablet Take 1 tablet (40 mg total) by mouth at bedtime. 90 tablet 3  . spironolactone (ALDACTONE) 25 MG tablet  TAKE 1 TABLET AT BEDTIME 90 tablet 3  . trimethoprim-polymyxin b (POLYTRIM) ophthalmic solution PLACE 1 DROP IN AFFECTED EYE EVERY 3 4 HOURS AS NEEDED     No current facility-administered medications for this visit.    Allergies:   Other, Adhesive [tape], and Morphine and related   Social History:  The patient  reports that she quit smoking about 33 years ago. Her smoking use included cigarettes. She has a 9.75 pack-year smoking history. She has never used smokeless tobacco. She reports current alcohol use. She reports that she does not use drugs.   Family History:  The patient's family history includes Hypertension in her mother.  ROS:  Please see the history of present illness.    All other systems are reviewed and otherwise  negative.   PHYSICAL EXAM:  VS:  LMP  (LMP Unknown)  BMI: There is no height or weight on file to calculate BMI. Well nourished, well developed, in no acute distress HEENT: normocephalic, atraumatic Neck: no JVD, carotid bruits or masses Cardiac:  *** RRR; no significant murmurs, no rubs, or gallops Lungs:  *** CTA b/l, no wheezing, rhonchi or rales Abd: soft, nontender MS: no deformity or *** atrophy Ext: *** no edema Skin: warm and dry, no rash Neuro:  No gross deficits appreciated Psych: euthymic mood, full affect  *** ICD site is stable, no tethering or discomfort   EKG:  Done today and reviewed by myself shows  ***  Device interrogation done today and reviewed by myself:  ***  09/26/2017: TTE Study Conclusions  - Left ventricle: The cavity size was mildly dilated. Systolic  function was normal. The estimated ejection fraction was in the  range of 55% to 60%. Wall motion was normal; there were no  regional wall motion abnormalities. There was an increased  relative contribution of atrial contraction to ventricular  filling. Doppler parameters are consistent with abnormal left  ventricular relaxation (grade 1 diastolic dysfunction).   - Mitral valve: There was mild regurgitation.   Impressions:  - normal VF with grade 1 DD, mild LVE, mild MR and TR, pacer wire  in RV and RA. Compared to prior echo, LVF and MR have improved  with CRT. The findings indicate borderline septal-lateral left  ventricular wall dyssynchrony.    Recent Labs: No results found for requested labs within last 8760 hours.  No results found for requested labs within last 8760 hours.   CrCl cannot be calculated (Patient's most recent lab result is older than the maximum 21 days allowed.).   Wt Readings from Last 3 Encounters:  07/16/19 294 lb 6.4 oz (133.5 kg)  12/18/18 (!) 303 lb (137.4 kg)  03/20/18 300 lb (136.1 kg)     Other studies reviewed: Additional studies/records reviewed today include: summarized above  ASSESSMENT AND PLAN:  1. ICD     ***  2. NICM 3. Chronic CHF     ***  4.   Disposition: F/u with ***  Current medicines are reviewed at length with the patient today.  The patient did not have any concerns regarding medicines.  Margaret, Hulet, PA-C 08/25/2020 8:09 PM     [facility] [address] [place] [address] [phone] (office)  [phone] (fax)

## 2020-08-26 ENCOUNTER — Encounter: Payer: Medicare HMO | Admitting: Physician Assistant

## 2020-09-04 ENCOUNTER — Encounter: Payer: Medicare HMO | Admitting: Student

## 2020-09-04 NOTE — Progress Notes (Deleted)
Electrophysiology Office Note Date: 09/04/2020  ID:  Margaret Vargas, DOB Nov 05, 1950, MRN [id number]  PCP: Mila Palmer, MD Primary Cardiologist: No primary care provider on file. Electrophysiologist: Lewayne Bunting, MD   CC: Routine ICD follow-up  Margaret Vargas is a 70 y.o. female seen today for Lewayne Bunting, MD for routine electrophysiology followup, last seen in 2018.  Since last being seen in our clinic the patient reports doing ***.  she denies chest pain, palpitations, dyspnea, PND, orthopnea, nausea, vomiting, dizziness, syncope, edema, weight gain, or early satiety. {He/she (caps):30048} has not had ICD shocks.   Device History: [employer] BiV ICD implanted 03/2017 for chronic systolic CHF History of appropriate therapy: No History of AAD therapy: No   Past Medical History:  Diagnosis Date  . Acute CHF (congestive heart failure) (HCC) 11/16/2016   [name] 11/16/2016  . AICD (automatic cardioverter/defibrillator) present 04/06/2017  . Anxiety    [name] 11/16/2016  . Arthritis    "neck" (11/16/2016)  . Asthma   . Chronic bronchitis (HCC)   . Chronic neck pain   . Depression    [name] 11/16/2016  . Gout   . 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 Heart murmur    "when I was a child"  . High cholesterol   . History of blood transfusion    "w/a back OR"  . Hypertension    [name] 11/16/2016  . Incidental lung nodule, greater than or equal to 74mm 01/02/2017   Probable granuloma right upper lobe - needs f/u imaging  . Pre-diabetes   . Sleep apnea    "need a mask/tests" (11/16/2016)  . Stroke [facility]) 05/2008   "mini stroke"; denies residual on 11/16/2016  . Thyroid nodule   . TIA (transient ischemic attack) "several"   Past Surgical History:  Procedure Laterality Date  . BACK SURGERY    . BIV ICD INSERTION CRT-D N/A 04/06/2017   Procedure: BIV ICD INSERTION CRT-D;  Surgeon: Marinus Maw, MD;  Location: [facility] INVASIVE CV LAB;  Service: Cardiovascular;  Laterality:  N/A;  . INNER EAR SURGERY Right 1980s  . INSERTION OF ICD  04/06/2017   biv  . LAPAROSCOPIC CHOLECYSTECTOMY  1990s  . NEUROPLASTY / TRANSPOSITION MEDIAN NERVE AT CARPAL TUNNEL Left ~ 2010  . POSTERIOR FUSION LUMBAR SPINE  2005  . RIGHT HEART CATH N/A 03/27/2017   Procedure: RIGHT HEART CATH;  Surgeon: Laurey Morale, MD;  Location: [facility] INVASIVE CV LAB;  Service: Cardiovascular;  Laterality: N/A;  . RIGHT OOPHORECTOMY Right 1990s  . RIGHT/LEFT HEART CATH AND CORONARY ANGIOGRAPHY N/A 11/21/2016   Procedure: Right/Left Heart Cath and Coronary Angiography;  Surgeon: Kathleene Hazel, MD;  Location: [facility] INVASIVE CV LAB;  Service: Cardiovascular;  Laterality: N/A;  . TEAR DUCT PROBING Bilateral 2000s  . TEE WITHOUT CARDIOVERSION N/A 11/22/2016   Procedure: TRANSESOPHAGEAL ECHOCARDIOGRAM (TEE);  Surgeon: Chrystie Nose, MD;  Location: [facility] ENDOSCOPY;  Service: Cardiovascular;  Laterality: N/A;  . TEE WITHOUT CARDIOVERSION N/A 03/29/2017   Procedure: TRANSESOPHAGEAL ECHOCARDIOGRAM (TEE);  Surgeon: Laurey Morale, MD;  Location: [facility] ENDOSCOPY;  Service: Cardiovascular;  Laterality: N/A;  . VAGINAL HYSTERECTOMY  1989    Current Outpatient Medications  Medication Sig Dispense Refill  . acetaminophen (TYLENOL) 650 MG CR tablet Take 1,300 mg by mouth daily as needed for pain.    [name] Kitchen albuterol (PROVENTIL HFA;VENTOLIN HFA) 108 (90 Base) MCG/ACT inhaler Inhale 2 puffs into the lungs every 6 (six) hours as needed for wheezing or  shortness of breath.    . allopurinol (ZYLOPRIM) 300 MG tablet Take 300 mg by mouth daily.    [name] Kitchen ALPRAZolam (XANAX) 0.25 MG tablet Take 0.125 mg by mouth daily as needed for anxiety.     [name] Kitchen aspirin EC 81 MG tablet Take 81 mg by mouth every morning.     [name] Kitchen buPROPion (WELLBUTRIN XL) 150 MG 24 hr tablet TAKE 1 TABLET BY MOUTH EVERY DAY IN THE MORNING    . cholecalciferol (VITAMIN D) 1000 UNITS tablet Take 1,000 Units by mouth daily.    . colchicine 0.6 MG tablet Take 0.6 mg by mouth  daily as needed (FLAREUPS).    . Cyanocobalamin (VITAMIN B-12) 2500 MCG SUBL Place 2,500 mcg under the tongue daily.    [name] Kitchen escitalopram (LEXAPRO) 20 MG tablet Take 20 mg by mouth every morning.     . fluticasone (FLONASE) 50 MCG/ACT nasal spray Place 1 spray into both nostrils daily as needed for allergies or rhinitis.    . fluticasone furoate-vilanterol (BREO ELLIPTA) 200-25 MCG/INH AEPB Inhale 1 puff into the lungs daily.    . furosemide (LASIX) 40 MG tablet Take 1 tablet (40 mg total) by mouth every morning AND 0.5 tablets (20 mg total) every evening. Needs appt for further refills. 135 tablet 0  . levalbuterol (XOPENEX) 0.63 MG/3ML nebulizer solution Take 0.63 mg by nebulization every 4 (four) hours as needed for wheezing or shortness of breath.    . levocetirizine (XYZAL) 5 MG tablet Take 5 mg by mouth every evening.    . metoprolol succinate (TOPROL-XL) 25 MG 24 hr tablet TAKE 1 TABLET IN THE MORNING AND 2 TABLETS IN THE EVENING NEEDS APPT FOR FURTHER REFILLS 270 tablet 1  . sacubitril-valsartan (ENTRESTO) 24-26 MG Take 1 tablet by mouth 2 (two) times daily. Needs appt for further refills 180 tablet 0  . simvastatin (ZOCOR) 40 MG tablet Take 1 tablet (40 mg total) by mouth at bedtime. 90 tablet 3  . spironolactone (ALDACTONE) 25 MG tablet TAKE 1 TABLET AT BEDTIME 90 tablet 3  . trimethoprim-polymyxin b (POLYTRIM) ophthalmic solution PLACE 1 DROP IN AFFECTED EYE EVERY 3 4 HOURS AS NEEDED     No current facility-administered medications for this visit.    Allergies:   Other, Adhesive [tape], and Morphine and related   Social History: Social History   Socioeconomic History  . Marital status: Married    Spouse name: Not on file  . Number of children: Not on file  . Years of education: Not on file  . Highest education level: Not on file  Occupational History  . Not on file  Tobacco Use  . Smoking status: Former Smoker    Packs/day: 0.75    Years: 13.00    Pack years: 9.75    Types:  Cigarettes    Quit date: 1989    Years since quitting: 33.0  . Smokeless tobacco: Never Used  Vaping Use  . Vaping Use: Never used  Substance and Sexual Activity  . Alcohol use: Yes    Comment: 11/16/2016 "might have 3-4 drinks/month"  . Drug use: No    Comment: 11/16/2016 "I smoked marijuana in college"  . Sexual activity: Not on file  Other Topics Concern  . Not on file  Social History Narrative  . Not on file   Social Determinants of Health   Financial Resource Strain: Not on file  Food Insecurity: Not on file  Transportation Needs: Not on file  Physical Activity: Not on file  Stress: Not on file  Social Connections: Not on file  Intimate Partner Violence: Not on file    Family History: Family History  Problem Relation Age of Onset  . Hypertension Mother     Review of Systems: All other systems reviewed and are otherwise negative except as noted above.   Physical Exam: There were no vitals filed for this visit.   GEN- The patient is well appearing, alert and oriented x 3 today.   HEENT: normocephalic, atraumatic; sclera clear, conjunctiva pink; hearing intact; oropharynx clear; neck supple, no JVP Lymph- no cervical lymphadenopathy Lungs- Clear to ausculation bilaterally, normal work of breathing.  No wheezes, rales, rhonchi Heart- Regular rate and rhythm, no murmurs, rubs or gallops, PMI not laterally displaced GI- soft, non-tender, non-distended, bowel sounds present, no hepatosplenomegaly Extremities- no clubbing or cyanosis. No edema; DP/PT/radial pulses 2+ bilaterally MS- no significant deformity or atrophy Skin- warm and dry, no rash or lesion; ICD pocket well healed Psych- euthymic mood, full affect Neuro- strength and sensation are intact  ICD interrogation- reviewed in detail today,  See PACEART report  EKG:  EKG is ordered today. The ekg ordered today shows ***  Recent Labs: No results found for requested labs within last 8760 hours.   Wt Readings  from Last 3 Encounters:  07/16/19 294 lb 6.4 oz (133.5 kg)  12/18/18 (!) 303 lb (137.4 kg)  03/20/18 300 lb (136.1 kg)     Other studies Reviewed: Additional studies/ records that were reviewed today include: Previous EP and CHF office notes   Assessment and Plan:  1.  Chronic systolic dysfunction s/p Environmental manager CRT-D  euvolemic today Stable on an appropriate medical regimen Normal ICD function See Pace Art report No changes today  2. Obesity There is no height or weight on file to calculate BMI.   3. Left should discomfort ICD site ***  Current medicines are reviewed at length with the patient today.   The patient {ACTIONS; HAS/DOES NOT HAVE:19233} concerns regarding her medicines.  The following changes were made today:  {NONE DEFAULTED:18576::"none"}  Labs/ tests ordered today include: *** No orders of the defined types were placed in this encounter.    Disposition:   Follow up with Dr. Ladona Ridgel  6 months   Signed, Graciella Freer, PA-C  09/04/2020 10:19 AM  [facility] [address] [place] [address] [phone] (office) [phone] (fax)

## 2020-09-14 ENCOUNTER — Other Ambulatory Visit (HOSPITAL_COMMUNITY): Payer: Self-pay | Admitting: Cardiology

## 2020-09-15 NOTE — Telephone Encounter (Signed)
Detailed message left for patient to schedule follow up Refills sent for 30 days Message to pharmacy Letter mailed

## 2020-10-07 ENCOUNTER — Ambulatory Visit (INDEPENDENT_AMBULATORY_CARE_PROVIDER_SITE_OTHER): Payer: Medicare HMO

## 2020-10-07 DIAGNOSIS — I428 Other cardiomyopathies: Secondary | ICD-10-CM

## 2020-10-08 ENCOUNTER — Other Ambulatory Visit: Payer: Self-pay

## 2020-10-08 ENCOUNTER — Other Ambulatory Visit: Payer: Self-pay | Admitting: Family Medicine

## 2020-10-08 ENCOUNTER — Ambulatory Visit
Admission: RE | Admit: 2020-10-08 | Discharge: 2020-10-08 | Disposition: A | Discharge status: Home / Self Care | Payer: Medicare HMO | Source: Ambulatory Visit | Attending: Family Medicine | Admitting: Family Medicine

## 2020-10-08 DIAGNOSIS — M545 Low back pain, unspecified: Secondary | ICD-10-CM

## 2020-10-12 LAB — CUP PACEART REMOTE DEVICE CHECK
Battery Remaining Longevity: 108 mo
Battery Remaining Percentage: 100 %
Brady Statistic RA Percent Paced: 0 %
Brady Statistic RV Percent Paced: 0 %
Date Time Interrogation Session: 20220223005100
HighPow Impedance: 79 Ohm
Implantable Lead Implant Date: 20180823
Implantable Lead Implant Date: 20180823
Implantable Lead Implant Date: 20180823
Implantable Lead Location: 753858
Implantable Lead Location: 753859
Implantable Lead Location: 753860
Implantable Lead Model: 293
Implantable Lead Model: 4674
Implantable Lead Model: 7741
Implantable Lead Serial Number: 435016
Implantable Lead Serial Number: 609786
Implantable Lead Serial Number: 872654
Implantable Pulse Generator Implant Date: 20180823
Lead Channel Impedance Value: 402 Ohm
Lead Channel Impedance Value: 651 Ohm
Lead Channel Impedance Value: 733 Ohm
Lead Channel Setting Pacing Amplitude: 2 V
Lead Channel Setting Pacing Amplitude: 2.3 V
Lead Channel Setting Pacing Amplitude: 2.5 V
Lead Channel Setting Pacing Pulse Width: 0.4 ms
Lead Channel Setting Pacing Pulse Width: 0.8 ms
Lead Channel Setting Sensing Sensitivity: 0.5 mV
Lead Channel Setting Sensing Sensitivity: 1 mV
Pulse Gen Serial Number: 186593

## 2020-10-16 NOTE — Progress Notes (Signed)
Remote ICD transmission.   

## 2020-11-03 ENCOUNTER — Other Ambulatory Visit: Payer: Self-pay | Admitting: Physical Medicine and Rehabilitation

## 2020-11-03 DIAGNOSIS — G8929 Other chronic pain: Secondary | ICD-10-CM

## 2020-11-03 DIAGNOSIS — M545 Low back pain, unspecified: Secondary | ICD-10-CM

## 2020-11-04 ENCOUNTER — Telehealth: Payer: Self-pay

## 2020-11-04 NOTE — Telephone Encounter (Signed)
Phone call to patient to verify medication list and allergies for myelogram procedure. Pt instructed to hold Wellbutrin, Lexapro, and Tramadol for 48hrs prior to myelogram appointment time and 24 hours after appointment. Pt also instructed to have a driver the day of the procedure, the procedure would take around 2 hours, and discharge instructions discussed. Pt verbalized understanding.

## 2020-11-17 ENCOUNTER — Ambulatory Visit
Admission: RE | Admit: 2020-11-17 | Discharge: 2020-11-17 | Disposition: A | Discharge status: Home / Self Care | Payer: Medicare HMO | Source: Ambulatory Visit | Attending: Physical Medicine and Rehabilitation | Admitting: Physical Medicine and Rehabilitation

## 2020-11-17 ENCOUNTER — Other Ambulatory Visit: Payer: Self-pay

## 2020-11-17 DIAGNOSIS — G8929 Other chronic pain: Secondary | ICD-10-CM

## 2020-11-17 DIAGNOSIS — M545 Low back pain, unspecified: Secondary | ICD-10-CM

## 2020-11-17 MED ORDER — DIAZEPAM 5 MG PO TABS
5.0000 mg | ORAL_TABLET | Freq: Once | ORAL | Status: AC
  Administered 2020-11-17: 5 mg via ORAL

## 2020-11-17 MED ORDER — IOPAMIDOL (ISOVUE-M 200) INJECTION 41%
18.0000 mL | Freq: Once | INTRAMUSCULAR | Status: AC
  Administered 2020-11-17: 18 mL via INTRATHECAL

## 2020-11-17 NOTE — Discharge Instructions (Signed)
Myelogram Discharge Instructions  1. Go home and rest quietly for the next 24 hours.  It is important to lie flat for the next 24 hours.  Get up only to go to the restroom.  You may lie in the bed or on a couch on your back, your stomach, your left side or your right side.  You may have one pillow under your head.  You may have pillows between your knees while you are on your side or under your knees while you are on your back.  2. DO NOT drive today.  Recline the seat as far back as it will go, while still wearing your seat belt, on the way home.  3. You may get up to go to the bathroom as needed.  You may sit up for 10 minutes to eat.  You may resume your normal diet and medications unless otherwise indicated.  Drink lots of extra fluids today and tomorrow.  4. The incidence of headache, nausea, or vomiting is about 5% (one in 20 patients).  If you develop a headache, lie flat and drink plenty of fluids until the headache goes away.  Caffeinated beverages may be helpful.  If you develop severe nausea and vomiting or a headache that does not go away with flat bed rest, call [phone].  5. You may resume normal activities after your 24 hours of bed rest is over; however, do not exert yourself strongly or do any heavy lifting tomorrow. If when you get up you have a headache when standing, go back to bed and force fluids for another 24 hours.  6. Call your physician for a follow-up appointment.  The results of your myelogram will be sent directly to your physician by the following day.  7. If you have any questions or if complications develop after you arrive home, please call [phone].  Discharge instructions have been explained to the patient.  The patient, or the person responsible for the patient, fully understands these instructions   YOU MAY TAKE YOUR WELLBUTRIN, LEXAPRO AND TRAMADOL TOMORROW ON 11/18/20 @ 9:30AM OR THEREAFTER.

## 2020-11-17 NOTE — Progress Notes (Signed)
Pt reports she has been off of her Tramadol, Lexapro, and Wellbutrin for at least 48 hours.

## 2020-11-20 ENCOUNTER — Other Ambulatory Visit: Payer: Self-pay | Admitting: Gastroenterology

## 2020-11-20 DIAGNOSIS — R1310 Dysphagia, unspecified: Secondary | ICD-10-CM

## 2020-11-24 ENCOUNTER — Other Ambulatory Visit: Payer: Medicare HMO

## 2020-11-27 ENCOUNTER — Other Ambulatory Visit (HOSPITAL_COMMUNITY): Payer: Self-pay

## 2020-11-27 MED ORDER — FUROSEMIDE 40 MG PO TABS: ORAL_TABLET | ORAL | 1 refills | Status: DC

## 2020-11-27 MED ORDER — FUROSEMIDE 40 MG PO TABS: ORAL_TABLET | ORAL | 0 refills | Status: DC

## 2020-12-08 ENCOUNTER — Ambulatory Visit
Admission: RE | Admit: 2020-12-08 | Discharge: 2020-12-08 | Disposition: A | Discharge status: Home / Self Care | Payer: Medicare HMO | Source: Ambulatory Visit | Attending: Gastroenterology | Admitting: Gastroenterology

## 2020-12-08 DIAGNOSIS — R1310 Dysphagia, unspecified: Secondary | ICD-10-CM

## 2020-12-19 ENCOUNTER — Other Ambulatory Visit (HOSPITAL_COMMUNITY): Payer: Self-pay | Admitting: Cardiology

## 2021-01-12 ENCOUNTER — Ambulatory Visit (INDEPENDENT_AMBULATORY_CARE_PROVIDER_SITE_OTHER): Payer: Medicare HMO

## 2021-01-12 DIAGNOSIS — I428 Other cardiomyopathies: Secondary | ICD-10-CM | POA: Diagnosis not present

## 2021-01-12 LAB — CUP PACEART REMOTE DEVICE CHECK
Battery Remaining Longevity: 108 mo
Battery Remaining Percentage: 100 %
Brady Statistic RA Percent Paced: 0 %
Brady Statistic RV Percent Paced: 0 %
Date Time Interrogation Session: 20220530005100
HighPow Impedance: 80 Ohm
Implantable Lead Implant Date: 20180823
Implantable Lead Implant Date: 20180823
Implantable Lead Implant Date: 20180823
Implantable Lead Location: 753858
Implantable Lead Location: 753859
Implantable Lead Location: 753860
Implantable Lead Model: 293
Implantable Lead Model: 4674
Implantable Lead Model: 7741
Implantable Lead Serial Number: 435016
Implantable Lead Serial Number: 609786
Implantable Lead Serial Number: 872654
Implantable Pulse Generator Implant Date: 20180823
Lead Channel Impedance Value: 394 Ohm
Lead Channel Impedance Value: 644 Ohm
Lead Channel Impedance Value: 676 Ohm
Lead Channel Setting Pacing Amplitude: 2 V
Lead Channel Setting Pacing Amplitude: 2.3 V
Lead Channel Setting Pacing Amplitude: 2.5 V
Lead Channel Setting Pacing Pulse Width: 0.4 ms
Lead Channel Setting Pacing Pulse Width: 0.8 ms
Lead Channel Setting Sensing Sensitivity: 0.5 mV
Lead Channel Setting Sensing Sensitivity: 1 mV
Pulse Gen Serial Number: 186593

## 2021-01-25 ENCOUNTER — Other Ambulatory Visit (HOSPITAL_COMMUNITY): Payer: Self-pay | Admitting: Internal Medicine

## 2021-02-04 ENCOUNTER — Other Ambulatory Visit (HOSPITAL_COMMUNITY): Payer: Self-pay | Admitting: Cardiology

## 2021-02-04 MED ORDER — ENTRESTO 24-26 MG PO TABS: 1.0000 | ORAL_TABLET | Freq: Two times a day (BID) | ORAL | 0 refills | Status: DC

## 2021-02-04 NOTE — Progress Notes (Signed)
Remote ICD transmission.   

## 2021-02-18 ENCOUNTER — Encounter (HOSPITAL_COMMUNITY): Payer: Self-pay | Admitting: Cardiology

## 2021-02-18 ENCOUNTER — Other Ambulatory Visit: Payer: Self-pay

## 2021-02-18 ENCOUNTER — Ambulatory Visit (HOSPITAL_COMMUNITY)
Admission: RE | Admit: 2021-02-18 | Discharge: 2021-02-18 | Disposition: A | Discharge status: Home / Self Care | Payer: Medicare HMO | Source: Ambulatory Visit | Attending: Cardiology | Admitting: Cardiology

## 2021-02-18 VITALS — BP 118/76 | HR 81 | Ht 69.5 in | Wt 299.0 lb

## 2021-02-18 DIAGNOSIS — E669 Obesity, unspecified: Secondary | ICD-10-CM | POA: Insufficient documentation

## 2021-02-18 DIAGNOSIS — I428 Other cardiomyopathies: Secondary | ICD-10-CM | POA: Diagnosis not present

## 2021-02-18 DIAGNOSIS — Z7982 Long term (current) use of aspirin: Secondary | ICD-10-CM | POA: Insufficient documentation

## 2021-02-18 DIAGNOSIS — I340 Nonrheumatic mitral (valve) insufficiency: Secondary | ICD-10-CM | POA: Insufficient documentation

## 2021-02-18 DIAGNOSIS — Z79899 Other long term (current) drug therapy: Secondary | ICD-10-CM | POA: Diagnosis not present

## 2021-02-18 DIAGNOSIS — I5022 Chronic systolic (congestive) heart failure: Secondary | ICD-10-CM

## 2021-02-18 DIAGNOSIS — I10 Essential (primary) hypertension: Secondary | ICD-10-CM

## 2021-02-18 DIAGNOSIS — I447 Left bundle-branch block, unspecified: Secondary | ICD-10-CM | POA: Diagnosis not present

## 2021-02-18 DIAGNOSIS — I099 Rheumatic heart disease, unspecified: Secondary | ICD-10-CM | POA: Diagnosis not present

## 2021-02-18 LAB — BASIC METABOLIC PANEL
Anion gap: 11 (ref 5–15)
BUN: 14 mg/dL (ref 8–23)
CO2: 25 mmol/L (ref 22–32)
Calcium: 9.3 mg/dL (ref 8.9–10.3)
Chloride: 103 mmol/L (ref 98–111)
Creatinine, Ser: 0.93 mg/dL (ref 0.44–1.00)
GFR, Estimated: >60 mL/min (ref >60)
Glucose, Bld: 152 mg/dL — ABNORMAL HIGH (ref 70–99)
Potassium: 3.7 mmol/L (ref 3.5–5.1)
Sodium: 139 mmol/L (ref 135–145)

## 2021-02-18 LAB — LIPID PANEL
Cholesterol: 160 mg/dL (ref 0–200)
HDL: 43 mg/dL (ref >40)
LDL Cholesterol: 85 mg/dL (ref 0–99)
Total CHOL/HDL Ratio: 3.7 RATIO
Triglycerides: 160 mg/dL — ABNORMAL HIGH (ref <150)
VLDL: 32 mg/dL (ref 0–40)

## 2021-02-18 NOTE — Progress Notes (Signed)
Advanced Heart Failure Clinic Note  PCP: Dr. Paulino Rily HF Cardiology: Dr. Elyn Aquas Smith-Graves is a 70 y.o. female with a history of suspected rheumatic fever, nonischemic cardiomyopathy, and severe mitral regurgitation presents for followup of CHF and mitral regurgitation.  Patient had an episode in childhood that was thought to be rheumatic fever.  She had a murmur as a child, but never had an echocardiogram.  Starting last summer, she developed exertional dyspnea. This has been progressive since that time.  She never saw a cardiologist until she was admitted in 4/18 with worsening dyspnea and weight gain.  Weight increased about 35 lbs from last summer.   She was diuresed in the hospital and had an echo and a TEE, showing EF 25-30% with rheumatic-appearing mitral valve and severe mitral regurgitation.  She has been seen by Dr Barry Dienes for evaluation for surgical repair/replacement of the MV.  Given improvement in symptoms, he suggested repeating TEE eventually to reassess.  Therefore, she had repeat RHC and TEE in 8/18 after medical therapy.  EF was 30-35% with septal-lateral dyssynchrony and there was only moderate functional MR.  RHC showed low filling pressures.  She had [employer] CRT-D placed in 8/18.   Echo 09/2017 showed EF up to 55-60% with mild MR.   She presents today for followup of valvular heart disease and CHF.  She has not been seen for a couple of years.  Weight is up about 5 lbs compared to last appointment, she is frustrated due to difficulty losing weight. She has dyspnea only with heavy exertion.  No orthopnea/PND.  No chest pain but she does have belching and occasional heart burn that she attributes to GERD. No lightheadedness.  ECG (personally reviewed): NSR, BiV pacing  [employer] device interrogation: Heart Logic score 2 (suggesting stable volume).   Labs (4/18): BNP 698, K 4.3, creatinine 1.13 => 0.94, BNP 599 Labs (5/18): BNP 206, K 4.2, creatinine  0.87 Labs (7/18): digoxin 0.4, K 3.7, creatinine 0.98 Labs (8/18): K 4.2, creatinine 0.75, hgb 14.1 Labs (1/19): K 4.3, creatinine 0.96 Labs (2/19): K 3.7, creatinine 0.94 Labs (5/19): K 3.8, creatinine 0.91 Labs (4/22): K 4.6, creatinine 0.95, hgb 13.1  PMH: 1. OSA 2. LBBB 3. h/o C difficile 4. Rheumatic fever as a child with possible rheumatic heart disease and severe MR.  - Echo (4/18): EF 25-30%, mild RV dilation with mildly decreased RV systolic function, moderate TR, thickened MV leaflets with 2 MR jets (2 defined areas of malcoaptation), severe MR.   - TEE (4/18): EF 25-30%, mild LV dilation, confirms severe MR with the above features.  - TEE (8/18): EF 30-35%, septal-lateral dyssynchrony, moderate MR (likely functional).  - Echo (1/19): EF 55-60%, mild MR.  5. Chronic systolic CHF: Nonischemic, prior myocarditis + valvular, versus all valvular.  - RHC/LHC (4/18): No significant CAD, severe MR.  Mean RA 18, PA 61/29, mean PCWP 30, CI 1.89.  - EF 30-35% on TEE 8/18.  - RHC (8/18): mean RA 1, PA 32/8, mean PCWP 6, CI 1.83 - [employer] CRT-D placed 8/18.  - Echo (2/19): EF 55-60%, mild MR 6. Anxiety/depression 7. Lung nodule by CT  8. Type 2 diabetes 9. GERD  SH: Married, lives in [place], retired Airline pilot, daughter lives in [place], nonsmoker, no ETOH.   FH: Mother with sudden cardiac death at 71.   Review of systems complete and found to be negative unless listed in HPI.   Current Outpatient Medications  Medication Sig Dispense Refill  acetaminophen (TYLENOL) 650 MG CR tablet Take 1,300 mg by mouth daily as needed for pain.     albuterol (PROVENTIL HFA;VENTOLIN HFA) 108 (90 Base) MCG/ACT inhaler Inhale 2 puffs into the lungs every 6 (six) hours as needed for wheezing or shortness of breath.     allopurinol (ZYLOPRIM) 300 MG tablet Take 300 mg by mouth daily.     ALPRAZolam (XANAX) 0.25 MG tablet Take 0.125 mg by mouth daily as needed for anxiety.      aspirin  EC 81 MG tablet Take 81 mg by mouth every morning.      buPROPion (WELLBUTRIN XL) 150 MG 24 hr tablet TAKE 1 TABLET BY MOUTH EVERY DAY IN THE MORNING     cholecalciferol (VITAMIN D) 1000 UNITS tablet Take 1,000 Units by mouth daily.     colchicine 0.6 MG tablet Take 0.6 mg by mouth daily as needed (FLAREUPS).     Cyanocobalamin (VITAMIN B-12) 2500 MCG SUBL Place 2,500 mcg under the tongue daily.     cyclobenzaprine (FLEXERIL) 10 MG tablet Take 10 mg by mouth 3 (three) times daily as needed.     Dulaglutide (TRULICITY) 1.5 MG/0.5ML SOPN Inject into the skin.     escitalopram (LEXAPRO) 20 MG tablet Take 20 mg by mouth every morning.      fluticasone (FLONASE) 50 MCG/ACT nasal spray Place 1 spray into both nostrils daily as needed for allergies or rhinitis.     fluticasone furoate-vilanterol (BREO ELLIPTA) 200-25 MCG/INH AEPB Inhale 1 puff into the lungs daily.     furosemide (LASIX) 40 MG tablet TAKE 1 TABLET (40 MG TOTAL) BY MOUTH EVERY MORNING AND 0.5 TABLETS (20 MG TOTAL) EVERY EVENING. MUST KEEP PENDING APPT FOR FURTHER REFILLS. 30 tablet 1   levalbuterol (XOPENEX) 0.63 MG/3ML nebulizer solution Take 0.63 mg by nebulization every 4 (four) hours as needed for wheezing or shortness of breath.     levocetirizine (XYZAL) 5 MG tablet Take 5 mg by mouth every evening.     metoprolol succinate (TOPROL-XL) 25 MG 24 hr tablet TAKE 1 TABLET IN THE MORNING AND 2 TABLETS IN THE EVENING (last refill without office visit please call [phone]) 90 tablet 0   naloxone (NARCAN) nasal spray 4 mg/0.1 mL Place 1 spray into the nose as needed. Repeat after 2-3 minute if no or minimal response     sacubitril-valsartan (ENTRESTO) 24-26 MG Take 1 tablet by mouth 2 (two) times daily. Needs appt for further refills 180 tablet 0   simvastatin (ZOCOR) 40 MG tablet Take 1 tablet (40 mg total) by mouth at bedtime. 90 tablet 3   spironolactone (ALDACTONE) 25 MG tablet TAKE 1 TABLET AT BEDTIME 30 tablet 0   traMADol (ULTRAM)  50 MG tablet Take 50 mg by mouth 3 (three) times daily as needed.     No current facility-administered medications for this encounter.   BP 118/76   Pulse 81   Ht 5' 9.5" (1.765 m)   Wt 135.6 kg (299 lb)   LMP  (LMP Unknown)   SpO2 98%   BMI 43.52 kg/m    Wt Readings from Last 3 Encounters:  02/18/21 135.6 kg (299 lb)  07/16/19 133.5 kg (294 lb 6.4 oz)  12/18/18 (!) 137.4 kg (303 lb)   General: NAD Neck: No JVD, no thyromegaly or thyroid nodule.  Lungs: Clear to auscultation bilaterally with normal respiratory effort. CV: Nondisplaced PMI.  Heart regular S1/S2, no S3/S4, no murmur.  No peripheral edema.  No carotid bruit.  Normal pedal pulses.  Abdomen: Soft, nontender, no hepatosplenomegaly, no distention.  Skin: Intact without lesions or rashes.  Neurologic: Alert and oriented x 3.  Psych: Normal affect. Extremities: No clubbing or cyanosis.  HEENT: Normal.   Assessment/Plan: 1. Chronic systolic CHF: EF 40-10% on echo in 4/18, nonischemic cardiomyopathy.  She had severe mitral regurgitation and a rheumatic-appearing mitral valve.  It is hard to know which was the initial problem (cardiomyopathy or mitral regurgitation), and hard to know how related they are.  However, based on most recent echo, suspect that she has a nonischemic cardiomyopathy from some other etiology like myocarditis and has a co-existing rheumatic mitral valve with perhaps worsening of MR from annular dilatation (secondary MR). Followup TEE in 8/18 showed EF 30-35% with dyssynchrony but MR only moderate.  RHC showed low filling pressures.  Now s/p [employer] CRT-D.  Echo 09/2017 with EF up to 55-60% and MR was only mild. NYHA class II symptoms, she is not volume overloaded on exam or by [facility].  - I will arrange for repeat echo.  - Continue Lasix 40 qam/20 qpm.    - Continue spiro 25 mg daily - Continue Entresto 24/26 mg BID - Continue Toprol 25 mg am, 50 mg pm 2. 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  There were 2 jets of MR from 2 defined areas of malcoaptation.  As above, I suspect there was a co-existing nonischemic cardiomyopathy with a component of functional MR from annular dilatation.  Repeat TEE in 8/18 showed moderate MR, and echo 09/2017 with normalization of EF showed only mild MR.  It appears that most of the MR was functional.  - Repeat echo as above.  3. LBBB: s/p CRT device placement. No change.  4. Obesity: Having trouble with weight loss.  - Refer to [facility].   Followup in 6 months if echo is stable.   Marca Ancona 02/18/2021

## 2021-02-18 NOTE — Patient Instructions (Addendum)
EKG done today.  Labs done today. We will contact you only if your labs are abnormal.  No medication changes were made. Please continue all current medications as prescribed.  You have been referred to the healthy weight and wellness clinic. They will contact you to schedule an appointment.  Your physician recommends that you schedule a follow-up appointment soon for an echo and in 6 months for an appointment with Dr. Shirlee Latch  Your physician has requested that you have an echocardiogram. Echocardiography is a painless test that uses sound waves to create images of your heart. It provides your doctor with information about the size and shape of your heart and how well your heart's chambers and valves are working. This procedure takes approximately one hour. There are no restrictions for this procedure.  If you have any questions or concerns before your next appointment please send [place] a message through [place] or call our office at [phone].    TO LEAVE A MESSAGE FOR THE NURSE SELECT OPTION 2, PLEASE LEAVE A MESSAGE INCLUDING: YOUR NAME DATE OF BIRTH CALL BACK NUMBER REASON FOR CALL**this is important as we prioritize the call backs  YOU WILL RECEIVE A CALL BACK THE SAME DAY AS LONG AS YOU CALL BEFORE 4:00 PM   Do the following things EVERYDAY: Weigh yourself in the morning before breakfast. Write it down and keep it in a log. Take your medicines as prescribed Eat low salt foods--Limit salt (sodium) to 2000 mg per day.  Stay as active as you can everyday Limit all fluids for the day to less than 2 liters   At the [facility], you and your health needs are our priority. As part of our continuing mission to provide you with exceptional heart care, we have created designated Provider Care Teams. These Care Teams include your primary Cardiologist (physician) and Advanced Practice Providers (APPs- Physician Assistants and Nurse Practitioners) who all work together to  provide you with the care you need, when you need it.   You may see any of the following providers on your designated Care Team at your next follow up: Dr Arvilla Meres Dr Carron Curie, NP Robbie Lis, [place] Karle Plumber, PharmD   Please be sure to bring in all your medications bottles to every appointment.

## 2021-03-01 ENCOUNTER — Telehealth (HOSPITAL_COMMUNITY): Payer: Self-pay | Admitting: *Deleted

## 2021-03-01 NOTE — Telephone Encounter (Signed)
Ask pharmacist to review her meds to make sure nothing interacts with Paxlovid.

## 2021-03-01 NOTE — Telephone Encounter (Signed)
Pt left VM stating she tested positive for Covid and her PCP prescribed paxlovid. She wanted to make sure Dr.McLean was aware and agreeable with her taking paxlovid.   Routed to Dr.McLean

## 2021-03-02 NOTE — Telephone Encounter (Signed)
She can take Paxlovid, but will have to stop the listed medications while on it and for a day after it is completed.

## 2021-03-03 ENCOUNTER — Ambulatory Visit (HOSPITAL_COMMUNITY): Payer: Medicare HMO

## 2021-03-11 ENCOUNTER — Other Ambulatory Visit (HOSPITAL_COMMUNITY): Payer: Self-pay | Admitting: *Deleted

## 2021-03-11 MED ORDER — METOPROLOL SUCCINATE ER 25 MG PO TB24: ORAL_TABLET | ORAL | 0 refills | Status: DC

## 2021-03-11 MED ORDER — METOPROLOL SUCCINATE ER 25 MG PO TB24: ORAL_TABLET | ORAL | 3 refills | Status: DC

## 2021-03-19 ENCOUNTER — Ambulatory Visit (HOSPITAL_COMMUNITY)
Admission: RE | Admit: 2021-03-19 | Discharge: 2021-03-19 | Disposition: A | Discharge status: Home / Self Care | Payer: Medicare HMO | Source: Ambulatory Visit | Attending: Cardiology | Admitting: Cardiology

## 2021-03-19 ENCOUNTER — Ambulatory Visit
Admission: RE | Admit: 2021-03-19 | Discharge: 2021-03-19 | Disposition: A | Discharge status: Home / Self Care | Payer: Medicare HMO | Source: Ambulatory Visit | Attending: Family Medicine | Admitting: Family Medicine

## 2021-03-19 ENCOUNTER — Other Ambulatory Visit: Payer: Self-pay | Admitting: Family Medicine

## 2021-03-19 ENCOUNTER — Other Ambulatory Visit: Payer: Self-pay

## 2021-03-19 DIAGNOSIS — I5022 Chronic systolic (congestive) heart failure: Secondary | ICD-10-CM | POA: Diagnosis not present

## 2021-03-19 DIAGNOSIS — R059 Cough, unspecified: Secondary | ICD-10-CM

## 2021-03-19 DIAGNOSIS — R0600 Dyspnea, unspecified: Secondary | ICD-10-CM | POA: Diagnosis not present

## 2021-03-19 DIAGNOSIS — E119 Type 2 diabetes mellitus without complications: Secondary | ICD-10-CM | POA: Insufficient documentation

## 2021-03-19 DIAGNOSIS — E669 Obesity, unspecified: Secondary | ICD-10-CM | POA: Insufficient documentation

## 2021-03-19 DIAGNOSIS — I517 Cardiomegaly: Secondary | ICD-10-CM | POA: Diagnosis not present

## 2021-03-19 DIAGNOSIS — I447 Left bundle-branch block, unspecified: Secondary | ICD-10-CM | POA: Diagnosis not present

## 2021-03-19 LAB — ECHOCARDIOGRAM COMPLETE
Area-P 1/2: 4.21 cm2
S' Lateral: 2.6 cm
Single Plane A4C EF: 57.4 %

## 2021-03-19 NOTE — Progress Notes (Signed)
  Echocardiogram 2D Echocardiogram has been performed.  Margaret Vargas 03/19/2021, 3:27 PM

## 2021-03-23 ENCOUNTER — Encounter (HOSPITAL_COMMUNITY): Payer: Self-pay | Admitting: *Deleted

## 2021-04-12 ENCOUNTER — Other Ambulatory Visit: Payer: Self-pay | Admitting: Family Medicine

## 2021-04-12 DIAGNOSIS — R911 Solitary pulmonary nodule: Secondary | ICD-10-CM

## 2021-04-13 ENCOUNTER — Ambulatory Visit (INDEPENDENT_AMBULATORY_CARE_PROVIDER_SITE_OTHER): Payer: Medicare HMO

## 2021-04-13 DIAGNOSIS — I428 Other cardiomyopathies: Secondary | ICD-10-CM

## 2021-04-13 LAB — CUP PACEART REMOTE DEVICE CHECK
Battery Remaining Longevity: 102 mo
Battery Remaining Percentage: 100 %
Brady Statistic RA Percent Paced: 0 %
Brady Statistic RV Percent Paced: 0 %
Date Time Interrogation Session: 20220830011300
HighPow Impedance: 87 Ohm
Implantable Lead Implant Date: 20180823
Implantable Lead Implant Date: 20180823
Implantable Lead Implant Date: 20180823
Implantable Lead Location: 753858
Implantable Lead Location: 753859
Implantable Lead Location: 753860
Implantable Lead Model: 293
Implantable Lead Model: 4674
Implantable Lead Model: 7741
Implantable Lead Serial Number: 435016
Implantable Lead Serial Number: 609786
Implantable Lead Serial Number: 872654
Implantable Pulse Generator Implant Date: 20180823
Lead Channel Impedance Value: 419 Ohm
Lead Channel Impedance Value: 663 Ohm
Lead Channel Impedance Value: 695 Ohm
Lead Channel Setting Pacing Amplitude: 2 V
Lead Channel Setting Pacing Amplitude: 2.3 V
Lead Channel Setting Pacing Amplitude: 2.5 V
Lead Channel Setting Pacing Pulse Width: 0.4 ms
Lead Channel Setting Pacing Pulse Width: 0.8 ms
Lead Channel Setting Sensing Sensitivity: 0.5 mV
Lead Channel Setting Sensing Sensitivity: 1 mV
Pulse Gen Serial Number: 186593

## 2021-04-26 NOTE — Progress Notes (Signed)
Remote ICD transmission.   

## 2021-05-04 ENCOUNTER — Ambulatory Visit
Admission: RE | Admit: 2021-05-04 | Discharge: 2021-05-04 | Disposition: A | Discharge status: Home / Self Care | Payer: Medicare HMO | Source: Ambulatory Visit | Attending: Family Medicine | Admitting: Family Medicine

## 2021-05-04 DIAGNOSIS — R911 Solitary pulmonary nodule: Secondary | ICD-10-CM

## 2021-05-04 MED ORDER — IOPAMIDOL (ISOVUE-370) INJECTION 76%
60.0000 mL | Freq: Once | INTRAVENOUS | Status: AC | PRN
  Administered 2021-05-04: 60 mL via INTRAVENOUS

## 2021-05-05 ENCOUNTER — Other Ambulatory Visit (HOSPITAL_COMMUNITY): Payer: Self-pay | Admitting: Cardiology

## 2021-05-24 ENCOUNTER — Other Ambulatory Visit (HOSPITAL_COMMUNITY): Payer: Self-pay | Admitting: Cardiology

## 2021-07-13 ENCOUNTER — Ambulatory Visit (INDEPENDENT_AMBULATORY_CARE_PROVIDER_SITE_OTHER): Payer: Medicare HMO

## 2021-07-13 DIAGNOSIS — I428 Other cardiomyopathies: Secondary | ICD-10-CM

## 2021-07-13 LAB — CUP PACEART REMOTE DEVICE CHECK
Battery Remaining Longevity: 102 mo
Battery Remaining Percentage: 100 %
Brady Statistic RA Percent Paced: 0 %
Brady Statistic RV Percent Paced: 0 %
Date Time Interrogation Session: 20221129005100
HighPow Impedance: 80 Ohm
Implantable Lead Implant Date: 20180823
Implantable Lead Implant Date: 20180823
Implantable Lead Implant Date: 20180823
Implantable Lead Location: 753858
Implantable Lead Location: 753859
Implantable Lead Location: 753860
Implantable Lead Model: 293
Implantable Lead Model: 4674
Implantable Lead Model: 7741
Implantable Lead Serial Number: 435016
Implantable Lead Serial Number: 609786
Implantable Lead Serial Number: 872654
Implantable Pulse Generator Implant Date: 20180823
Lead Channel Impedance Value: 399 Ohm
Lead Channel Impedance Value: 642 Ohm
Lead Channel Impedance Value: 659 Ohm
Lead Channel Setting Pacing Amplitude: 2 V
Lead Channel Setting Pacing Amplitude: 2.3 V
Lead Channel Setting Pacing Amplitude: 2.5 V
Lead Channel Setting Pacing Pulse Width: 0.4 ms
Lead Channel Setting Pacing Pulse Width: 0.8 ms
Lead Channel Setting Sensing Sensitivity: 0.5 mV
Lead Channel Setting Sensing Sensitivity: 1 mV
Pulse Gen Serial Number: 186593

## 2021-07-14 ENCOUNTER — Encounter (INDEPENDENT_AMBULATORY_CARE_PROVIDER_SITE_OTHER): Payer: Self-pay

## 2021-07-15 ENCOUNTER — Other Ambulatory Visit (HOSPITAL_COMMUNITY): Payer: Self-pay | Admitting: *Deleted

## 2021-07-15 MED ORDER — METOPROLOL SUCCINATE ER 25 MG PO TB24: ORAL_TABLET | ORAL | 3 refills | Status: DC

## 2021-07-22 NOTE — Progress Notes (Signed)
Remote ICD transmission.   

## 2021-08-31 ENCOUNTER — Encounter (INDEPENDENT_AMBULATORY_CARE_PROVIDER_SITE_OTHER): Payer: Self-pay

## 2021-10-12 ENCOUNTER — Ambulatory Visit (INDEPENDENT_AMBULATORY_CARE_PROVIDER_SITE_OTHER): Payer: Medicare HMO

## 2021-10-12 DIAGNOSIS — I428 Other cardiomyopathies: Secondary | ICD-10-CM | POA: Diagnosis not present

## 2021-10-13 LAB — CUP PACEART REMOTE DEVICE CHECK
Battery Remaining Longevity: 102 mo
Battery Remaining Percentage: 100 %
Brady Statistic RA Percent Paced: 0 %
Brady Statistic RV Percent Paced: 0 %
Date Time Interrogation Session: 20230228010100
HighPow Impedance: 80 Ohm
Implantable Lead Implant Date: 20180823
Implantable Lead Implant Date: 20180823
Implantable Lead Implant Date: 20180823
Implantable Lead Location: 753858
Implantable Lead Location: 753859
Implantable Lead Location: 753860
Implantable Lead Model: 293
Implantable Lead Model: 4674
Implantable Lead Model: 7741
Implantable Lead Serial Number: 435016
Implantable Lead Serial Number: 609786
Implantable Lead Serial Number: 872654
Implantable Pulse Generator Implant Date: 20180823
Lead Channel Impedance Value: 415 Ohm
Lead Channel Impedance Value: 635 Ohm
Lead Channel Impedance Value: 649 Ohm
Lead Channel Setting Pacing Amplitude: 2 V
Lead Channel Setting Pacing Amplitude: 2.3 V
Lead Channel Setting Pacing Amplitude: 2.5 V
Lead Channel Setting Pacing Pulse Width: 0.4 ms
Lead Channel Setting Pacing Pulse Width: 0.8 ms
Lead Channel Setting Sensing Sensitivity: 0.5 mV
Lead Channel Setting Sensing Sensitivity: 1 mV
Pulse Gen Serial Number: 186593

## 2021-10-20 NOTE — Progress Notes (Signed)
Remote ICD transmission.   

## 2022-01-11 ENCOUNTER — Ambulatory Visit (INDEPENDENT_AMBULATORY_CARE_PROVIDER_SITE_OTHER): Payer: Medicare HMO

## 2022-01-11 DIAGNOSIS — I5022 Chronic systolic (congestive) heart failure: Secondary | ICD-10-CM

## 2022-01-11 DIAGNOSIS — I428 Other cardiomyopathies: Secondary | ICD-10-CM

## 2022-01-14 LAB — CUP PACEART REMOTE DEVICE CHECK
Battery Remaining Longevity: 96 mo
Battery Remaining Percentage: 100 %
Brady Statistic RA Percent Paced: 0 %
Brady Statistic RV Percent Paced: 0 %
Date Time Interrogation Session: 20230531191100
HighPow Impedance: 89 Ohm
Implantable Lead Implant Date: 20180823
Implantable Lead Implant Date: 20180823
Implantable Lead Implant Date: 20180823
Implantable Lead Location: 753858
Implantable Lead Location: 753859
Implantable Lead Location: 753860
Implantable Lead Model: 293
Implantable Lead Model: 4674
Implantable Lead Model: 7741
Implantable Lead Serial Number: 435016
Implantable Lead Serial Number: 609786
Implantable Lead Serial Number: 872654
Implantable Pulse Generator Implant Date: 20180823
Lead Channel Impedance Value: 430 Ohm
Lead Channel Impedance Value: 657 Ohm
Lead Channel Impedance Value: 722 Ohm
Lead Channel Setting Pacing Amplitude: 2 V
Lead Channel Setting Pacing Amplitude: 2.3 V
Lead Channel Setting Pacing Amplitude: 2.5 V
Lead Channel Setting Pacing Pulse Width: 0.4 ms
Lead Channel Setting Pacing Pulse Width: 0.8 ms
Lead Channel Setting Sensing Sensitivity: 0.5 mV
Lead Channel Setting Sensing Sensitivity: 1 mV
Pulse Gen Serial Number: 186593

## 2022-01-25 NOTE — Progress Notes (Signed)
Remote ICD transmission.   

## 2022-02-26 IMAGING — CT CT CHEST W/ CM
2 of 4 series · 13 of 36 positions shown, 16 images · IV contrast (iopamidol)
Comparison: 01/02/2017

CLINICAL DATA: Former smoker.  Follow-up right upper lobe nodule

EXAM:
CT CHEST WITH CONTRAST
TECHNIQUE: Multidetector CT imaging of the chest was performed during
intravenous contrast administration.
CONTRAST:  60mL E7UOGO-70T IOPAMIDOL (E7UOGO-70T) INJECTION 76%

[Series 2: chest 2.00 br40 s3 · axial · 0.72mm/px · z∈[+1493,+1777]mm · 10 of 168 slices shown, 13 images (1 of 2)]
[im 13/168  mediastinal]
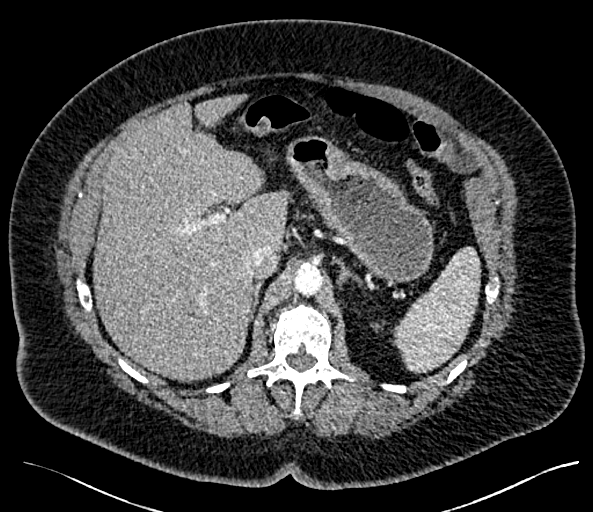
[im 13/168  lung]
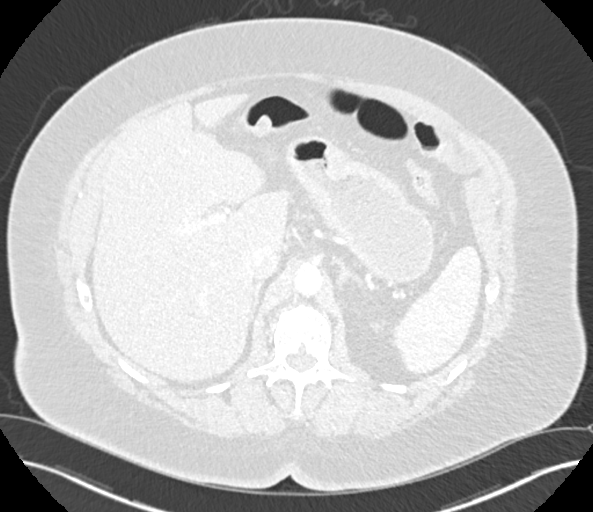
[im 26/168  lung]
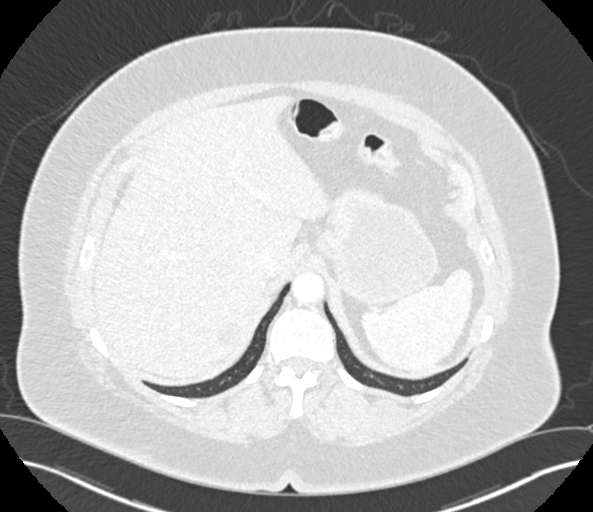
[im 52/168  lung]
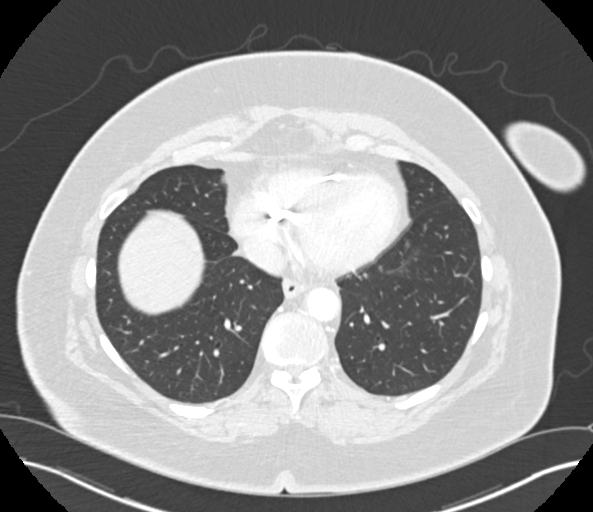
[im 65/168  lung]
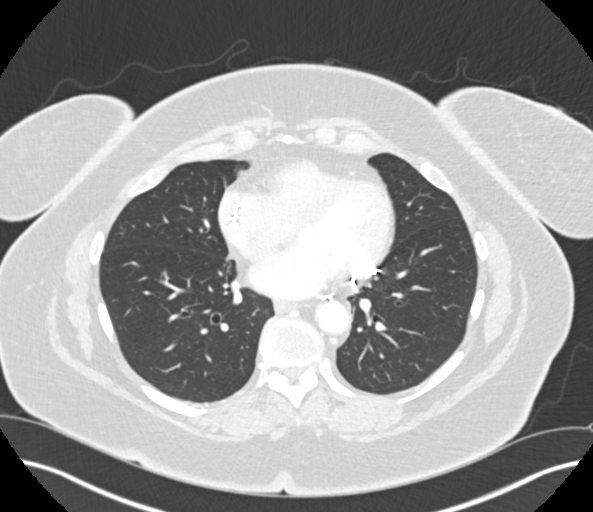
[im 78/168  mediastinal]
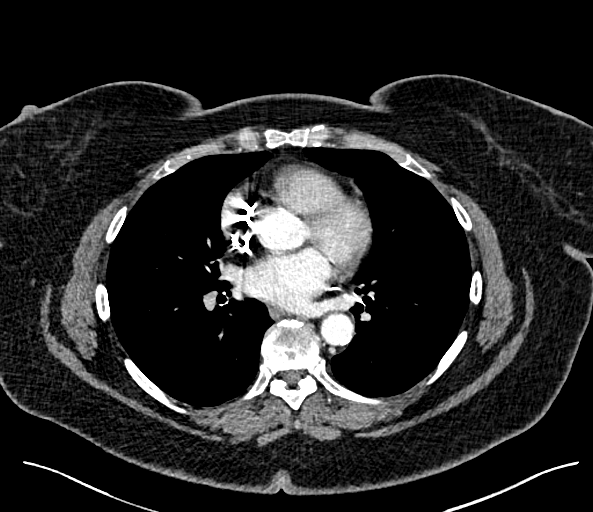
[im 78/168  lung]
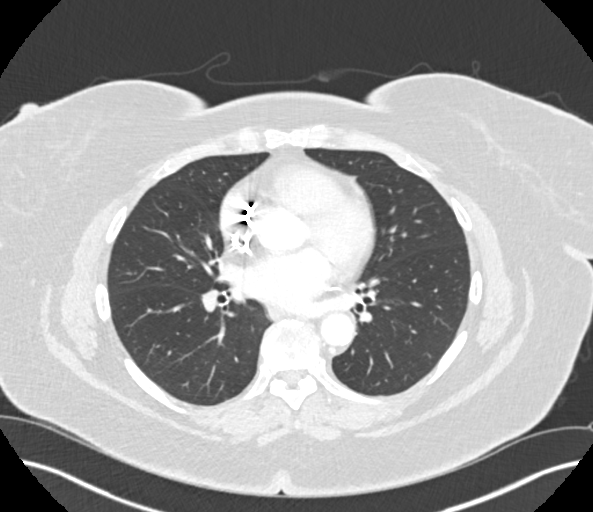
[im 90/168  lung]
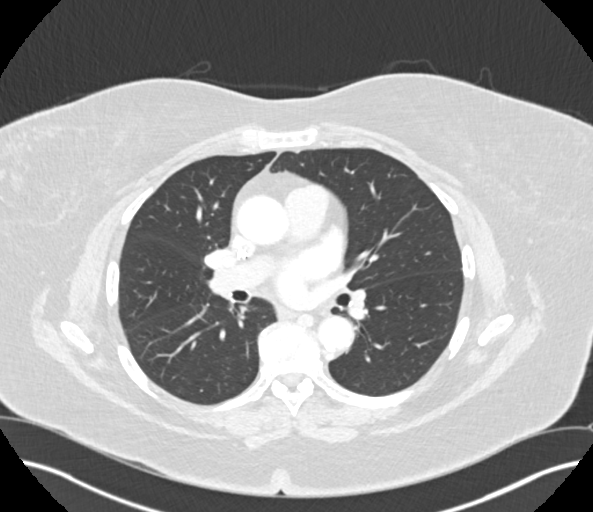
[im 103/168  lung]
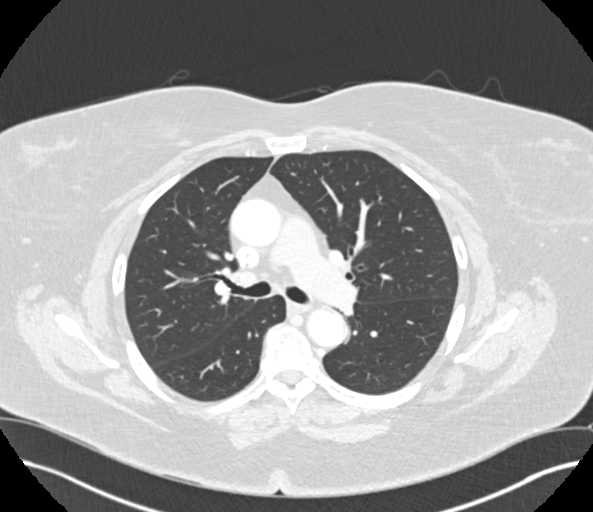
[im 129/168  lung]
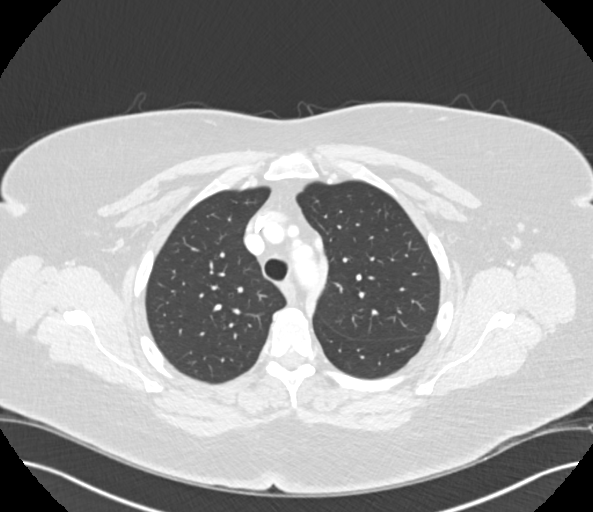
[im 142/168  mediastinal]
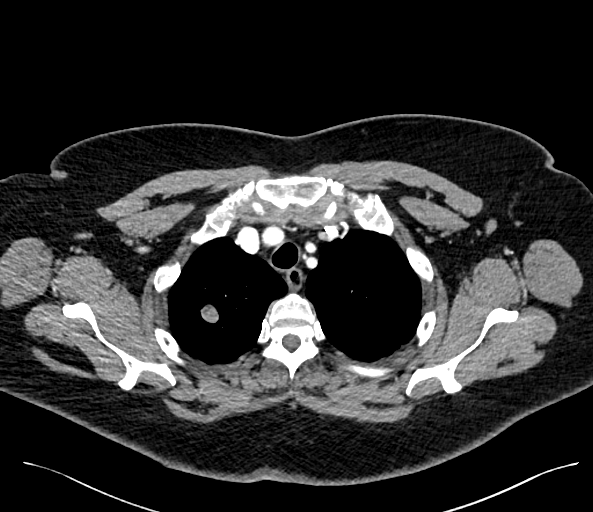
[im 142/168  lung]
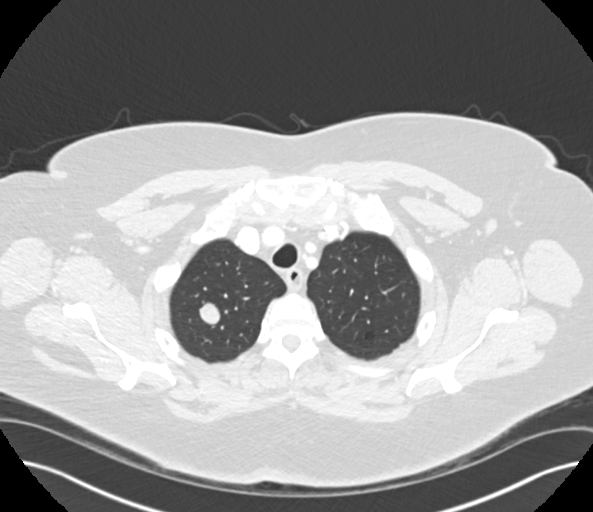
[im 155/168  lung]
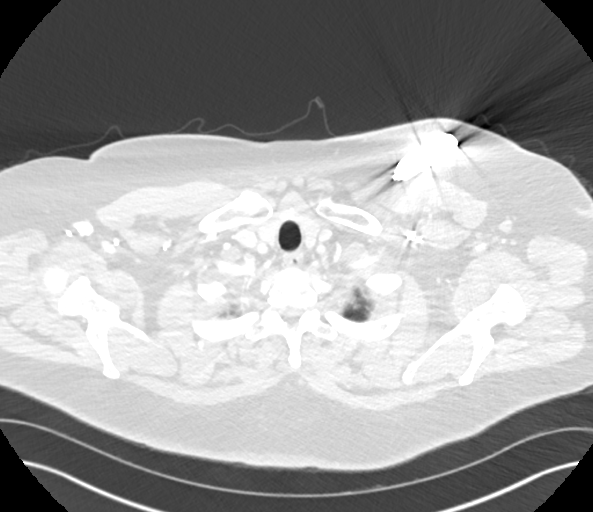

[Series 4: chest 2.00 br40 s3 · coronal · 0.66mm/px · 3 of 183 slices shown (2 of 2)]
[im 37/183  lung]
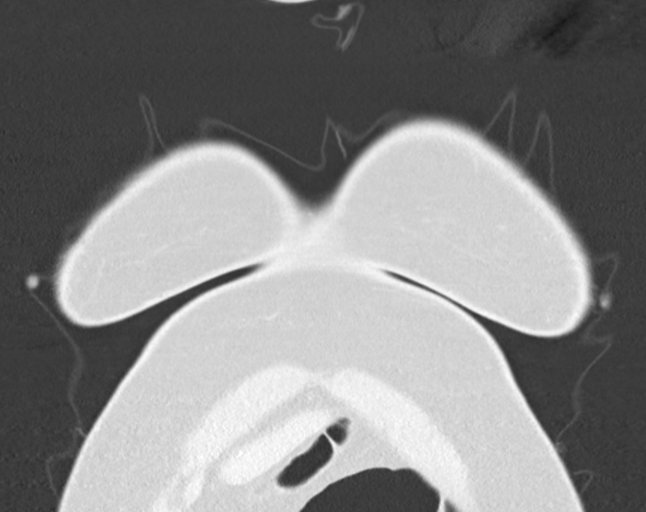
[im 73/183  lung]
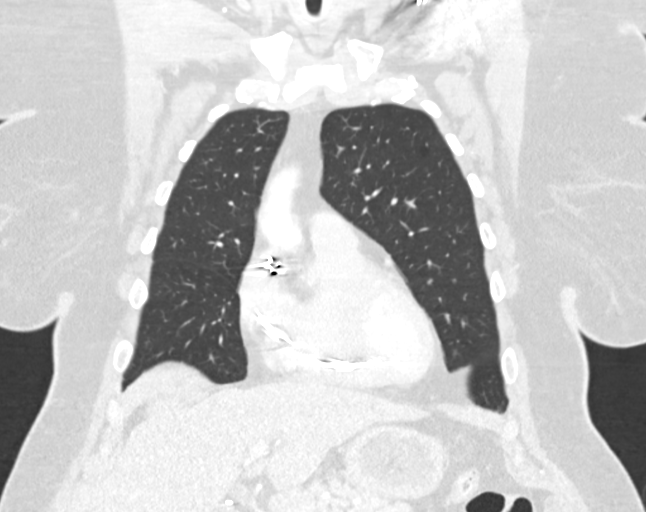
[im 110/183  lung]
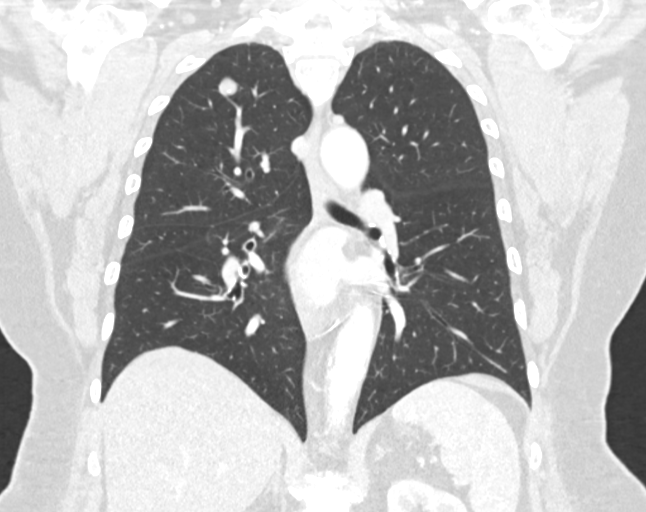

[13 of 36 positions shown; findings below may reference images not displayed]

FINDINGS: Cardiovascular: Scattered aortic and coronary artery calcifications.
Heart is normal size. Aorta is normal caliber. Pacer wires in the
right heart.

Mediastinum/Nodes: No mediastinal, hilar, or axillary adenopathy.
Trachea and esophagus are unremarkable. Thyroid unremarkable.

Lungs/Pleura: Right upper lobe nodule is again noted measuring 16
mm, unchanged since prior study. Areas of central calcification
again noted. No new or enlarging pulmonary nodules. No effusions or
confluent opacities.

Upper Abdomen: Stable low-density lesion in the right hepatic lobe
most compatible with cyst. Prior cholecystectomy. No acute findings.

Musculoskeletal: Chest wall soft tissues are unremarkable. No acute
bony abnormality.
IMPRESSION: 16 mm partially calcified nodule in the right upper lobe is stable
since prior study and most compatible with benign granuloma or
hamartoma. Stability over 3 years is compatible with a benign
process. No additional follow-up is recommended.

Coronary artery disease.

Aortic Atherosclerosis ([id number]).

## 2022-03-15 ENCOUNTER — Other Ambulatory Visit: Payer: Self-pay | Admitting: Family Medicine

## 2022-03-15 DIAGNOSIS — Z1231 Encounter for screening mammogram for malignant neoplasm of breast: Secondary | ICD-10-CM

## 2022-03-17 ENCOUNTER — Other Ambulatory Visit: Payer: Self-pay | Admitting: Family Medicine

## 2022-03-17 DIAGNOSIS — E2839 Other primary ovarian failure: Secondary | ICD-10-CM

## 2022-03-21 ENCOUNTER — Other Ambulatory Visit (HOSPITAL_COMMUNITY): Payer: Self-pay | Admitting: Cardiology

## 2022-03-23 ENCOUNTER — Encounter (INDEPENDENT_AMBULATORY_CARE_PROVIDER_SITE_OTHER): Payer: Self-pay

## 2022-03-29 ENCOUNTER — Ambulatory Visit
Admission: RE | Admit: 2022-03-29 | Discharge: 2022-03-29 | Disposition: A | Discharge status: Home / Self Care | Payer: Medicare HMO | Source: Ambulatory Visit | Attending: Family Medicine | Admitting: Family Medicine

## 2022-03-29 DIAGNOSIS — Z1231 Encounter for screening mammogram for malignant neoplasm of breast: Secondary | ICD-10-CM

## 2022-03-31 ENCOUNTER — Other Ambulatory Visit: Payer: Self-pay | Admitting: Family Medicine

## 2022-03-31 DIAGNOSIS — R928 Other abnormal and inconclusive findings on diagnostic imaging of breast: Secondary | ICD-10-CM

## 2022-04-11 ENCOUNTER — Other Ambulatory Visit: Payer: Self-pay | Admitting: Family Medicine

## 2022-04-11 ENCOUNTER — Ambulatory Visit
Admission: RE | Admit: 2022-04-11 | Discharge: 2022-04-11 | Disposition: A | Discharge status: Home / Self Care | Payer: Medicare HMO | Source: Ambulatory Visit | Attending: Family Medicine | Admitting: Family Medicine

## 2022-04-11 DIAGNOSIS — R928 Other abnormal and inconclusive findings on diagnostic imaging of breast: Secondary | ICD-10-CM

## 2022-04-11 DIAGNOSIS — N6489 Other specified disorders of breast: Secondary | ICD-10-CM

## 2022-04-14 ENCOUNTER — Ambulatory Visit (INDEPENDENT_AMBULATORY_CARE_PROVIDER_SITE_OTHER): Payer: Medicare HMO

## 2022-04-14 DIAGNOSIS — I428 Other cardiomyopathies: Secondary | ICD-10-CM | POA: Diagnosis not present

## 2022-04-18 LAB — CUP PACEART REMOTE DEVICE CHECK
Battery Remaining Longevity: 90 mo
Battery Remaining Percentage: 100 %
Brady Statistic RA Percent Paced: 0 %
Brady Statistic RV Percent Paced: 0 %
Date Time Interrogation Session: 20230831072700
HighPow Impedance: 88 Ohm
Implantable Lead Implant Date: 20180823
Implantable Lead Implant Date: 20180823
Implantable Lead Implant Date: 20180823
Implantable Lead Location: 753858
Implantable Lead Location: 753859
Implantable Lead Location: 753860
Implantable Lead Model: 293
Implantable Lead Model: 4674
Implantable Lead Model: 7741
Implantable Lead Serial Number: 435016
Implantable Lead Serial Number: 609786
Implantable Lead Serial Number: 872654
Implantable Pulse Generator Implant Date: 20180823
Lead Channel Impedance Value: 439 Ohm
Lead Channel Impedance Value: 665 Ohm
Lead Channel Impedance Value: 682 Ohm
Lead Channel Setting Pacing Amplitude: 2 V
Lead Channel Setting Pacing Amplitude: 2.3 V
Lead Channel Setting Pacing Amplitude: 2.5 V
Lead Channel Setting Pacing Pulse Width: 0.4 ms
Lead Channel Setting Pacing Pulse Width: 0.8 ms
Lead Channel Setting Sensing Sensitivity: 0.5 mV
Lead Channel Setting Sensing Sensitivity: 1 mV
Pulse Gen Serial Number: 186593

## 2022-04-22 ENCOUNTER — Ambulatory Visit
Admission: RE | Admit: 2022-04-22 | Discharge: 2022-04-22 | Disposition: A | Discharge status: Home / Self Care | Payer: Medicare HMO | Source: Ambulatory Visit | Attending: Family Medicine | Admitting: Family Medicine

## 2022-04-22 DIAGNOSIS — N6489 Other specified disorders of breast: Secondary | ICD-10-CM

## 2022-05-02 ENCOUNTER — Ambulatory Visit: Payer: Self-pay | Admitting: Surgery

## 2022-05-02 ENCOUNTER — Telehealth: Payer: Self-pay | Admitting: *Deleted

## 2022-05-02 DIAGNOSIS — N6489 Other specified disorders of breast: Secondary | ICD-10-CM

## 2022-05-02 NOTE — Telephone Encounter (Signed)
   Pre-operative Risk Assessment    Patient Name: Margaret Vargas  DOB: May 31, 1951 MRN: [id number]      Request for Surgical Clearance    Procedure:   LUMPECTOMY SURGERY  Date of Surgery:  Clearance TBD                                 Surgeon:  Erroll Luna, MD Surgeon's Group or Practice Name:  [facility] Phone number:  [phone] Fax number:  [phone]   Type of Clearance Requested:   - Medical  - Pharmacy:  Hold Aspirin NOT INDICATED   Type of Anesthesia:  General    Additional requests/questions:    Astrid Divine   05/02/2022, 12:46 PM

## 2022-05-03 ENCOUNTER — Other Ambulatory Visit (HOSPITAL_COMMUNITY): Payer: Self-pay

## 2022-05-03 DIAGNOSIS — I5022 Chronic systolic (congestive) heart failure: Secondary | ICD-10-CM

## 2022-05-03 MED ORDER — SPIRONOLACTONE 25 MG PO TABS: 25.0000 mg | ORAL_TABLET | Freq: Every day | ORAL | 0 refills | Status: DC

## 2022-05-05 NOTE — Progress Notes (Signed)
Remote ICD transmission.   

## 2022-05-06 ENCOUNTER — Telehealth: Payer: Self-pay | Admitting: *Deleted

## 2022-05-06 NOTE — Telephone Encounter (Signed)
Pt agreeable to plan of care for tele pre op appt 05/11/22 @ 23 pm. Med rec and consent are done.       Patient Consent for Virtual Visit        Margaret Vargas has provided verbal consent on 05/06/2022 for a virtual visit (video or telephone).   CONSENT FOR VIRTUAL VISIT FOR:  Margaret Vargas  By participating in this virtual visit I agree to the following:  I hereby voluntarily request, consent and authorize [facility] and its employed or contracted physicians, physician assistants, nurse practitioners or other licensed health care professionals (the Practitioner), to provide me with telemedicine health care services (the "Services") as deemed necessary by the treating Practitioner. I acknowledge and consent to receive the Services by the Practitioner via telemedicine. I understand that the telemedicine visit will involve communicating with the Practitioner through live audiovisual communication technology and the disclosure of certain medical information by electronic transmission. I acknowledge that I have been given the opportunity to request an in-person assessment or other available alternative prior to the telemedicine visit and am voluntarily participating in the telemedicine visit.  I understand that I have the right to withhold or withdraw my consent to the use of telemedicine in the course of my care at any time, without affecting my right to future care or treatment, and that the Practitioner or I may terminate the telemedicine visit at any time. I understand that I have the right to inspect all information obtained and/or recorded in the course of the telemedicine visit and may receive copies of available information for a reasonable fee.  I understand that some of the potential risks of receiving the Services via telemedicine include:  Delay or interruption in medical evaluation due to technological equipment failure or disruption; Information transmitted may not  be sufficient (e.g. poor resolution of images) to allow for appropriate medical decision making by the Practitioner; and/or  In rare instances, security protocols could fail, causing a breach of personal health information.  Furthermore, I acknowledge that it is my responsibility to provide information about my medical history, conditions and care that is complete and accurate to the best of my ability. I acknowledge that Practitioner's advice, recommendations, and/or decision may be based on factors not within their control, such as incomplete or inaccurate data provided by me or distortions of diagnostic images or specimens that may result from electronic transmissions. I understand that the practice of medicine is not an exact science and that Practitioner makes no warranties or guarantees regarding treatment outcomes. I acknowledge that a copy of this consent can be made available to me via my patient portal ([facility]), or I can request a printed copy by calling the office of [facility].    I understand that my insurance will be billed for this visit.   I have read or had this consent read to me. I understand the contents of this consent, which adequately explains the benefits and risks of the Services being provided via telemedicine.  I have been provided ample opportunity to ask questions regarding this consent and the Services and have had my questions answered to my satisfaction. I give my informed consent for the services to be provided through the use of telemedicine in my medical care

## 2022-05-06 NOTE — Telephone Encounter (Signed)
   Name: Margaret Vargas  DOB: [birth year]  MRN: [id number]  Primary Cardiologist: None  Chart reviewed as part of pre-operative protocol coverage. Because of Margaret Vargas's past medical history and time since last visit, she will require a follow-up telephone visit in order to better assess preoperative cardiovascular risk.  Pre-op covering staff: - Please schedule appointment and call patient to inform them. If patient already had an upcoming appointment within acceptable timeframe, please add "pre-op clearance" to the appointment notes so provider is aware. - Please contact requesting surgeon's office via preferred method (i.e, phone, fax) to inform them of need for appointment prior to surgery.  Okay to hold plavix x 7 days prior to procedure per protocol.   Elgie Collard, PA-C  05/06/2022, 8:17 AM

## 2022-05-06 NOTE — Telephone Encounter (Signed)
Pt agreeable to plan of care for tele pre op appt 05/11/22 @ 23 pm. Med rec and consent are done.

## 2022-05-09 ENCOUNTER — Other Ambulatory Visit (HOSPITAL_COMMUNITY): Payer: Self-pay | Admitting: *Deleted

## 2022-05-09 MED ORDER — ENTRESTO 24-26 MG PO TABS: 1.0000 | ORAL_TABLET | Freq: Two times a day (BID) | ORAL | 0 refills | Status: DC

## 2022-05-11 ENCOUNTER — Ambulatory Visit: Payer: Medicare HMO

## 2022-05-11 NOTE — Telephone Encounter (Signed)
   Name: Margaret Vargas  DOB: May 21, 1951  MRN: [id number]  Primary Cardiologist: None  Chart reviewed as part of pre-operative protocol coverage. Because of [name]'s past medical history and time since last visit, she will require a follow-up in-office visit in order to better assess preoperative cardiovascular risk.  Pre-op covering staff: - Please schedule appointment and call patient to inform them. If patient already had an upcoming appointment within acceptable timeframe, please add "pre-op clearance" to the appointment notes so provider is aware. - Please contact requesting surgeon's office via preferred method (i.e, phone, fax) to inform them of need for appointment prior to surgery.   [name], NP  05/11/2022, 2:00 PM

## 2022-05-12 ENCOUNTER — Ambulatory Visit: Payer: Medicare HMO | Attending: Physician Assistant | Admitting: Physician Assistant

## 2022-05-12 ENCOUNTER — Encounter: Payer: Self-pay | Admitting: Physician Assistant

## 2022-05-12 VITALS — BP 126/72 | HR 79 | Ht 69.0 in | Wt 286.2 lb

## 2022-05-12 DIAGNOSIS — I099 Rheumatic heart disease, unspecified: Secondary | ICD-10-CM

## 2022-05-12 DIAGNOSIS — I5022 Chronic systolic (congestive) heart failure: Secondary | ICD-10-CM | POA: Diagnosis not present

## 2022-05-12 DIAGNOSIS — I447 Left bundle-branch block, unspecified: Secondary | ICD-10-CM

## 2022-05-12 DIAGNOSIS — Z01810 Encounter for preprocedural cardiovascular examination: Secondary | ICD-10-CM

## 2022-05-12 NOTE — Patient Instructions (Signed)
Medication Instructions:  Your physician recommends that you continue on your current medications as directed. Please refer to the Current Medication list given to you today.  *If you need a refill on your cardiac medications before your next appointment, please call your pharmacy*   Lab Work: None If you have labs (blood work) drawn today and your tests are completely normal, you will receive your results only by: [facility] (if you have MyChart) OR A paper copy in the mail If you have any lab test that is abnormal or we need to change your treatment, we will call you to review the results.   Follow-Up: At [facility], you and your health needs are our priority.  As part of our continuing mission to provide you with exceptional heart care, we have created designated Provider Care Teams.  These Care Teams include your primary Cardiologist (physician) and Advanced Practice Providers (APPs -  Physician Assistants and Nurse Practitioners) who all work together to provide you with the care you need, when you need it.  Your next appointment:   3 month(s)  The format for your next appointment:   In Person  Provider:   Dr Aundra Dubin  Important Information About [facility]

## 2022-05-12 NOTE — Progress Notes (Signed)
Office Visit    Patient Name: Margaret Vargas Date of Encounter: 05/12/2022  PCP:  Margaret Vargas, [facility]  Cardiologist:  Margaret Champagne, MD  Advanced Practice Provider:  No care team member to display Electrophysiologist:  Margaret Peru, MD   HPI    Margaret Vargas is a 71 y.o. female with a past medical history significant for suspected rheumatic fever, nonischemic cardiomyopathy, and severe mitral regurgitation seen by the heart failure clinic presents today for preop clearance.  Patient had episode in her childhood that was thought to be rheumatic fever.  She had a murmur as a child, but never had an echocardiogram.  In the summer 2021 she developed exertional dyspnea.  This has been progressive ever since.  She saw a cardiologist and was admitted 4/18 with worsening dyspnea and weight gain.  Weight increased about 35 pounds from that summer.  She is diuresed in the hospital and had echocardiogram and TEE, showing EF 25 to 30% with rheumatic appearing mitral valve and severe mitral valve regurgitation.  She was seen by Dr. Roxy Vargas for evaluation for surgical repair/replacement of the MV.  Given improvement in symptoms, he suggested repeating TEE eventually to reassess.  Therefore, she had repeat RHC and TEE in 8/18 after medical therapy.  EF was 30 to 35% with septal lateral dyssynchrony and there was only moderate functional MR.  RHC showed low filling pressures.  She has a Chemical engineer CRT-D in place which was implanted 8/18.  Echocardiogram 09/2017 showed EF up to 55 to 60% with mild MR.  She was last seen by Dr. Aundra Vargas 02/2021 for follow-up.  We have been up about 5 pounds compared to her previous appointment.  She has been frustrated and had difficulty losing weight.  She had dyspnea with heavy exertion.  No chest pain but did endorse belching and occasional heartburn that she attributed to GERD.  No lightheadedness.  Today, she presents  for preop clearance.  She tells me that she has had no issues with her heart.  She denies chest pain, shortness of breath, lower extremity edema.  She tells me she has some back issues that bother her from time to time.  She has no trouble with walking 1-2 blocks and has no issues with stairs.  She does all her own housework.  For this reason she is scored a 5.07 METS on the DASI.  This exceeds the minimum 4 METS requirement.  She can stop her aspirin 7 days prior to the procedure.  Please resume medically safe to do so.  Reports no shortness of breath nor dyspnea on exertion. Reports no chest pain, pressure, or tightness. No edema, orthopnea, PND. Reports no palpitations.    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   Heart murmur    "when I was a child"   High cholesterol    History of blood transfusion    "w/a back OR"   Hypertension    /notes 11/16/2016   Incidental lung nodule, greater  than or equal to 18mm 01/02/2017   Probable granuloma right upper lobe - needs f/u imaging   Pre-diabetes    Sleep apnea    "need a mask/tests" (11/16/2016)   Stroke (HCC) 05/2008   "mini stroke"; denies residual on 11/16/2016   Thyroid nodule    TIA (transient ischemic attack) "several"   Past Surgical History:  Procedure Laterality Date   BACK SURGERY     BIV ICD INSERTION CRT-D N/A 04/06/2017   Procedure: BIV ICD INSERTION CRT-D;  Surgeon: Margaret Maw, MD;  Location: [facility] INVASIVE CV LAB;  Service: Cardiovascular;  Laterality: N/A;   INNER EAR SURGERY Right 1980s   INSERTION OF ICD  04/06/2017   biv   LAPAROSCOPIC CHOLECYSTECTOMY   1990s   NEUROPLASTY / TRANSPOSITION MEDIAN NERVE AT CARPAL TUNNEL Left ~ 2010   POSTERIOR FUSION LUMBAR SPINE  2005   RIGHT HEART CATH N/A 03/27/2017   Procedure: RIGHT HEART CATH;  Surgeon: Margaret Morale, MD;  Location: [facility] INVASIVE CV LAB;  Service: Cardiovascular;  Laterality: N/A;   RIGHT OOPHORECTOMY Right 1990s   RIGHT/LEFT HEART CATH AND CORONARY ANGIOGRAPHY N/A 11/21/2016   Procedure: Right/Left Heart Cath and Coronary Angiography;  Surgeon: Margaret Hazel, MD;  Location: [facility] INVASIVE CV LAB;  Service: Cardiovascular;  Laterality: N/A;   TEAR DUCT PROBING Bilateral 2000s   TEE WITHOUT CARDIOVERSION N/A 11/22/2016   Procedure: TRANSESOPHAGEAL ECHOCARDIOGRAM (TEE);  Surgeon: Margaret Nose, MD;  Location: [facility] ENDOSCOPY;  Service: Cardiovascular;  Laterality: N/A;   TEE WITHOUT CARDIOVERSION N/A 03/29/2017   Procedure: TRANSESOPHAGEAL ECHOCARDIOGRAM (TEE);  Surgeon: Margaret Morale, MD;  Location: [facility] ENDOSCOPY;  Service: Cardiovascular;  Laterality: N/A;   VAGINAL HYSTERECTOMY  1989    Allergies  Allergies  Allergen Reactions   Other Other (See Comments)    Bandaids and some adhesives cause red skin. (pls use paper tape)   Metaxalone     Other reaction(s): lightheadedness   Adhesive [Tape] Itching and Rash   Morphine And Related Itching    EKGs/Labs/Other Studies Reviewed:   The following studies were reviewed today:  Echocardiogram 09/26/2017   Study Conclusions   - Left ventricle: The cavity size was mildly dilated. Systolic    function was normal. The estimated ejection fraction was in the    range of 55% to 60%. Wall motion was normal; there were no    regional wall motion abnormalities. There was an increased    relative contribution of atrial contraction to ventricular    filling. Doppler parameters are consistent with abnormal left    ventricular relaxation (grade 1 diastolic dysfunction).  - Mitral valve: There was mild regurgitation.   Impressions:    - normal VF with grade 1 DD, mild LVE, mild MR and TR, pacer wire    in RV and RA. Compared to prior echo, LVF and MR have improved    with CRT. The findings indicate borderline septal-lateral left    ventricular wall dyssynchrony.   -------------------------------------------------------------------  Labs, prior tests, procedures, and surgery:  Currently implanted device: implantable cardioverter defibrillator.   -------------------------------------------------------------------  Study data:  Comparison was made to the study of 11/17/2016.  Study  status:  Routine.  Procedure:  The patient reported no pain pre or  post test. Transthoracic echocardiography. Image quality was  adequate.  Study completion:  There were no complications.  Transthoracic echocardiography.  M-mode, complete 2D, spectral  Doppler, and color Doppler.  Birthdate:  Patient birthdate:  Aug 30, 1950.  Age:  Patient is 71 yr old.  Sex:  Gender: female.  BMI: 39.5 kg/m^2.  Blood pressure:     120/82  Patient status:  Outpatient.  Study date:  Study date: 09/26/2017. Study time: 09:21  AM.  Location:  Echo laboratory.   -------------------------------------------------------------------   -------------------------------------------------------------------  Left ventricle:  The cavity size was mildly dilated. Systolic  function was normal. The estimated ejection fraction was in the  range of 55% to 60%. Wall motion was normal; there were no regional  wall motion abnormalities. There was an increased relative  contribution of atrial contraction to ventricular filling. Doppler  parameters are consistent with abnormal left ventricular relaxation  (grade 1 diastolic dysfunction).   -------------------------------------------------------------------  Aortic valve:   Trileaflet; normal thickness leaflets. Mobility was  not restricted.  Doppler:  Transvalvular velocity was within the  normal range. There was no  stenosis. There was no regurgitation.     -------------------------------------------------------------------  Aorta:  Aortic root: The aortic root was normal in size.   -------------------------------------------------------------------  Mitral valve:   Structurally normal valve.   Mobility was not  restricted.  Doppler:  Transvalvular velocity was within the normal  range. There was no evidence for stenosis. There was mild  regurgitation.   -------------------------------------------------------------------  Left atrium:  The atrium was normal in size.   -------------------------------------------------------------------  Right ventricle:  The cavity size was normal. Wall thickness was  normal. Pacer wire or catheter noted in right ventricle. Systolic  function was normal.   -------------------------------------------------------------------  Pulmonic valve:    Structurally normal valve.   Cusp separation was  normal.  Doppler:  Transvalvular velocity was within the normal  range. There was no evidence for stenosis. There was no  regurgitation.   -------------------------------------------------------------------  Tricuspid valve:   Structurally normal valve.    Doppler:  Transvalvular velocity was within the normal range. There was mild  regurgitation.   -------------------------------------------------------------------  Pulmonary artery:   The main pulmonary artery was normal-sized.  Systolic pressure was within the normal range.   -------------------------------------------------------------------  Right atrium:  The atrium was normal in size.   -------------------------------------------------------------------  Pericardium:  There was no pericardial effusion.   -------------------------------------------------------------------  Systemic veins:  Inferior vena cava: The vessel was normal in size.   EKG:  EKG is  ordered today.  The ekg ordered today demonstrates  normal sinus rhythm, ventricular pacemaker present, rate 79 bpm  Recent Labs: No results found for requested labs within last 365 days.  Recent Lipid Panel    Component Value Date/Time   CHOL 160 02/18/2021 1035   TRIG 160 (H) 02/18/2021 1035   HDL 43 02/18/2021 1035   CHOLHDL 3.7 02/18/2021 1035   VLDL 32 02/18/2021 1035   LDLCALC 85 02/18/2021 1035    Home Medications   Current Meds  Medication Sig   acetaminophen (TYLENOL) 650 MG CR tablet Take 1,300 mg by mouth daily as needed for pain.   albuterol (PROVENTIL HFA;VENTOLIN HFA) 108 (90 Base) MCG/ACT inhaler Inhale 2 puffs into the lungs every 6 (six) hours as needed for wheezing or shortness of breath.   allopurinol (ZYLOPRIM) 300 MG tablet Take 300 mg by mouth daily.   ALPRAZolam (XANAX) 0.25 MG tablet Take 0.125 mg by mouth daily as needed for anxiety.    aspirin EC 81 MG tablet Take 81 mg by mouth every morning.    buPROPion (WELLBUTRIN XL) 150 MG 24 hr tablet TAKE 1 TABLET BY MOUTH EVERY DAY IN THE MORNING   cholecalciferol (VITAMIN  D) 1000 UNITS tablet Take 1,000 Units by mouth daily.   colchicine 0.6 MG tablet Take 0.6 mg by mouth daily as needed (FLAREUPS).   Cyanocobalamin (VITAMIN B-12) 2500 MCG SUBL Place 2,500 mcg under the tongue daily.   cyclobenzaprine (FLEXERIL) 10 MG tablet Take 10 mg by mouth 3 (three) times daily as needed.   Dulaglutide (TRULICITY) 1.5 MG/0.5ML SOPN Inject into the skin.   escitalopram (LEXAPRO) 20 MG tablet Take 20 mg by mouth every morning.    fluticasone (FLONASE) 50 MCG/ACT nasal spray Place 1 spray into both nostrils daily as needed for allergies or rhinitis.   fluticasone furoate-vilanterol (BREO ELLIPTA) 200-25 MCG/INH AEPB Inhale 1 puff into the lungs daily.   furosemide (LASIX) 40 MG tablet Take 1 tablet (40 mg total) by mouth every morning AND 0.5 tablets (20 mg total) every evening.   levalbuterol (XOPENEX) 0.63 MG/3ML nebulizer solution Take 0.63 mg by nebulization every 4 (four)  hours as needed for wheezing or shortness of breath.   levocetirizine (XYZAL) 5 MG tablet Take 5 mg by mouth every evening.   metoprolol succinate (TOPROL-XL) 25 MG 24 hr tablet Please call to schedule follow up [phone] TAKE 1 TABLET IN THE MORNING AND 2 TABLETS IN THE EVENING   naloxone (NARCAN) nasal spray 4 mg/0.1 mL Place 1 spray into the Vargas as needed. Repeat after 2-3 minute if no or minimal response   sacubitril-valsartan (ENTRESTO) 24-26 MG Take 1 tablet by mouth 2 (two) times daily.   simvastatin (ZOCOR) 40 MG tablet Take 1 tablet (40 mg total) by mouth at bedtime.   spironolactone (ALDACTONE) 25 MG tablet Take 1 tablet (25 mg total) by mouth at bedtime.   traMADol (ULTRAM) 50 MG tablet Take 50 mg by mouth 3 (three) times daily as needed.     Review of Systems      All other systems reviewed and are otherwise negative except as noted above.  Physical Exam    VS:  BP 126/72   Pulse 79   Ht 5\' 9"  (1.753 m)   Wt 286 lb 3.2 oz (129.8 kg)   LMP  (LMP Unknown)   SpO2 95%   BMI 42.26 kg/m  , BMI Body mass index is 42.26 kg/m.  Wt Readings from Last 3 Encounters:  05/12/22 286 lb 3.2 oz (129.8 kg)  02/18/21 299 lb (135.6 kg)  07/16/19 294 lb 6.4 oz (133.5 kg)     GEN: Well nourished, well developed, in no acute distress. HEENT: normal. Neck: Supple, no JVD, carotid bruits, or masses. Cardiac: RRR, no murmurs, rubs, or gallops. No clubbing, cyanosis, edema.  Radials/PT 2+ and equal bilaterally.  Respiratory:  Respirations regular and unlabored, clear to auscultation bilaterally. GI: Soft, nontender, nondistended. MS: No deformity or atrophy. Skin: Warm and dry, no rash. Neuro:  Strength and sensation are intact. Psych: Normal affect.  Assessment & Plan     Preop Clearance  Ms. Smith-Graves's perioperative risk of a major cardiac event is 0.9% according to the Revised Cardiac Risk Index (RCRI).  Therefore, she is at low risk for perioperative complications.   Her  functional capacity is good at 5.07 METs according to the [facility] Activity Status Index (DASI). Recommendations: According to ACC/AHA guidelines, no further cardiovascular testing needed.  The patient may proceed to surgery at acceptable risk.   Antiplatelet and/or Anticoagulation Recommendations: Aspirin can be held for 7 days prior to her surgery.  Please resume Aspirin post operatively when it is felt to be safe  from a bleeding standpoint.   2.  Chronic systolic CHF -Euvolemic on exam today -Continue current medication regimen  3. Rheumatic heart disease -Asymptomatic at this time  4.  LBBB -stable    Disposition: Follow up 3 months with Marca Ancona, MD or APP.  Signed, Sharlene Dory, PA-C 05/12/2022, 4:42 PM [facility]

## 2022-05-12 NOTE — Telephone Encounter (Signed)
Patient already has an appointment scheduled on 9/28 with Nicholes Rough, PA-C at 3:10 pm.

## 2022-05-13 ENCOUNTER — Other Ambulatory Visit (INDEPENDENT_AMBULATORY_CARE_PROVIDER_SITE_OTHER): Payer: Medicare HMO

## 2022-05-13 DIAGNOSIS — I5022 Chronic systolic (congestive) heart failure: Secondary | ICD-10-CM | POA: Diagnosis not present

## 2022-05-18 ENCOUNTER — Other Ambulatory Visit: Payer: Self-pay | Admitting: Surgery

## 2022-05-18 DIAGNOSIS — N6489 Other specified disorders of breast: Secondary | ICD-10-CM

## 2022-05-19 ENCOUNTER — Other Ambulatory Visit (HOSPITAL_COMMUNITY): Payer: Self-pay | Admitting: Cardiology

## 2022-06-07 ENCOUNTER — Encounter (HOSPITAL_COMMUNITY): Payer: Self-pay | Admitting: Anesthesiology

## 2022-06-07 NOTE — Progress Notes (Signed)
Patient's chart reviewed with Dr Lissa Hoard and due to pt hx of NICM, CHF, severe mitral regurg who now has AICD/pacemaker, will need to be done at [facility]. Ivy at Dr [employer] office notified.

## 2022-06-13 ENCOUNTER — Encounter: Payer: Self-pay | Admitting: Internal Medicine

## 2022-06-13 ENCOUNTER — Ambulatory Visit
Admission: RE | Admit: 2022-06-13 | Discharge: 2022-06-13 | Disposition: A | Discharge status: Home / Self Care | Payer: Medicare HMO | Source: Ambulatory Visit | Attending: Surgery | Admitting: Surgery

## 2022-06-13 ENCOUNTER — Ambulatory Visit: Payer: Medicare HMO | Attending: Internal Medicine | Admitting: Internal Medicine

## 2022-06-13 VITALS — BP 120/80 | HR 76 | Ht 67.5 in | Wt 287.0 lb

## 2022-06-13 DIAGNOSIS — N6489 Other specified disorders of breast: Secondary | ICD-10-CM

## 2022-06-13 DIAGNOSIS — I428 Other cardiomyopathies: Secondary | ICD-10-CM | POA: Diagnosis not present

## 2022-06-13 DIAGNOSIS — Z95810 Presence of automatic (implantable) cardiac defibrillator: Secondary | ICD-10-CM | POA: Diagnosis not present

## 2022-06-13 DIAGNOSIS — Z01818 Encounter for other preprocedural examination: Secondary | ICD-10-CM

## 2022-06-13 NOTE — Progress Notes (Signed)
Anesthesia Chart Review: Same day workup  Follows cardiology for history of nonischemic cardiomyopathy, left bundle branch block, severe mitral regurgitation.  Initially found to have EF 25 to 30% with rheumatic appearing mitral valve and severe mitral regurgitation during admission in 2018.  She was seen by Dr. Cornelius Moras for evaluation for surgical repair/replacement of the MV.  Given improvement in symptoms, he suggested repeating TEE eventually to reassess.  Therefore, she had repeat RHC and TEE after medical therapy.  EF was 30 to 35% with septal lateral dyssynchrony and there was only moderate functional MR.  RHC showed low filling pressures.  She has a Environmental manager CRT-D in place which was implanted 8/18.  Echocardiogram 09/2017 showed EF up to 55 to 60% with mild MR. She was seen by Jari Favre, PA-C 05/12/2022 for preop evaluation.  Per note, "Margaret Vargas's perioperative risk of a major cardiac event is 0.9% according to the Revised Cardiac Risk Index (RCRI).  Therefore, she is at low risk for perioperative complications.   Her functional capacity is good at 5.07 METs according to the [facility] Activity Status Index (DASI). Recommendations: According to ACC/AHA guidelines, no further cardiovascular testing needed.  The patient may proceed to surgery at acceptable risk.  Antiplatelet and/or Anticoagulation Recommendations: Aspirin can be held for 7 days prior to her surgery.  Please resume Aspirin post operatively when it is felt to be safe from a bleeding standpoint."  Patient was seen by her EP cardiologist Dr. Ladona Ridgel 06/13/2022 and surgery was discussed.  Per note, "Preop eval - her device is working normally. She is low risk for breast surgery. She may proceed with radiation seed placement. She will need her device deactivated before surgery if electrocautery is to be used."  OSA, not on CPAP.  Non-insulin-dependent DM 2.  Patient is on GLP-1 agonist Mounjaro, last dose 06/12/2022.  Patient will  need day of surgery labs and evaluation.  EKG 05/12/2022: Ventricular pacing.  Rate 79.  Perioperative prescription for implanted cardiac device programming per progress note 06/13/2022: Device Information:   Clinic EP Physician:  Lewayne Bunting, MD    Device Type:  Defibrillator Manufacturer and Phone #:  [employer]: [phone] Pacemaker Dependent?:  No. Date of Last Device Check:  06/13/2022     Normal Device Function?:  Yes.     Electrophysiologist's Recommendations:   Have magnet available. Provide continuous ECG monitoring when magnet is used or reprogramming is to be performed.  Procedure should not interfere with device function.  No device programming or magnet placement needed.    TTE 03/19/2021:  1. Left ventricular ejection fraction, by estimation, is 55 to 60%. The  left ventricle has normal function. The left ventricle has no regional  wall motion abnormalities. There is mild left ventricular hypertrophy.  Left ventricular diastolic parameters  are consistent with Grade I diastolic dysfunction (impaired relaxation).   2. Right ventricular systolic function is normal. The right ventricular  size is normal. There is normal pulmonary artery systolic pressure. The  estimated right ventricular systolic pressure is 20.3 mmHg.   3. The mitral valve is normal in structure. Mild mitral valve  regurgitation. No evidence of mitral stenosis.   4. The aortic valve was not well visualized. Aortic valve regurgitation  is not visualized. No aortic stenosis is present.   5. The inferior vena cava is normal in size with greater than 50%  respiratory variability, suggesting right atrial pressure of 3 mmHg.   Right/left heart cath 11/21/2016: 1. No angiographic evidence  of CAD 2. Severe LV systolic dysfunction 3. Severe mitral regurgitation   Recommendations: Would consider TEE to better define mitral valve disease. She will likely need surgical consideration for MV  repair/replacement.     Wynonia Musty [facility] Short Stay Center/Anesthesiology Phone [phone] 06/13/2022 4:06 PM

## 2022-06-13 NOTE — Progress Notes (Signed)
PERIOPERATIVE PRESCRIPTION FOR IMPLANTED CARDIAC DEVICE PROGRAMMING  Patient Information: Name:  Margaret Vargas  DOB:  1951/07/30  MRN:  [id number]   Planned Procedure:  left breast seed lumpectomy  Surgeon:  Dr. Brantley Stage  Date of Procedure:  06/14/22  Cautery will be used.  Position during surgery:  supine   Device Information:  Clinic EP Physician:  Cristopher Peru, MD   Device Type:  Defibrillator Manufacturer and Phone #:  [employer]: [phone] Pacemaker Dependent?:  No. Date of Last Device Check:  06/13/2022 Normal Device Function?:  Yes.    Electrophysiologist's Recommendations:  Have magnet available. Provide continuous ECG monitoring when magnet is used or reprogramming is to be performed.  Procedure should not interfere with device function.  No device programming or magnet placement needed.  Per Device Clinic Standing Orders, Damian Leavell, RN  3:12 PM 06/13/2022

## 2022-06-13 NOTE — Progress Notes (Signed)
Patient's 06/14/22 surgery was cancelled.  BS Rep Heron Sabins was informed via phone.  LMOM at [phone].  PCP - Dr Jonathon Jordan  Cardiologist - Dr Loralie Champagne Electrophysiologist - Dr Cristopher Peru  Chest x-ray - n/a EKG - 05/12/22 Stress Test - n/a ECHO - 03/19/21 Cardiac Cath - 03/27/17  [facility] Scientific - Yes. Last remote check was on 04/14/22.  Rep Heron Sabins [phone] informed of surgery date and time.  Sleep Study -  Yes CPAP - does not use CPAP  Patient takes Mounjaro weekly on Sundays.  Last dose was on 06/12/22.  Do not take Trulicity on the morning of surgery.  If your blood sugar is less than 70 mg/dL, you will need to treat for low blood sugar: Treat a low blood sugar (less than 70 mg/dL) with  cup of clear juice (cranberry or apple), 4 glucose tablets, OR glucose gel. Recheck blood sugar in 15 minutes after treatment (to make sure it is greater than 70 mg/dL). If your blood sugar is not greater than 70 mg/dL on recheck, call [phone] for further instructions.  Aspirin Instructions: Follow your surgeon's instructions on when to stop aspirin prior to surgery,  If no instructions were given by your surgeon then you will need to call the office for those instructions.  ERAS: Clear liquids til 11:30 PM DOS.  Anesthesia review: Yes  STOP now taking any Aspirin (unless otherwise instructed by your surgeon), Aleve, Naproxen, Ibuprofen, Motrin, Advil, Goody's, BC's, all herbal medications, fish oil, and all vitamins.   Coronavirus Screening Do you have any of the following symptoms:  Cough yes/no: No Fever (>100.76F)  yes/no: No Runny nose yes/no: No Sore throat yes/no: No Difficulty breathing/shortness of breath  yes/no: No  Have you traveled in the last 14 days and where? yes/no: No  Patient verbalized understanding of instructions that were given via phone.

## 2022-06-13 NOTE — Patient Instructions (Signed)
Medication Instructions:  Your physician recommends that you continue on your current medications as directed. Please refer to the Current Medication list given to you today.  *If you need a refill on your cardiac medications before your next appointment, please call your pharmacy*  Lab Work: None ordered.  If you have labs (blood work) drawn today and your tests are completely normal, you will receive your results only by: [place] (if you have MyChart) OR A paper copy in the mail If you have any lab test that is abnormal or we need to change your treatment, we will call you to review the results.  Testing/Procedures: None ordered.  Follow-Up: At [facility], you and your health needs are our priority.  As part of our continuing mission to provide you with exceptional heart care, we have created designated Provider Care Teams.  These Care Teams include your primary Cardiologist (physician) and Advanced Practice Providers (APPs -  Physician Assistants and Nurse Practitioners) who all work together to provide you with the care you need, when you need it.  We recommend signing up for the patient portal called "MyChart".  Sign up information is provided on this After Visit Summary.  MyChart is used to connect with patients for Virtual Visits (Telemedicine).  Patients are able to view lab/test results, encounter notes, upcoming appointments, etc.  Non-urgent messages can be sent to your provider as well.   To learn more about what you can do with MyChart, go to [email or web address].    Your next appointment:   1 year(s)  The format for your next appointment:   In Person  Provider:   Cristopher Peru, MD{or one of the following Advanced Practice Providers on your designated Care Team:   Caroljean Monsivais, [place] Legrand Como "Jonni Sanger" Chalmers Cater, [place]  Remote monitoring is used to monitor your ICD from home. This monitoring reduces the number of office visits required to check your device to one  time per year. It allows [place] to keep an eye on the functioning of your device to ensure it is working properly. You are scheduled for a device check from home on 07/14/22. You may send your transmission at any time that day. If you have a wireless device, the transmission will be sent automatically. After your physician reviews your transmission, you will receive a postcard with your next transmission date.  Important Information About Sugar      11

## 2022-06-13 NOTE — Progress Notes (Signed)
HPI Margaret Vargas returns today for ongoing evaluation and management of her chronic systolic heart failure status post insertion of a biventricular ICD. She is a very pleasant 71 year old woman with a long-standing nonischemic cardiomyopathy and mitral regurgitation. She has severe left ventricular dysfunction. She underwent biventricular ICD insertion 5 yearsago. I have not seen her in almost 5 years. She has been diagnosed with a pre-malignant breast lesion for which radiation seeds are needed prior to partial mastectomy. She was seen in our office a month ago and released to proceed with surgery. We are asked to comment on her device prior to seed insertion.  Allergies  Allergen Reactions   Other Other (See Comments)    Bandaids and some adhesives cause red skin. (pls use paper tape)   Metaxalone     Other reaction(s): lightheadedness   Adhesive [Tape] Itching and Rash   Morphine And Related Itching     Current Outpatient Medications  Medication Sig Dispense Refill   acetaminophen (TYLENOL) 650 MG CR tablet Take 1,300 mg by mouth daily as needed for pain.     albuterol (PROVENTIL HFA;VENTOLIN HFA) 108 (90 Base) MCG/ACT inhaler Inhale 2 puffs into the lungs every 6 (six) hours as needed for wheezing or shortness of breath.     allopurinol (ZYLOPRIM) 300 MG tablet Take 300 mg by mouth daily.     ALPRAZolam (XANAX) 0.25 MG tablet Take 0.125 mg by mouth daily as needed for anxiety.      aspirin EC 81 MG tablet Take 81 mg by mouth every morning.      buPROPion (WELLBUTRIN XL) 150 MG 24 hr tablet TAKE 1 TABLET BY MOUTH EVERY DAY IN THE MORNING     cholecalciferol (VITAMIN D) 1000 UNITS tablet Take 1,000 Units by mouth daily.     colchicine 0.6 MG tablet Take 0.6 mg by mouth daily as needed (FLAREUPS).     Cyanocobalamin (VITAMIN B-12) 2500 MCG SUBL Place 2,500 mcg under the tongue daily.     cyclobenzaprine (FLEXERIL) 10 MG tablet Take 10 mg by mouth 3 (three) times daily as  needed.     Dulaglutide (TRULICITY) 1.5 MG/0.5ML SOPN Inject into the skin.     escitalopram (LEXAPRO) 20 MG tablet Take 20 mg by mouth every morning.      fluticasone (FLONASE) 50 MCG/ACT nasal spray Place 1 spray into both nostrils daily as needed for allergies or rhinitis.     fluticasone furoate-vilanterol (BREO ELLIPTA) 200-25 MCG/INH AEPB Inhale 1 puff into the lungs daily.     furosemide (LASIX) 40 MG tablet TAKE 1 TABLET EVERY MORNING AND ONE-HALF TABLET (20 MG) EVERY EVENING 135 tablet 3   levalbuterol (XOPENEX) 0.63 MG/3ML nebulizer solution Take 0.63 mg by nebulization every 4 (four) hours as needed for wheezing or shortness of breath.     levocetirizine (XYZAL) 5 MG tablet Take 5 mg by mouth every evening.     metoprolol succinate (TOPROL-XL) 25 MG 24 hr tablet Please call to schedule follow up [phone] TAKE 1 TABLET IN THE MORNING AND 2 TABLETS IN THE EVENING 180 tablet 5   MOUNJARO 7.5 MG/0.5ML Pen Inject into the skin. Takes on Sunday     naloxone [facility]) nasal spray 4 mg/0.1 mL Place 1 spray into the nose as needed. Repeat after 2-3 minute if no or minimal response     sacubitril-valsartan (ENTRESTO) 24-26 MG Take 1 tablet by mouth 2 (two) times daily. 14 tablet 0   simvastatin (ZOCOR) 40  MG tablet Take 1 tablet (40 mg total) by mouth at bedtime. 90 tablet 3   spironolactone (ALDACTONE) 25 MG tablet Take 1 tablet (25 mg total) by mouth at bedtime. 90 tablet 0   traMADol (ULTRAM) 50 MG tablet Take 50 mg by mouth 3 (three) times daily as needed.     No current facility-administered medications for this visit.     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   Past Surgical History:  Procedure Laterality Date   BACK SURGERY     BIV ICD INSERTION CRT-D N/A 04/06/2017   Procedure: BIV ICD INSERTION CRT-D;  Surgeon: Marinus Maw, MD;  Location: [facility] INVASIVE CV LAB;  Service: Cardiovascular;  Laterality: N/A;   INNER EAR SURGERY Right 1980s   INSERTION OF ICD  04/06/2017   biv   LAPAROSCOPIC CHOLECYSTECTOMY  1990s   NEUROPLASTY / TRANSPOSITION MEDIAN NERVE AT CARPAL TUNNEL Left ~ 2010   POSTERIOR FUSION LUMBAR SPINE  2005   RIGHT HEART CATH N/A 03/27/2017   Procedure: RIGHT HEART CATH;  Surgeon: Laurey Morale, MD;  Location: [facility] INVASIVE CV LAB;  Service: Cardiovascular;  Laterality: N/A;   RIGHT OOPHORECTOMY Right 1990s   RIGHT/LEFT HEART CATH AND CORONARY ANGIOGRAPHY N/A 11/21/2016   Procedure: Right/Left Heart Cath and Coronary Angiography;  Surgeon: Kathleene Hazel, MD;  Location: [facility] INVASIVE CV LAB;  Service: Cardiovascular;  Laterality: N/A;   TEAR DUCT PROBING Bilateral 2000s   TEE WITHOUT CARDIOVERSION N/A 11/22/2016   Procedure: TRANSESOPHAGEAL ECHOCARDIOGRAM (TEE);  Surgeon: Chrystie Nose, MD;  Location: [facility] ENDOSCOPY;  Service: Cardiovascular;  Laterality: N/A;   TEE WITHOUT CARDIOVERSION N/A 03/29/2017   Procedure: TRANSESOPHAGEAL ECHOCARDIOGRAM (TEE);  Surgeon: Laurey Morale, MD;  Location: [facility] ENDOSCOPY;  Service: Cardiovascular;  Laterality: N/A;   VAGINAL  HYSTERECTOMY  1989     Family History  Problem Relation Age of Onset   Hypertension Mother    Breast cancer Neg Hx      Social History   Socioeconomic History   Marital status: Married    Spouse name: Not on file   Number of children: Not on file   Years of education: Not on file   Highest education level: Not on file  Occupational History   Not on file  Tobacco Use   Smoking status: Former    Packs/day: 0.75    Years: 13.00  Total pack years: 9.75    Types: Cigarettes    Quit date: 74    Years since quitting: 34.8   Smokeless tobacco: Never  Vaping Use   Vaping Use: Never used  Substance and Sexual Activity   Alcohol use: Yes    Comment: 11/16/2016 "might have 3-4 drinks/month"   Drug use: No    Comment: 11/16/2016 "I smoked marijuana in college"   Sexual activity: Not on file  Other Topics Concern   Not on file  Social History Narrative   Not on file   Social Determinants of Health   Financial Resource Strain: Not on file  Food Insecurity: Not on file  Transportation Needs: Not on file  Physical Activity: Not on file  Stress: Not on file  Social Connections: Not on file  Intimate Partner Violence: Not on file     BP 120/80   Pulse 76   Ht 5' 7.5" (1.715 m)   Wt 287 lb (130.2 kg)   LMP  (LMP Unknown)   BMI 44.29 kg/m   Physical Exam:  Well appearing NAD HEENT: Unremarkable Neck:  No JVD, no thyromegally Lymphatics:  No adenopathy Back:  No CVA tenderness Lungs:  Clear HEART:  Regular rate rhythm, no murmurs, no rubs, no clicks Abd:  soft, positive bowel sounds, no organomegally, no rebound, no guarding Ext:  2 plus pulses, no edema, no cyanosis, no clubbing Skin:  No rashes no nodules Neuro:  CN II through XII intact, motor grossly intact  DEVICE  Normal device function.  See PaceArt for details.   Assess/Plan:   1. Chronic systolic heart failure - she remains class II. She will continue her current medical therapy. 2. Bi-V ICD - her  [employer] biventricular ICD is working normally. We'll adjust her outputs to maximize battery longevity. 3. Obesity - the patient is exercising regularly. Hopefully she can lose some weight. 4. Preop eval - her device is working normally. She is low risk for breast surgery. She may proceed with radiation seed placement. She will need her device deactivated before surgery if electrocautery is to be used.    Cristopher Peru, M.D.

## 2022-06-14 ENCOUNTER — Ambulatory Visit (HOSPITAL_COMMUNITY): Admission: RE | Admit: 2022-06-14 | Payer: Medicare HMO | Source: Home / Self Care | Admitting: Surgery

## 2022-06-14 SURGERY — BREAST LUMPECTOMY WITH RADIOACTIVE SEED LOCALIZATION
Anesthesia: General | Site: Breast | Laterality: Left

## 2022-06-21 ENCOUNTER — Ambulatory Visit
Admission: RE | Admit: 2022-06-21 | Discharge: 2022-06-21 | Disposition: A | Discharge status: Home / Self Care | Payer: Medicare HMO | Source: Ambulatory Visit | Attending: Surgery | Admitting: Surgery

## 2022-06-21 ENCOUNTER — Other Ambulatory Visit: Payer: Self-pay

## 2022-06-21 ENCOUNTER — Encounter (HOSPITAL_COMMUNITY): Payer: Self-pay | Admitting: Surgery

## 2022-06-21 DIAGNOSIS — N6489 Other specified disorders of breast: Secondary | ICD-10-CM

## 2022-06-21 HISTORY — PX: BREAST BIOPSY: SHX20

## 2022-06-21 NOTE — Progress Notes (Signed)
Spoke with pt for pre-op call. Pt has hx of CHF, nonischemic cardiomyopathy and mitral regurgitation. Pt has an ICD. Pt was originally scheduled for 06/14/22 but moved to 06/22/22. Device orders in chart from last week. It says "have magnet available". I called and notified [place] about pt's surgery. He states he will be here at 3:30 PM to turn off the ICD and will return after surgery to turn it back on. He states he needs to do this due to the fact the surgery is on the left breast.   Pt is a type 2 diabetic. Last A1C per pt was 5.6. She states she does not check her blood sugar at home.   Shower instructions given to pt and she voiced understanding.

## 2022-06-22 ENCOUNTER — Ambulatory Visit
Admission: RE | Admit: 2022-06-22 | Discharge: 2022-06-22 | Disposition: A | Discharge status: Home / Self Care | Payer: Medicare HMO | Source: Ambulatory Visit | Attending: Surgery | Admitting: Surgery

## 2022-06-22 ENCOUNTER — Ambulatory Visit (HOSPITAL_COMMUNITY): Payer: Medicare HMO | Admitting: Anesthesiology

## 2022-06-22 ENCOUNTER — Encounter (HOSPITAL_COMMUNITY)
Admission: RE | Disposition: A | Discharge status: Home / Self Care | Payer: Self-pay | Source: Home / Self Care | Attending: Surgery

## 2022-06-22 ENCOUNTER — Encounter (HOSPITAL_COMMUNITY): Payer: Self-pay | Admitting: Surgery

## 2022-06-22 ENCOUNTER — Other Ambulatory Visit: Payer: Self-pay

## 2022-06-22 ENCOUNTER — Ambulatory Visit (HOSPITAL_COMMUNITY)
Admission: RE | Admit: 2022-06-22 | Discharge: 2022-06-22 | Disposition: A | Discharge status: Home / Self Care | Payer: Medicare HMO | Attending: Surgery | Admitting: Surgery

## 2022-06-22 ENCOUNTER — Ambulatory Visit (HOSPITAL_BASED_OUTPATIENT_CLINIC_OR_DEPARTMENT_OTHER): Payer: Medicare HMO | Admitting: Anesthesiology

## 2022-06-22 DIAGNOSIS — Z950 Presence of cardiac pacemaker: Secondary | ICD-10-CM | POA: Insufficient documentation

## 2022-06-22 DIAGNOSIS — Z7985 Long-term (current) use of injectable non-insulin antidiabetic drugs: Secondary | ICD-10-CM | POA: Diagnosis not present

## 2022-06-22 DIAGNOSIS — Z8249 Family history of ischemic heart disease and other diseases of the circulatory system: Secondary | ICD-10-CM | POA: Insufficient documentation

## 2022-06-22 DIAGNOSIS — E119 Type 2 diabetes mellitus without complications: Secondary | ICD-10-CM | POA: Diagnosis not present

## 2022-06-22 DIAGNOSIS — I110 Hypertensive heart disease with heart failure: Secondary | ICD-10-CM | POA: Diagnosis not present

## 2022-06-22 DIAGNOSIS — Z79899 Other long term (current) drug therapy: Secondary | ICD-10-CM | POA: Insufficient documentation

## 2022-06-22 DIAGNOSIS — Z8673 Personal history of transient ischemic attack (TIA), and cerebral infarction without residual deficits: Secondary | ICD-10-CM | POA: Diagnosis not present

## 2022-06-22 DIAGNOSIS — N6489 Other specified disorders of breast: Secondary | ICD-10-CM | POA: Diagnosis present

## 2022-06-22 DIAGNOSIS — Z87891 Personal history of nicotine dependence: Secondary | ICD-10-CM | POA: Insufficient documentation

## 2022-06-22 DIAGNOSIS — I509 Heart failure, unspecified: Secondary | ICD-10-CM | POA: Insufficient documentation

## 2022-06-22 DIAGNOSIS — G4730 Sleep apnea, unspecified: Secondary | ICD-10-CM | POA: Diagnosis not present

## 2022-06-22 DIAGNOSIS — M1990 Unspecified osteoarthritis, unspecified site: Secondary | ICD-10-CM | POA: Diagnosis not present

## 2022-06-22 DIAGNOSIS — F32A Depression, unspecified: Secondary | ICD-10-CM | POA: Insufficient documentation

## 2022-06-22 HISTORY — PX: BREAST LUMPECTOMY WITH RADIOACTIVE SEED LOCALIZATION: SHX6424

## 2022-06-22 HISTORY — DX: Headache, unspecified: R51.9

## 2022-06-22 HISTORY — DX: Type 2 diabetes mellitus without complications: E11.9

## 2022-06-22 HISTORY — DX: COVID-19: U07.1

## 2022-06-22 LAB — GLUCOSE, CAPILLARY
Glucose-Capillary: 110 mg/dL — ABNORMAL HIGH (ref 70–99)
Glucose-Capillary: 76 mg/dL (ref 70–99)

## 2022-06-22 LAB — BASIC METABOLIC PANEL
Anion gap: 10 (ref 5–15)
BUN: 10 mg/dL (ref 8–23)
CO2: 22 mmol/L (ref 22–32)
Calcium: 9.2 mg/dL (ref 8.9–10.3)
Chloride: 107 mmol/L (ref 98–111)
Creatinine, Ser: 0.83 mg/dL (ref 0.44–1.00)
GFR, Estimated: >60 mL/min (ref >60)
Glucose, Bld: 94 mg/dL (ref 70–99)
Potassium: 4.1 mmol/L (ref 3.5–5.1)
Sodium: 139 mmol/L (ref 135–145)

## 2022-06-22 LAB — CBC
HCT: 38 % (ref 36.0–46.0)
Hemoglobin: 12.8 g/dL (ref 12.0–15.0)
MCH: 32.4 pg (ref 26.0–34.0)
MCHC: 33.7 g/dL (ref 30.0–36.0)
MCV: 96.2 fL (ref 80.0–100.0)
Platelets: 276 10*3/uL (ref 150–400)
RBC: 3.95 MIL/uL (ref 3.87–5.11)
RDW: 13.8 % (ref 11.5–15.5)
WBC: 6.4 10*3/uL (ref 4.0–10.5)
nRBC: 0 % (ref 0.0–0.2)

## 2022-06-22 SURGERY — BREAST LUMPECTOMY WITH RADIOACTIVE SEED LOCALIZATION
Anesthesia: General | Site: Breast | Laterality: Left

## 2022-06-22 MED ORDER — CEFAZOLIN IN SODIUM CHLORIDE 3-0.9 GM/100ML-% IV SOLN
INTRAVENOUS | Status: AC
  Filled 2022-06-22: qty 100

## 2022-06-22 MED ORDER — ROCURONIUM BROMIDE 10 MG/ML (PF) SYRINGE
PREFILLED_SYRINGE | INTRAVENOUS | Status: AC
  Filled 2022-06-22: qty 30

## 2022-06-22 MED ORDER — CHLORHEXIDINE GLUCONATE CLOTH 2 % EX PADS: 6.0000 | MEDICATED_PAD | Freq: Once | CUTANEOUS | Status: DC

## 2022-06-22 MED ORDER — OXYCODONE HCL 5 MG/5ML PO SOLN: 5.0000 mg | Freq: Once | ORAL | Status: DC | PRN

## 2022-06-22 MED ORDER — CHLORHEXIDINE GLUCONATE 0.12 % MT SOLN
OROMUCOSAL | Status: AC
  Administered 2022-06-22: 15 mL via OROMUCOSAL
  Filled 2022-06-22: qty 15

## 2022-06-22 MED ORDER — CEFAZOLIN IN SODIUM CHLORIDE 3-0.9 GM/100ML-% IV SOLN
3.0000 g | INTRAVENOUS | Status: AC
  Administered 2022-06-22: 3 g via INTRAVENOUS

## 2022-06-22 MED ORDER — MIDAZOLAM HCL 2 MG/2ML IJ SOLN
INTRAMUSCULAR | Status: AC
  Filled 2022-06-22: qty 2

## 2022-06-22 MED ORDER — MIDAZOLAM HCL 5 MG/5ML IJ SOLN
INTRAMUSCULAR | Status: DC | PRN
  Administered 2022-06-22: 2 mg via INTRAVENOUS

## 2022-06-22 MED ORDER — ACETAMINOPHEN 160 MG/5ML PO SOLN: 325.0000 mg | ORAL | Status: DC | PRN

## 2022-06-22 MED ORDER — DEXAMETHASONE SODIUM PHOSPHATE 10 MG/ML IJ SOLN
INTRAMUSCULAR | Status: DC | PRN
  Administered 2022-06-22: 10 mg via INTRAVENOUS

## 2022-06-22 MED ORDER — PROPOFOL 10 MG/ML IV BOLUS
INTRAVENOUS | Status: DC | PRN
  Administered 2022-06-22: 200 mg via INTRAVENOUS

## 2022-06-22 MED ORDER — SUGAMMADEX SODIUM 200 MG/2ML IV SOLN
INTRAVENOUS | Status: DC | PRN
  Administered 2022-06-22: 200 mg via INTRAVENOUS

## 2022-06-22 MED ORDER — FENTANYL CITRATE (PF) 100 MCG/2ML IJ SOLN
25.0000 ug | INTRAMUSCULAR | Status: DC | PRN
  Administered 2022-06-22 (×2): 50 ug via INTRAVENOUS

## 2022-06-22 MED ORDER — PHENYLEPHRINE 80 MCG/ML (10ML) SYRINGE FOR IV PUSH (FOR BLOOD PRESSURE SUPPORT)
PREFILLED_SYRINGE | INTRAVENOUS | Status: DC | PRN
  Administered 2022-06-22: 80 ug via INTRAVENOUS

## 2022-06-22 MED ORDER — 0.9 % SODIUM CHLORIDE (POUR BTL) OPTIME
TOPICAL | Status: DC | PRN
  Administered 2022-06-22: 1000 mL

## 2022-06-22 MED ORDER — ONDANSETRON HCL 4 MG/2ML IJ SOLN
INTRAMUSCULAR | Status: DC | PRN
  Administered 2022-06-22: 4 mg via INTRAVENOUS

## 2022-06-22 MED ORDER — CHLORHEXIDINE GLUCONATE 0.12 % MT SOLN
15.0000 mL | OROMUCOSAL | Status: AC
  Filled 2022-06-22: qty 15

## 2022-06-22 MED ORDER — SUGAMMADEX SODIUM 500 MG/5ML IV SOLN
INTRAVENOUS | Status: AC
  Filled 2022-06-22: qty 5

## 2022-06-22 MED ORDER — FENTANYL CITRATE (PF) 100 MCG/2ML IJ SOLN
INTRAMUSCULAR | Status: AC
  Filled 2022-06-22: qty 2

## 2022-06-22 MED ORDER — OXYCODONE HCL 5 MG PO TABS: 5.0000 mg | ORAL_TABLET | Freq: Once | ORAL | Status: DC | PRN

## 2022-06-22 MED ORDER — PROPOFOL 10 MG/ML IV BOLUS
INTRAVENOUS | Status: AC
  Filled 2022-06-22: qty 20

## 2022-06-22 MED ORDER — LIDOCAINE 2% (20 MG/ML) 5 ML SYRINGE
INTRAMUSCULAR | Status: AC
  Filled 2022-06-22: qty 10

## 2022-06-22 MED ORDER — ACETAMINOPHEN 325 MG PO TABS: 325.0000 mg | ORAL_TABLET | ORAL | Status: DC | PRN

## 2022-06-22 MED ORDER — BUPIVACAINE-EPINEPHRINE (PF) 0.25% -1:200000 IJ SOLN
INTRAMUSCULAR | Status: AC
  Filled 2022-06-22: qty 30

## 2022-06-22 MED ORDER — ONDANSETRON HCL 4 MG/2ML IJ SOLN: 4.0000 mg | Freq: Once | INTRAMUSCULAR | Status: DC | PRN

## 2022-06-22 MED ORDER — BUPIVACAINE-EPINEPHRINE 0.25% -1:200000 IJ SOLN
INTRAMUSCULAR | Status: DC | PRN
  Administered 2022-06-22: 20 mL

## 2022-06-22 MED ORDER — LACTATED RINGERS IV SOLN: INTRAVENOUS | Status: DC

## 2022-06-22 MED ORDER — FENTANYL CITRATE (PF) 250 MCG/5ML IJ SOLN
INTRAMUSCULAR | Status: AC
  Filled 2022-06-22: qty 5

## 2022-06-22 MED ORDER — FENTANYL CITRATE (PF) 250 MCG/5ML IJ SOLN
INTRAMUSCULAR | Status: DC | PRN
  Administered 2022-06-22: 50 ug via INTRAVENOUS

## 2022-06-22 MED ORDER — INSULIN ASPART 100 UNIT/ML IJ SOLN: 0.0000 [IU] | INTRAMUSCULAR | Status: DC | PRN

## 2022-06-22 MED ORDER — MEPERIDINE HCL 25 MG/ML IJ SOLN: 6.2500 mg | INTRAMUSCULAR | Status: DC | PRN

## 2022-06-22 MED ORDER — ROCURONIUM BROMIDE 100 MG/10ML IV SOLN
INTRAVENOUS | Status: DC | PRN
  Administered 2022-06-22: 50 mg via INTRAVENOUS

## 2022-06-22 MED ORDER — OXYCODONE HCL 5 MG PO TABS: 5.0000 mg | ORAL_TABLET | Freq: Four times a day (QID) | ORAL | 0 refills | Status: DC | PRN

## 2022-06-22 SURGICAL SUPPLY — 38 items
APPLIER CLIP 9.375 MED OPEN (MISCELLANEOUS)
BAG COUNTER SPONGE SURGICOUNT (BAG) ×1 IMPLANT
BINDER BREAST LRG (GAUZE/BANDAGES/DRESSINGS) IMPLANT
BINDER BREAST XLRG (GAUZE/BANDAGES/DRESSINGS) IMPLANT
BINDER BREAST XXLRG (GAUZE/BANDAGES/DRESSINGS) IMPLANT
CANISTER SUCT 3000ML PPV (MISCELLANEOUS) IMPLANT
CHLORAPREP W/TINT 26 (MISCELLANEOUS) ×1 IMPLANT
CLIP APPLIE 9.375 MED OPEN (MISCELLANEOUS) IMPLANT
COVER PROBE W GEL 5X96 (DRAPES) ×1 IMPLANT
COVER SURGICAL LIGHT HANDLE (MISCELLANEOUS) ×1 IMPLANT
DERMABOND ADVANCED .7 DNX12 (GAUZE/BANDAGES/DRESSINGS) ×1 IMPLANT
DEVICE DUBIN SPECIMEN MAMMOGRA (MISCELLANEOUS) ×1 IMPLANT
DRAPE CHEST BREAST 15X10 FENES (DRAPES) ×1 IMPLANT
ELECT CAUTERY BLADE 6.4 (BLADE) ×1 IMPLANT
ELECT REM PT RETURN 9FT ADLT (ELECTROSURGICAL) ×1
ELECTRODE REM PT RTRN 9FT ADLT (ELECTROSURGICAL) ×1 IMPLANT
GLOVE BIO SURGEON STRL SZ8 (GLOVE) ×1 IMPLANT
GLOVE BIOGEL PI IND STRL 8 (GLOVE) ×1 IMPLANT
GOWN STRL REUS W/ TWL LRG LVL3 (GOWN DISPOSABLE) ×1 IMPLANT
GOWN STRL REUS W/ TWL XL LVL3 (GOWN DISPOSABLE) ×1 IMPLANT
GOWN STRL REUS W/TWL LRG LVL3 (GOWN DISPOSABLE) ×1
GOWN STRL REUS W/TWL XL LVL3 (GOWN DISPOSABLE) ×1
KIT BASIN OR (CUSTOM PROCEDURE TRAY) ×1 IMPLANT
KIT MARKER MARGIN INK (KITS) IMPLANT
LIGHT WAVEGUIDE WIDE FLAT (MISCELLANEOUS) IMPLANT
NDL HYPO 25GX1X1/2 BEV (NEEDLE) IMPLANT
NEEDLE HYPO 25GX1X1/2 BEV (NEEDLE) IMPLANT
NS IRRIG 1000ML POUR BTL (IV SOLUTION) IMPLANT
PACK GENERAL/GYN (CUSTOM PROCEDURE TRAY) ×1 IMPLANT
SUT MNCRL AB 4-0 PS2 18 (SUTURE) ×1 IMPLANT
SUT SILK 2 0 SH (SUTURE) IMPLANT
SUT VIC AB 2-0 SH 27 (SUTURE)
SUT VIC AB 2-0 SH 27XBRD (SUTURE) IMPLANT
SUT VIC AB 3-0 SH 27 (SUTURE)
SUT VIC AB 3-0 SH 27X BRD (SUTURE) IMPLANT
SUT VIC AB 3-0 SH 8-18 (SUTURE) ×1 IMPLANT
SYR CONTROL 10ML LL (SYRINGE) IMPLANT
TOWEL GREEN STERILE FF (TOWEL DISPOSABLE) IMPLANT

## 2022-06-22 NOTE — Progress Notes (Signed)
Margaret Vargas with [employer] made aware of AICD. Stated he would be here at 1515 to turn device off for surgery.

## 2022-06-22 NOTE — Progress Notes (Signed)
Joey here, interogates patients pacemaker

## 2022-06-22 NOTE — Anesthesia Preprocedure Evaluation (Addendum)
Anesthesia Evaluation  Patient identified by MRN, date of birth, ID band Patient awake    Reviewed: Allergy & Precautions, H&P , NPO status , Patient's Chart, lab work & pertinent test results  Airway Mallampati: I  TM Distance: >3 FB Neck ROM: Full    Dental no notable dental hx. (+) Poor Dentition, Missing, Dental Advisory Given, Partial Upper,    Pulmonary sleep apnea and Continuous Positive Airway Pressure Ventilation , former smoker   Pulmonary exam normal breath sounds clear to auscultation       Cardiovascular Exercise Tolerance: Good hypertension, Pt. on medications and Pt. on home beta blockers +CHF  Normal cardiovascular exam+ Cardiac Defibrillator + Valvular Problems/Murmurs MR  Rhythm:Regular Rate:Normal     Neuro/Psych  PSYCHIATRIC DISORDERS Anxiety Depression    TIACVA    GI/Hepatic negative GI ROS, Neg liver ROS,,,  Endo/Other  diabetes    Renal/GU negative Renal ROS  negative genitourinary   Musculoskeletal negative musculoskeletal ROS (+) Arthritis ,    Abdominal   Peds negative pediatric ROS (+)  Hematology negative hematology ROS (+)   Anesthesia Other Findings   Reproductive/Obstetrics negative OB ROS                             Anesthesia Physical Anesthesia Plan  ASA: 3  Anesthesia Plan: General   Post-op Pain Management: Minimal or no pain anticipated   Induction: Intravenous  PONV Risk Score and Plan: 3 and Ondansetron and Dexamethasone  Airway Management Planned: Oral ETT and LMA  Additional Equipment: None  Intra-op Plan:   Post-operative Plan: Extubation in OR  Informed Consent: I have reviewed the patients History and Physical, chart, labs and discussed the procedure including the risks, benefits and alternatives for the proposed anesthesia with the patient or authorized representative who has indicated his/her understanding and acceptance.        Plan Discussed with: CRNA and Surgeon  Anesthesia Plan Comments: (Anesthesia Chart Review: Same day workup   Follows cardiology for history of nonischemic cardiomyopathy, left bundle branch block, severe mitral regurgitation.  Initially found to have EF 25 to 30% with rheumatic appearing mitral valve and severe mitral regurgitation during admission in 2018.  She was seen by Dr. Cornelius Vargas for evaluation for surgical repair/replacement of the MV.  Given improvement in symptoms, he suggested repeating TEE eventually to reassess.  Therefore, she had repeat RHC and TEE after medical therapy.  EF was 30 to 35% with septal lateral dyssynchrony and there was only moderate functional MR.  RHC showed low filling pressures.  She has a Environmental manager CRT-D in place which was implanted 8/18.  Echocardiogram 09/2017 showed EF up to 55 to 60% with mild MR. She was seen by Margaret Favre, PA-C 05/12/2022 for preop evaluation.  Per note, "Margaret Vargas's perioperative risk of a major cardiac event is 0.9% according to the Revised Cardiac Risk Index (RCRI).  Therefore, she is at low risk for perioperative complications.   Her functional capacity is good at 5.07 METs according to the Duke Activity Status Index (DASI). Recommendations: According to ACC/AHA guidelines, no further cardiovascular testing needed.  The patient may proceed to surgery at acceptable risk.  Antiplatelet and/or Anticoagulation Recommendations: Aspirin can be held for 7 days prior to her surgery.  Please resume Aspirin post operatively when it is felt to be safe from a bleeding standpoint."   Patient was seen by her EP cardiologist Dr. Ladona Vargas 06/13/2022 and surgery was  discussed.  Per note, "Preop eval - her device is working normally. She is low risk for breast surgery. She may proceed with radiation seed placement. She will need her device deactivated before surgery if electrocautery is to be used."   OSA, not on CPAP.   Non-insulin-dependent  DM 2.  Patient is on GLP-1 agonist Mounjaro, last dose 06/12/2022.  Perioperative prescription for implanted cardiac device programming per progress note 06/13/2022: Device Information:   Clinic EP Physician:  Margaret Bunting, MD    Device Type:  Defibrillator Manufacturer and Phone #:  [employer]: [phone] Pacemaker Dependent?:  No. Date of Last Device Check:  06/13/2022     Normal Device Function?:  Yes.        EKG 05/12/2022: Ventricular pacing.  Rate 79. Electrophysiologist's Recommendations:    Have magnet available.  Provide continuous ECG monitoring when magnet is used or reprogramming is to be performed.   Procedure should not interfere with device function.  No device programming or magnet placement needed.     TTE 03/19/2021:  1. Left ventricular ejection fraction, by estimation, is 55 to 60%. The  left ventricle has normal function. The left ventricle has no regional  wall motion abnormalities. There is mild left ventricular hypertrophy.  Left ventricular diastolic parameters  are consistent with Grade I diastolic dysfunction (impaired relaxation).   2. Right ventricular systolic function is normal. The right ventricular  size is normal. There is normal pulmonary artery systolic pressure. The  estimated right ventricular systolic pressure is 20.3 mmHg.   3. The mitral valve is normal in structure. Mild mitral valve  regurgitation. No evidence of mitral stenosis.   4. The aortic valve was not well visualized. Aortic valve regurgitation  is not visualized. No aortic stenosis is present.   5. The inferior vena cava is normal in size with greater than 50%  respiratory variability, suggesting right atrial pressure of 3 mmHg.    Right/left heart cath 11/21/2016: 1. No angiographic evidence of CAD 2. Severe LV systolic dysfunction 3. Severe mitral regurgitation )        Anesthesia Quick Evaluation

## 2022-06-22 NOTE — Anesthesia Postprocedure Evaluation (Signed)
Anesthesia Post Note  Patient: Melanye E Smith-Graves  Procedure(s) Performed: LEFT BREAST LUMPECTOMY WITH RADIOACTIVE SEED LOCALIZATION (Left: Breast)     Patient location during evaluation: PACU Anesthesia Type: General Level of consciousness: awake and alert Pain management: pain level controlled Vital Signs Assessment: post-procedure vital signs reviewed and stable Respiratory status: spontaneous breathing, nonlabored ventilation, respiratory function stable and patient connected to nasal cannula oxygen Cardiovascular status: blood pressure returned to baseline and stable Postop Assessment: no apparent nausea or vomiting Anesthetic complications: no  No notable events documented.  Last Vitals:  Vitals:   06/22/22 1800 06/22/22 1815  BP: 133/71 133/71  Pulse: 70 70  Resp: 15 18  Temp:  36.7 C  SpO2: 95% 92%    Last Pain:  Vitals:   06/22/22 1815  TempSrc:   PainSc: 2                  Fenton Candee

## 2022-06-22 NOTE — Discharge Instructions (Signed)
[facility] Office Phone Number [phone]  BREAST BIOPSY/ PARTIAL MASTECTOMY: POST OP INSTRUCTIONS  Always review your discharge instruction sheet given to you by the facility where your surgery was performed.  IF YOU HAVE DISABILITY OR FAMILY LEAVE FORMS, YOU MUST BRING THEM TO THE OFFICE FOR PROCESSING.  DO NOT GIVE THEM TO YOUR DOCTOR.  A prescription for pain medication may be given to you upon discharge.  Take your pain medication as prescribed, if needed.  If narcotic pain medicine is not needed, then you may take acetaminophen (Tylenol) or ibuprofen (Advil) as needed. Take your usually prescribed medications unless otherwise directed If you need a refill on your pain medication, please contact your pharmacy.  They will contact our office to request authorization.  Prescriptions will not be filled after 5pm or on week-ends. You should eat very light the first 24 hours after surgery, such as soup, crackers, pudding, etc.  Resume your normal diet the day after surgery. Most patients will experience some swelling and bruising in the breast.  Ice packs and a good support bra will help.  Swelling and bruising can take several days to resolve.  It is common to experience some constipation if taking pain medication after surgery.  Increasing fluid intake and taking a stool softener will usually help or prevent this problem from occurring.  A mild laxative (Milk of Magnesia or Miralax) should be taken according to package directions if there are no bowel movements after 48 hours. Unless discharge instructions indicate otherwise, you may remove your bandages 24-48 hours after surgery, and you may shower at that time.  You may have steri-strips (small skin tapes) in place directly over the incision.  These strips should be left on the skin for 7-10 days.  If your surgeon used skin glue on the incision, you may shower in 24 hours.  The glue will flake off over the next 2-3 weeks.  Any  sutures or staples will be removed at the office during your follow-up visit. ACTIVITIES:  You may resume regular daily activities (gradually increasing) beginning the next day.  Wearing a good support bra or sports bra minimizes pain and swelling.  You may have sexual intercourse when it is comfortable. You may drive when you no longer are taking prescription pain medication, you can comfortably wear a seatbelt, and you can safely maneuver your car and apply brakes. RETURN TO WORK:  ______________________________________________________________________________________ You should see your doctor in the office for a follow-up appointment approximately two weeks after your surgery.  Your doctor's nurse will typically make your follow-up appointment when she calls you with your pathology report.  Expect your pathology report 2-3 business days after your surgery.  You may call to check if you do not hear from us after three days. OTHER INSTRUCTIONS: _______________________________________________________________________________________________ _____________________________________________________________________________________________________________________________________ _____________________________________________________________________________________________________________________________________ _____________________________________________________________________________________________________________________________________  WHEN TO CALL YOUR DOCTOR: Fever over 101.0 Nausea and/or vomiting. Extreme swelling or bruising. Continued bleeding from incision. Increased pain, redness, or drainage from the incision.  The clinic staff is available to answer your questions during regular business hours.  Please don't hesitate to call and ask to speak to one of the nurses for clinical concerns.  If you have a medical emergency, go to the nearest emergency room or call 911.  A surgeon from [facility] is always on call at the hospital.  For further questions, please visit [email or web address]   

## 2022-06-22 NOTE — Transfer of Care (Signed)
Immediate Anesthesia Transfer of Care Note  Patient: Margaret Vargas  Procedure(s) Performed: LEFT BREAST LUMPECTOMY WITH RADIOACTIVE SEED LOCALIZATION (Left: Breast)  Patient Location: PACU  Anesthesia Type:General  Level of Consciousness: awake and patient cooperative  Airway & Oxygen Therapy: Patient Spontanous Breathing and Patient connected to face mask oxygen  Post-op Assessment: Report given to RN, Post -op Vital signs reviewed and stable, and Patient moving all extremities  Post vital signs: Reviewed and stable  Last Vitals:  Vitals Value Taken Time  BP 136/68 06/22/22 1709  Temp    Pulse 90 06/22/22 1712  Resp 16 06/22/22 1712  SpO2 95 % 06/22/22 1712  Vitals shown include unvalidated device data.  Last Pain:  Vitals:   06/22/22 1340  TempSrc:   PainSc: 0-No pain      Patients Stated Pain Goal: 0 (06/22/22 1340)  Complications: No notable events documented.

## 2022-06-22 NOTE — Anesthesia Procedure Notes (Signed)
Procedure Name: Intubation Date/Time: 06/22/2022 4:25 PM  Performed by: Elvin So, CRNAPre-anesthesia Checklist: Patient identified, Emergency Drugs available, Suction available and Patient being monitored Patient Re-evaluated:Patient Re-evaluated prior to induction Oxygen Delivery Method: Circle System Utilized Preoxygenation: Pre-oxygenation with 100% oxygen Induction Type: IV induction Ventilation: Mask ventilation without difficulty Laryngoscope Size: Mac and 4 Grade View: Grade I Tube type: Oral Tube size: 7.0 mm Number of attempts: 1 Airway Equipment and Method: Stylet and Oral airway Placement Confirmation: ETT inserted through vocal cords under direct vision, positive ETCO2 and breath sounds checked- equal and bilateral Secured at: 23 cm Tube secured with: Tape Dental Injury: Teeth and Oropharynx as per pre-operative assessment

## 2022-06-22 NOTE — Op Note (Signed)
Preoperative diagnosis: Left breast radial scar  Postoperative diagnosis: Same  Procedure: Left breast seed localized lumpectomy  Surgeon: Erroll Luna, MD  Anesthesia: LMA with 0.25% Marcaine with epinephrine  Specimen: Left breast tissue with seed and clip verified by Faxitron  Drains: None  IV fluids: Per anesthesia record  Indications for procedure: The patient 71 year old female with a core biopsy proven radial scar distortion left breast.  Excision recommended to exclude malignancy.  Risks and benefits of observation versus removal of the lesion were discussed as well as complications of surgery and long-term expectations.  Cosmetic issues discussed as well as wound issues.  After the above discussion she wished to proceed.The procedure has been discussed with the patient. Alternatives to surgery have been discussed with the patient.  Risks of surgery include bleeding,  Infection,  Seroma formation, death,  and the need for further surgery.   The patient understands and wishes to proceed.    Description of procedure: The patient was met in the holding area and questions were answered.  She was then brought back to the operating room.  She was placed upon upon the OR table.  Of note a seed was placed as an outpatient.  After induction of general esthesia, left breast was prepped and draped in sterile fashion timeout performed.  Proper patient, site and procedure verified.  Films were available for review.  After time was performed local anesthetic was infiltrated over the signal in the upper central left breast.  Incision was then extended down into the subcutaneous tissues.  Bovie used to excise all tissue and we went superior to try to incorporate an area of distortion noted on her mammogram.  This was dense fibrous tissue when I came above it.  All tissue and the seed and clip were excised with a grossly negative margin.  Hemostasis achieved with cautery.  The Faxitron image revealed the  seed and clip to be present in the specimen.  The cavity is irrigated.  Is made hemostatic with cautery.  Local anesthetic was infiltrated throughout.  The cavity is closed with a deep layer 3-0 Vicryl.  4 Monocryl was used to close skin in a subcuticular fashion.  Dermabond applied.  Breast binder placed.  All counts found to be correct.  The patient was awoke extubated taken to recovery in satisfactory condition.

## 2022-06-22 NOTE — Interval H&P Note (Signed)
History and Physical Interval Note:  06/22/2022 2:39 PM  Margaret Vargas  has presented today for surgery, with the diagnosis of LEFT BREAST RADIAL SCAR.  The various methods of treatment have been discussed with the patient and family. After consideration of risks, benefits and other options for treatment, the patient has consented to  Procedure(s): LEFT BREAST LUMPECTOMY WITH RADIOACTIVE SEED LOCALIZATION (Left) as a surgical intervention.  The patient's history has been reviewed, patient examined, no change in status, stable for surgery.  I have reviewed the patient's chart and labs.  Questions were answered to the patient's satisfaction.     Gurvir Schrom A Elianis Fischbach

## 2022-06-22 NOTE — H&P (Signed)
History of Present Illness: Margaret Vargas is a 71 y.o. female who is seen today as an office consultation for evaluation of New Consultation (Breast ) .  Patient presents for evaluation of abnormal left breast mammogram. She had a area of distortion noted left breast upper outer quadrant core biopsy proven to be radial scar. No family history of breast cancer. This is her first biopsy. No history of breast pain breast mass or nipple discharge noted by the patient.  Review of Systems: A complete review of systems was obtained from the patient. I have reviewed this information and discussed as appropriate with the patient. See HPI as well for other ROS.  ROS  Medical History: Past Medical History: Diagnosis Date Anxiety CHF (congestive heart failure) (CMS-HCC) Diabetes mellitus without complication (CMS-HCC) Hypertension Sleep apnea  There is no problem list on file for this patient.  Past Surgical History: Procedure Laterality Date CONVERSION OF SINGLE CHAMBER PACEMAKER TO DUAL CHAMBER PACEMAKER 2018 HYSTERECTOMY SPINE SURGERY   Allergies Allergen Reactions Adhesive Tape-Silicones Itching and Rash Morphine Itching  Current Outpatient Medications on File Prior to Visit Medication Sig Dispense Refill allopurinoL (ZYLOPRIM) 300 MG tablet aspirin 81 MG EC tablet Take 1 tablet every day by oral route. buPROPion (WELLBUTRIN XL) 150 MG XL tablet Take 1 tablet by mouth every morning ENTRESTO 24-26 mg tablet FUROsemide (LASIX) 40 MG tablet metoprolol succinate (TOPROL-XL) 25 MG XL tablet MOUNJARO 7.5 mg/0.5 mL PnIj simvastatin (ZOCOR) 40 MG tablet Take 40 mg by mouth at bedtime spironolactone (ALDACTONE) 25 MG tablet  No current facility-administered medications on file prior to visit.  Family History Problem Relation Age of Onset Obesity Mother High blood pressure (Hypertension) Mother Hyperlipidemia (Elevated cholesterol) Mother Deep vein thrombosis (DVT or abnormal blood  clot formation) Mother Coronary Artery Disease (Blocked arteries around heart) Sister Hyperlipidemia (Elevated cholesterol) Sister High blood pressure (Hypertension) Sister   Social History  Tobacco Use Smoking Status Former Types: Cigarettes Quit date: 1989 Years since quitting: 34.7 Smokeless Tobacco Never   Social History  Socioeconomic History Marital status: Married Tobacco Use Smoking status: Former Types: Cigarettes Quit date: 1989 Years since quitting: 34.7 Smokeless tobacco: Never Substance and Sexual Activity Alcohol use: Not Currently Drug use: Never  Objective:  Vitals: 05/02/22 1144 BP: 122/80 Pulse: 86 Temp: 36.5 C (97.7 F) SpO2: 96% Weight: (!) 131.1 kg (289 lb) Height: 175.3 cm (5\' 9" )  Body mass index is 42.68 kg/m.  Physical Exam HENT: Head: Normocephalic. Cardiovascular: Rate and Rhythm: Normal rate. Pulmonary: Effort: Pulmonary effort is normal. Chest: Breasts: Right: Normal. No inverted nipple or tenderness. Left: Normal. No inverted nipple or tenderness. Musculoskeletal: Cervical back: Normal range of motion. Lymphadenopathy: Upper Body: Right upper body: No supraclavicular or axillary adenopathy. Left upper body: No supraclavicular or axillary adenopathy. Neurological: Mental Status: She is alert.   Labs, Imaging and Diagnostic Testing: CLINICAL DATA: Screening recall for a left breast asymmetry.  EXAM: DIGITAL DIAGNOSTIC UNILATERAL LEFT MAMMOGRAM WITH TOMOSYNTHESIS; ULTRASOUND LEFT BREAST LIMITED  TECHNIQUE: Left digital diagnostic mammography and breast tomosynthesis was performed.; Targeted ultrasound examination of the left breast was performed.  COMPARISON: Previous exam(s).  ACR Breast Density Category b: There are scattered areas of fibroglandular density.  FINDINGS: Spot compression tomosynthesis images through the upper inner left breast demonstrates a persistent subtle distortion in the  posterior depth. This persists on the full paddle true lateral as well as the rolled medial and rolled lateral views.  Ultrasound targeted to the superior to upper inner left breast demonstrates  normal fibroglandular tissue. No suspicious masses or areas of shadowing are identified. Ultrasound of the left axilla demonstrates multiple normal-appearing lymph nodes.  IMPRESSION: 1. There is a distortion without a sonographic correlate in the superior to upper inner left breast.  2. No evidence of left axillary lymphadenopathy.  RECOMMENDATION: Stereotactic biopsy is recommended for the left breast distortion. This has been scheduled for 04/15/2022 at 8:30 a.m.  I have discussed the findings and recommendations with the patient. If applicable, a reminder letter will be sent to the patient regarding the next appointment.  BI-RADS CATEGORY 4: Suspicious.   Electronically Signed By: Frederico Hamman M.D. On: 04/11/2022 11:24 CLINICAL DATA: Screening recall for a left breast asymmetry.  EXAM: DIGITAL DIAGNOSTIC UNILATERAL LEFT MAMMOGRAM WITH TOMOSYNTHESIS; ULTRASOUND LEFT BREAST LIMITED  TECHNIQUE: Left digital diagnostic mammography and breast tomosynthesis was performed.; Targeted ultrasound examination of the left breast was performed.  COMPARISON: Previous exam(s).  ACR Breast Density Category b: There are scattered areas of fibroglandular density.  FINDINGS: Spot compression tomosynthesis images through the upper inner left breast demonstrates a persistent subtle distortion in the posterior depth. This persists on the full paddle true lateral as well as the rolled medial and rolled lateral views.  Ultrasound targeted to the superior to upper inner left breast demonstrates normal fibroglandular tissue. No suspicious masses or areas of shadowing are identified. Ultrasound of the left axilla demonstrates multiple normal-appearing lymph nodes.  IMPRESSION: 1. There  is a distortion without a sonographic correlate in the superior to upper inner left breast.  2. No evidence of left axillary lymphadenopathy.  RECOMMENDATION: Stereotactic biopsy is recommended for the left breast distortion. This has been scheduled for 04/15/2022 at 8:30 a.m.  I have discussed the findings and recommendations with the patient. If applicable, a reminder letter will be sent to the patient regarding the next appointment.  BI-RADS CATEGORY 4: Suspicious.   Electronically Signed By: Frederico Hamman M.D. On: 04/11/2022 11:24 Diagnosis Breast, left, needle core biopsy, Left upper inner - COMPLEX SCLEROSING LESION WITH USUAL DUCTAL HYPERPLASIA. - FIBROCYSTIC CHANGES WITH USUAL DUCTAL HYPERPLASIA. - COLUMNAR CELL HYPERPLASIA. - SEE NOTE.  Assessment and Plan:  Diagnoses and all orders for this visit:  Radial scar of breast    Discussed left breast lumpectomy seed localized for radial scar. Discussed potential upgrade risks and the pathophysiology, motor detection and what long-term follow-up looks like. Discussed left breast seed localized lumpectomy. Risks and benefits reviewed. She agrees to proceed due to potential upgrade risk.The procedure has been discussed with the patient. Alternatives to surgery have been discussed with the patient. Risks of surgery include bleeding, Infection, Seroma formation, death, and the need for further surgery. The patient understands and wishes to proceed.  No follow-ups on file.  Hayden Rasmussen, MD

## 2022-06-23 ENCOUNTER — Encounter (HOSPITAL_COMMUNITY): Payer: Self-pay | Admitting: Surgery

## 2022-06-25 LAB — SURGICAL PATHOLOGY

## 2022-06-26 ENCOUNTER — Encounter: Payer: Self-pay | Admitting: Surgery

## 2022-07-14 ENCOUNTER — Encounter (HOSPITAL_COMMUNITY): Payer: Self-pay

## 2022-07-14 ENCOUNTER — Ambulatory Visit (INDEPENDENT_AMBULATORY_CARE_PROVIDER_SITE_OTHER): Payer: Medicare HMO

## 2022-07-14 DIAGNOSIS — I428 Other cardiomyopathies: Secondary | ICD-10-CM | POA: Diagnosis not present

## 2022-07-18 LAB — CUP PACEART REMOTE DEVICE CHECK
Battery Remaining Longevity: 90 mo
Battery Remaining Percentage: 100 %
Brady Statistic RA Percent Paced: 0 %
Brady Statistic RV Percent Paced: 0 %
Date Time Interrogation Session: 20231201234400
HighPow Impedance: 87 Ohm
Implantable Lead Connection Status: 753985
Implantable Lead Connection Status: 753985
Implantable Lead Connection Status: 753985
Implantable Lead Implant Date: 20180823
Implantable Lead Implant Date: 20180823
Implantable Lead Implant Date: 20180823
Implantable Lead Location: 753858
Implantable Lead Location: 753859
Implantable Lead Location: 753860
Implantable Lead Model: 293
Implantable Lead Model: 4674
Implantable Lead Model: 7741
Implantable Lead Serial Number: 435016
Implantable Lead Serial Number: 609786
Implantable Lead Serial Number: 872654
Implantable Pulse Generator Implant Date: 20180823
Lead Channel Impedance Value: 399 Ohm
Lead Channel Impedance Value: 666 Ohm
Lead Channel Impedance Value: 685 Ohm
Lead Channel Setting Pacing Amplitude: 2 V
Lead Channel Setting Pacing Amplitude: 2.3 V
Lead Channel Setting Pacing Amplitude: 2.5 V
Lead Channel Setting Pacing Pulse Width: 0.4 ms
Lead Channel Setting Pacing Pulse Width: 0.8 ms
Lead Channel Setting Sensing Sensitivity: 0.5 mV
Lead Channel Setting Sensing Sensitivity: 1 mV
Pulse Gen Serial Number: 186593
Zone Setting Status: 755011

## 2022-07-28 ENCOUNTER — Encounter (HOSPITAL_COMMUNITY): Payer: Self-pay | Admitting: Cardiology

## 2022-07-28 ENCOUNTER — Ambulatory Visit (HOSPITAL_COMMUNITY)
Admission: RE | Admit: 2022-07-28 | Discharge: 2022-07-28 | Disposition: A | Discharge status: Home / Self Care | Payer: Medicare HMO | Source: Ambulatory Visit | Attending: Cardiology | Admitting: Cardiology

## 2022-07-28 VITALS — BP 110/70 | HR 77 | Wt 282.8 lb

## 2022-07-28 DIAGNOSIS — I5022 Chronic systolic (congestive) heart failure: Secondary | ICD-10-CM | POA: Diagnosis present

## 2022-07-28 LAB — BASIC METABOLIC PANEL
Anion gap: 8 (ref 5–15)
BUN: 13 mg/dL (ref 8–23)
CO2: 26 mmol/L (ref 22–32)
Calcium: 9.4 mg/dL (ref 8.9–10.3)
Chloride: 105 mmol/L (ref 98–111)
Creatinine, Ser: 0.89 mg/dL (ref 0.44–1.00)
GFR, Estimated: >60 mL/min (ref >60)
Glucose, Bld: 105 mg/dL — ABNORMAL HIGH (ref 70–99)
Potassium: 3.6 mmol/L (ref 3.5–5.1)
Sodium: 139 mmol/L (ref 135–145)

## 2022-07-28 MED ORDER — SIMVASTATIN 40 MG PO TABS: 40.0000 mg | ORAL_TABLET | Freq: Every day | ORAL | 3 refills | Status: DC

## 2022-07-28 MED ORDER — FUROSEMIDE 40 MG PO TABS: 40.0000 mg | ORAL_TABLET | Freq: Every day | ORAL | 3 refills | Status: DC

## 2022-07-28 NOTE — Patient Instructions (Addendum)
DECREASE Lasix to 40 mg daily.  Labs done today, your results will be available in MyChart, we will contact you for abnormal readings.   Repeat blood work in 2 months at [facility]  Your physician recommends that you schedule a follow-up appointment in: 1 year ( December 2024)  ** please call the office in September to arrange your follow up appointment **  If you have any questions or concerns before your next appointment please send [place] a message through [place] or call our office at [phone].    TO LEAVE A MESSAGE FOR THE NURSE SELECT OPTION 2, PLEASE LEAVE A MESSAGE INCLUDING: YOUR NAME DATE OF BIRTH CALL BACK NUMBER REASON FOR CALL**this is important as we prioritize the call backs  YOU WILL RECEIVE A CALL BACK THE SAME DAY AS LONG AS YOU CALL BEFORE 4:00 PM  At the [facility], you and your health needs are our priority. As part of our continuing mission to provide you with exceptional heart care, we have created designated Provider Care Teams. These Care Teams include your primary Cardiologist (physician) and Advanced Practice Providers (APPs- Physician Assistants and Nurse Practitioners) who all work together to provide you with the care you need, when you need it.   You may see any of the following providers on your designated Care Team at your next follow up: Dr Arvilla Meres Dr Marca Ancona Dr. Marcos Eke, NP Robbie Lis, [place] [facility] [place], [place] Brynda Peon, NP Karle Plumber, PharmD   Please be sure to bring in all your medications bottles to every appointment.

## 2022-07-31 NOTE — Progress Notes (Signed)
[facility] Note  PCP: Dr. Paulino Rily HF Cardiology: Dr. Janell Quiet is a 71 y.o. female with a history of suspected rheumatic fever, nonischemic cardiomyopathy, and severe mitral regurgitation presents for followup of CHF and mitral regurgitation.  Patient had an episode in childhood that was thought to be rheumatic fever.  She had a murmur as a child, but never had an echocardiogram.  Starting last summer, she developed exertional dyspnea. This has been progressive since that time.  She never saw a cardiologist until she was admitted in 4/18 with worsening dyspnea and weight gain.  Weight increased about 35 lbs from last summer.   She was diuresed in the hospital and had an echo and a TEE, showing EF 25-30% with rheumatic-appearing mitral valve and severe mitral regurgitation.  She has been seen by Dr Barry Dienes for evaluation for surgical repair/replacement of the MV.  Given improvement in symptoms, he suggested repeating TEE eventually to reassess.  Therefore, she had repeat RHC and TEE in 8/18 after medical therapy.  EF was 30-35% with septal-lateral dyssynchrony and there was only moderate functional MR.  RHC showed low filling pressures.  She had [employer] CRT-D placed in 8/18.   Echo 09/2017 showed EF up to 55-60% with mild MR. Echo in 8/22 showed EF 55-60% with mild MR.   Right breast lumpectomy in 11/23 showing fibrocystic changes.   She presents today for followup of valvular heart disease and CHF.  She reports occasional fatigue but generally doing well.  Weight down 17 lbs since last appt, working on diet.  No significant exertional dyspnea or chest pain.  No palpitations.  No orthopnea/PND.   Labs (4/18): BNP 698, K 4.3, creatinine 1.13 => 0.94, BNP 599 Labs (5/18): BNP 206, K 4.2, creatinine 0.87 Labs (7/18): digoxin 0.4, K 3.7, creatinine 0.98 Labs (8/18): K 4.2, creatinine 0.75, hgb 14.1 Labs (1/19): K 4.3, creatinine 0.96 Labs (2/19): K 3.7,  creatinine 0.94 Labs (5/19): K 3.8, creatinine 0.91 Labs (4/22): K 4.6, creatinine 0.95, hgb 13.1 Labs (11/23): K 4.1, creatinine 0.83  PMH: 1. OSA 2. LBBB 3. h/o C difficile 4. Rheumatic fever as a child with possible rheumatic heart disease and severe MR.  - Echo (4/18): EF 25-30%, mild RV dilation with mildly decreased RV systolic function, moderate TR, thickened MV leaflets with 2 MR jets (2 defined areas of malcoaptation), severe MR.   - TEE (4/18): EF 25-30%, mild LV dilation, confirms severe MR with the above features.  - TEE (8/18): EF 30-35%, septal-lateral dyssynchrony, moderate MR (likely functional).  - Echo (1/19): EF 55-60%, mild MR.  - Echo (8/23): EF 55-60%, mild MR.  5. Chronic systolic CHF: Nonischemic, prior myocarditis + valvular, versus all valvular.  - RHC/LHC (4/18): No significant CAD, severe MR.  Mean RA 18, PA 61/29, mean PCWP 30, CI 1.89.  - EF 30-35% on TEE 8/18.  - RHC (8/18): mean RA 1, PA 32/8, mean PCWP 6, CI 1.83 - [employer] CRT-D placed 8/18.  - Echo (2/19): EF 55-60%, mild MR 6. Anxiety/depression 7. Lung nodule by CT  8. Type 2 diabetes 9. GERD 10. Gout  SH: Married, lives in [place], retired Airline pilot, daughter lives in [place], nonsmoker, no ETOH.   FH: Mother with sudden cardiac death at 98.   Review of systems complete and found to be negative unless listed in HPI.   Current Outpatient Medications  Medication Sig Dispense Refill   acetaminophen (TYLENOL) 500 MG tablet Take 1,000  mg by mouth every 8 (eight) hours as needed for moderate pain.     albuterol (PROVENTIL HFA;VENTOLIN HFA) 108 (90 Base) MCG/ACT inhaler Inhale 2 puffs into the lungs every 6 (six) hours as needed for wheezing or shortness of breath.     allopurinol (ZYLOPRIM) 300 MG tablet Take 300 mg by mouth daily.     ALPRAZolam (XANAX) 0.25 MG tablet Take 0.125 mg by mouth 2 (two) times daily as needed for anxiety.     aspirin EC 81 MG tablet Take 81 mg by mouth  every morning.      buPROPion (WELLBUTRIN XL) 150 MG 24 hr tablet Take 150 mg by mouth daily.     cholecalciferol (VITAMIN D) 1000 UNITS tablet Take 1,000 Units by mouth daily.     colchicine 0.6 MG tablet Take 0.6 mg by mouth daily as needed (Gout Flare).     Cyanocobalamin (VITAMIN B-12) 2500 MCG SUBL Place 2,500 mcg under the tongue daily.     cyclobenzaprine (FLEXERIL) 10 MG tablet Take 10 mg by mouth 3 (three) times daily as needed for muscle spasms.     escitalopram (LEXAPRO) 20 MG tablet Take 20 mg by mouth every morning.      fluticasone (FLONASE) 50 MCG/ACT nasal spray Place 2 sprays into both nostrils daily as needed for allergies or rhinitis.     fluticasone furoate-vilanterol (BREO ELLIPTA) 200-25 MCG/INH AEPB Inhale 1 puff into the lungs daily as needed (asthma).     levalbuterol (XOPENEX) 0.63 MG/3ML nebulizer solution Take 0.63 mg by nebulization every 4 (four) hours as needed for wheezing or shortness of breath.     levocetirizine (XYZAL) 5 MG tablet Take 5 mg by mouth every evening.     meloxicam (MOBIC) 15 MG tablet Take 15 mg by mouth daily as needed for pain.     metoprolol succinate (TOPROL-XL) 25 MG 24 hr tablet Please call to schedule follow up [phone] TAKE 1 TABLET IN THE MORNING AND 2 TABLETS IN THE EVENING 180 tablet 5   MOUNJARO 7.5 MG/0.5ML Pen Inject 7.5 mg into the skin once a week. Takes on Sunday     omeprazole (PRILOSEC) 40 MG capsule Take 40 mg by mouth in the morning and at bedtime.     sacubitril-valsartan (ENTRESTO) 24-26 MG Take 1 tablet by mouth 2 (two) times daily. 14 tablet 0   spironolactone (ALDACTONE) 25 MG tablet Take 1 tablet (25 mg total) by mouth at bedtime. 90 tablet 0   furosemide (LASIX) 40 MG tablet Take 1 tablet (40 mg total) by mouth daily. 180 tablet 3   simvastatin (ZOCOR) 40 MG tablet Take 1 tablet (40 mg total) by mouth at bedtime. 90 tablet 3   No current facility-administered medications for this encounter.   BP 110/70   Pulse 77    Wt 128.3 kg (282 lb 12.8 oz)   LMP  (LMP Unknown)   SpO2 98%   BMI 40.58 kg/m    Wt Readings from Last 3 Encounters:  07/28/22 128.3 kg (282 lb 12.8 oz)  06/22/22 129.3 kg (285 lb)  06/13/22 130.2 kg (287 lb)   General: NAD Neck: No JVD, no thyromegaly or thyroid nodule.  Lungs: Clear to auscultation bilaterally with normal respiratory effort. CV: Nondisplaced PMI.  Heart regular S1/S2, no S3/S4, no murmur.  No peripheral edema.  No carotid bruit.  Normal pedal pulses.  Abdomen: Soft, nontender, no hepatosplenomegaly, no distention.  Skin: Intact without lesions or rashes.  Neurologic: Alert and oriented  x 3.  Psych: Normal affect. Extremities: No clubbing or cyanosis.  HEENT: Normal.   Assessment/Plan: 1. Chronic systolic CHF: EF 09-32% on echo in 4/18, nonischemic cardiomyopathy.  She had severe mitral regurgitation and a rheumatic-appearing mitral valve.  It is hard to know which was the initial problem (cardiomyopathy or mitral regurgitation), and hard to know how related they are.  However, based on most recent echo, suspect that she has a nonischemic cardiomyopathy from some other etiology like myocarditis and has a co-existing rheumatic mitral valve with perhaps worsening of MR from annular dilatation (secondary MR). Followup TEE in 8/18 showed EF 30-35% with dyssynchrony but MR only moderate.  RHC showed low filling pressures.  Now s/p [employer] CRT-D.  Echo 2/19 and again in 8/23 with EF up to 55-60% and MR only mild. NYHA class II, not volume overloaded.  - I think she can back off on Lasix.  Decrease to 40 mg daily today and can decrease further if no evidence for volume accumulation. BMET today.     - Continue spiro 25 mg daily - Continue Entresto 24/26 mg BID - Continue Toprol 25 mg am, 50 mg pm 2. Rheumatic heart disease: Severe MR on 4/18 TEE with a rheumatic-appearing valve, also moderate TR.  There were 2 jets of MR from 2 defined areas of malcoaptation.  As  above, I suspect there was a co-existing nonischemic cardiomyopathy with a component of functional MR from annular dilatation.  Repeat TEE in 8/18 showed moderate MR, and echo 09/2017 and again in 8/23 with normalization of EF showed only mild MR.  It appears that most of the MR was functional.  3. LBBB: s/p CRT device placement. No change.  4. Obesity: Weight trending down.  5. Hyperlipidemia: She is out of Zocor. Will refill and check lipids in 2 months.   Followup in 1 year.   Margaret Vargas 07/31/2022

## 2022-08-03 NOTE — Progress Notes (Signed)
Remote ICD transmission.   

## 2022-08-12 ENCOUNTER — Other Ambulatory Visit (HOSPITAL_COMMUNITY): Payer: Self-pay | Admitting: Cardiology

## 2022-08-16 ENCOUNTER — Ambulatory Visit
Admission: RE | Admit: 2022-08-16 | Discharge: 2022-08-16 | Disposition: A | Discharge status: Home / Self Care | Payer: Medicare HMO | Source: Ambulatory Visit | Attending: Family Medicine | Admitting: Family Medicine

## 2022-08-16 DIAGNOSIS — E2839 Other primary ovarian failure: Secondary | ICD-10-CM

## 2022-10-25 ENCOUNTER — Ambulatory Visit (INDEPENDENT_AMBULATORY_CARE_PROVIDER_SITE_OTHER): Payer: Medicare HMO

## 2022-10-25 DIAGNOSIS — I428 Other cardiomyopathies: Secondary | ICD-10-CM

## 2022-10-27 LAB — CUP PACEART REMOTE DEVICE CHECK
Battery Remaining Longevity: 90 mo
Battery Remaining Percentage: 100 %
Brady Statistic RA Percent Paced: 0 %
Brady Statistic RV Percent Paced: 0 %
Date Time Interrogation Session: 20240307103800
HighPow Impedance: 84 Ohm
Implantable Lead Connection Status: 753985
Implantable Lead Connection Status: 753985
Implantable Lead Connection Status: 753985
Implantable Lead Implant Date: 20180823
Implantable Lead Implant Date: 20180823
Implantable Lead Implant Date: 20180823
Implantable Lead Location: 753858
Implantable Lead Location: 753859
Implantable Lead Location: 753860
Implantable Lead Model: 293
Implantable Lead Model: 4674
Implantable Lead Model: 7741
Implantable Lead Serial Number: 435016
Implantable Lead Serial Number: 609786
Implantable Lead Serial Number: 872654
Implantable Pulse Generator Implant Date: 20180823
Lead Channel Impedance Value: 409 Ohm
Lead Channel Impedance Value: 649 Ohm
Lead Channel Impedance Value: 702 Ohm
Lead Channel Setting Pacing Amplitude: 2 V
Lead Channel Setting Pacing Amplitude: 2.3 V
Lead Channel Setting Pacing Amplitude: 2.5 V
Lead Channel Setting Pacing Pulse Width: 0.4 ms
Lead Channel Setting Pacing Pulse Width: 0.8 ms
Lead Channel Setting Sensing Sensitivity: 0.5 mV
Lead Channel Setting Sensing Sensitivity: 1 mV
Pulse Gen Serial Number: 186593
Zone Setting Status: 755011

## 2022-12-06 NOTE — Progress Notes (Signed)
Remote ICD transmission.   

## 2023-01-24 ENCOUNTER — Ambulatory Visit (INDEPENDENT_AMBULATORY_CARE_PROVIDER_SITE_OTHER): Payer: Medicare HMO

## 2023-01-24 DIAGNOSIS — I428 Other cardiomyopathies: Secondary | ICD-10-CM | POA: Diagnosis not present

## 2023-01-31 LAB — CUP PACEART REMOTE DEVICE CHECK
Date Time Interrogation Session: 20240617164704
Implantable Lead Connection Status: 753985
Implantable Lead Connection Status: 753985
Implantable Lead Connection Status: 753985
Implantable Lead Implant Date: 20180823
Implantable Lead Implant Date: 20180823
Implantable Lead Implant Date: 20180823
Implantable Lead Location: 753858
Implantable Lead Location: 753859
Implantable Lead Location: 753860
Implantable Lead Model: 293
Implantable Lead Model: 4674
Implantable Lead Model: 7741
Implantable Lead Serial Number: 435016
Implantable Lead Serial Number: 609786
Implantable Lead Serial Number: 872654
Implantable Pulse Generator Implant Date: 20180823
Pulse Gen Serial Number: 186593

## 2023-02-21 NOTE — Progress Notes (Signed)
Remote ICD transmission.   

## 2023-03-28 ENCOUNTER — Other Ambulatory Visit (HOSPITAL_COMMUNITY): Payer: Self-pay

## 2023-03-28 MED ORDER — METOPROLOL SUCCINATE ER 25 MG PO TB24: ORAL_TABLET | ORAL | 1 refills | Status: DC

## 2023-04-25 ENCOUNTER — Ambulatory Visit (INDEPENDENT_AMBULATORY_CARE_PROVIDER_SITE_OTHER): Payer: Medicare HMO

## 2023-04-25 DIAGNOSIS — I428 Other cardiomyopathies: Secondary | ICD-10-CM | POA: Diagnosis not present

## 2023-04-25 DIAGNOSIS — I5022 Chronic systolic (congestive) heart failure: Secondary | ICD-10-CM

## 2023-04-27 LAB — CUP PACEART REMOTE DEVICE CHECK
Battery Remaining Longevity: 84 mo
Battery Remaining Percentage: 96 %
Brady Statistic RA Percent Paced: 0 %
Brady Statistic RV Percent Paced: 0 %
Date Time Interrogation Session: 20240912133000
HighPow Impedance: 78 Ohm
Implantable Lead Connection Status: 753985
Implantable Lead Connection Status: 753985
Implantable Lead Connection Status: 753985
Implantable Lead Implant Date: 20180823
Implantable Lead Implant Date: 20180823
Implantable Lead Implant Date: 20180823
Implantable Lead Location: 753858
Implantable Lead Location: 753859
Implantable Lead Location: 753860
Implantable Lead Model: 293
Implantable Lead Model: 4674
Implantable Lead Model: 7741
Implantable Lead Serial Number: 435016
Implantable Lead Serial Number: 609786
Implantable Lead Serial Number: 872654
Implantable Pulse Generator Implant Date: 20180823
Lead Channel Impedance Value: 398 Ohm
Lead Channel Impedance Value: 603 Ohm
Lead Channel Impedance Value: 695 Ohm
Lead Channel Setting Pacing Amplitude: 2 V
Lead Channel Setting Pacing Amplitude: 2.3 V
Lead Channel Setting Pacing Amplitude: 2.5 V
Lead Channel Setting Pacing Pulse Width: 0.4 ms
Lead Channel Setting Pacing Pulse Width: 0.8 ms
Lead Channel Setting Sensing Sensitivity: 0.5 mV
Lead Channel Setting Sensing Sensitivity: 1 mV
Pulse Gen Serial Number: 186593
Zone Setting Status: 755011

## 2023-05-12 NOTE — Progress Notes (Signed)
Remote ICD transmission.   

## 2023-06-05 LAB — LAB REPORT - SCANNED
A1c: 5.4
Creatinine, POC: 343 mg/dL
EGFR: 68
Microalb Creat Ratio: 24.9
Microalbumin, Urine: 8.53

## 2023-06-08 ENCOUNTER — Encounter: Payer: Self-pay | Admitting: Family Medicine

## 2023-07-10 ENCOUNTER — Other Ambulatory Visit: Payer: Self-pay | Admitting: Family Medicine

## 2023-07-10 DIAGNOSIS — Z0000 Encounter for general adult medical examination without abnormal findings: Secondary | ICD-10-CM

## 2023-07-21 ENCOUNTER — Ambulatory Visit
Admission: RE | Admit: 2023-07-21 | Discharge: 2023-07-21 | Disposition: A | Discharge status: Home / Self Care | Payer: Medicare HMO | Source: Ambulatory Visit | Attending: Family Medicine | Admitting: Family Medicine

## 2023-07-21 DIAGNOSIS — Z0000 Encounter for general adult medical examination without abnormal findings: Secondary | ICD-10-CM

## 2023-07-24 ENCOUNTER — Other Ambulatory Visit: Payer: Self-pay | Admitting: Family Medicine

## 2023-07-24 DIAGNOSIS — N63 Unspecified lump in unspecified breast: Secondary | ICD-10-CM

## 2023-08-07 ENCOUNTER — Other Ambulatory Visit (HOSPITAL_COMMUNITY): Payer: Self-pay | Admitting: Cardiology

## 2023-08-07 MED ORDER — ENTRESTO 24-26 MG PO TABS: 1.0000 | ORAL_TABLET | Freq: Two times a day (BID) | ORAL | 0 refills | Status: DC

## 2023-08-21 ENCOUNTER — Ambulatory Visit
Admission: RE | Admit: 2023-08-21 | Discharge: 2023-08-21 | Disposition: A | Discharge status: Home / Self Care | Payer: Medicare HMO | Source: Ambulatory Visit | Attending: Family Medicine | Admitting: Family Medicine

## 2023-08-21 DIAGNOSIS — N63 Unspecified lump in unspecified breast: Secondary | ICD-10-CM

## 2023-09-03 ENCOUNTER — Other Ambulatory Visit (HOSPITAL_COMMUNITY): Payer: Self-pay | Admitting: Cardiology

## 2023-10-03 ENCOUNTER — Other Ambulatory Visit (HOSPITAL_COMMUNITY): Payer: Self-pay | Admitting: Cardiology

## 2023-10-10 ENCOUNTER — Other Ambulatory Visit (HOSPITAL_COMMUNITY): Payer: Self-pay | Admitting: Cardiology

## 2023-10-10 DIAGNOSIS — I5022 Chronic systolic (congestive) heart failure: Secondary | ICD-10-CM

## 2023-10-10 NOTE — Telephone Encounter (Signed)
 Pt left VM on triage line requesting pharmacy change however pharmacy stated invalid [facility] -> [facility]

## 2023-10-11 MED ORDER — METOPROLOL SUCCINATE ER 25 MG PO TB24: ORAL_TABLET | ORAL | 1 refills | Status: DC

## 2023-10-11 MED ORDER — SIMVASTATIN 40 MG PO TABS: 40.0000 mg | ORAL_TABLET | Freq: Every day | ORAL | 3 refills | Status: DC

## 2023-10-11 MED ORDER — SPIRONOLACTONE 25 MG PO TABS: 25.0000 mg | ORAL_TABLET | Freq: Every day | ORAL | 0 refills | Status: DC

## 2023-10-11 MED ORDER — FUROSEMIDE 40 MG PO TABS: 40.0000 mg | ORAL_TABLET | Freq: Every day | ORAL | 3 refills | Status: DC

## 2023-10-11 MED ORDER — SACUBITRIL-VALSARTAN 24-26 MG PO TABS: 1.0000 | ORAL_TABLET | Freq: Two times a day (BID) | ORAL | 0 refills | Status: DC

## 2023-10-24 ENCOUNTER — Ambulatory Visit: Payer: Medicare HMO

## 2023-10-24 DIAGNOSIS — I428 Other cardiomyopathies: Secondary | ICD-10-CM

## 2023-10-25 LAB — CUP PACEART REMOTE DEVICE CHECK
Battery Remaining Longevity: 78 mo
Battery Remaining Percentage: 92 %
Brady Statistic RA Percent Paced: 0 %
Brady Statistic RV Percent Paced: 1 %
Date Time Interrogation Session: 20250311005200
HighPow Impedance: 83 Ohm
Implantable Lead Connection Status: 753985
Implantable Lead Connection Status: 753985
Implantable Lead Connection Status: 753985
Implantable Lead Implant Date: 20180823
Implantable Lead Implant Date: 20180823
Implantable Lead Implant Date: 20180823
Implantable Lead Location: 753858
Implantable Lead Location: 753859
Implantable Lead Location: 753860
Implantable Lead Model: 293
Implantable Lead Model: 4674
Implantable Lead Model: 7741
Implantable Lead Serial Number: 435016
Implantable Lead Serial Number: 609786
Implantable Lead Serial Number: 872654
Implantable Pulse Generator Implant Date: 20180823
Lead Channel Impedance Value: 400 Ohm
Lead Channel Impedance Value: 604 Ohm
Lead Channel Impedance Value: 688 Ohm
Lead Channel Setting Pacing Amplitude: 2 V
Lead Channel Setting Pacing Amplitude: 2.3 V
Lead Channel Setting Pacing Amplitude: 2.5 V
Lead Channel Setting Pacing Pulse Width: 0.4 ms
Lead Channel Setting Pacing Pulse Width: 0.8 ms
Lead Channel Setting Sensing Sensitivity: 0.5 mV
Lead Channel Setting Sensing Sensitivity: 1 mV
Pulse Gen Serial Number: 186593
Zone Setting Status: 755011

## 2023-10-26 ENCOUNTER — Encounter: Payer: Self-pay | Admitting: Internal Medicine

## 2023-11-07 ENCOUNTER — Other Ambulatory Visit (HOSPITAL_COMMUNITY): Payer: Self-pay | Admitting: Cardiology

## 2023-11-07 ENCOUNTER — Encounter (HOSPITAL_COMMUNITY): Payer: Medicare HMO

## 2023-11-22 NOTE — Progress Notes (Signed)
 [facility] Note  PCP: Dr. Paulino Rily HF Cardiology: Dr. Janell Quiet is a 73 y.o. female with a history of suspected rheumatic fever, nonischemic cardiomyopathy, and severe mitral regurgitation presents for followup of CHF and mitral regurgitation.  Patient had an episode in childhood that was thought to be rheumatic fever.  She had a murmur as a child, but never had an echocardiogram.  Starting last summer, she developed exertional dyspnea. This has been progressive since that time.  She never saw a cardiologist until she was admitted in 4/18 with worsening dyspnea and weight gain.  Weight increased about 35 lbs from last summer.   She was diuresed in the hospital and had an echo and a TEE, showing EF 25-30% with rheumatic-appearing mitral valve and severe mitral regurgitation.  She has been seen by Dr Barry Dienes for evaluation for surgical repair/replacement of the MV.  Given improvement in symptoms, he suggested repeating TEE eventually to reassess.  Therefore, she had repeat RHC and TEE in 8/18 after medical therapy.  EF was 30-35% with septal-lateral dyssynchrony and there was only moderate functional MR.  RHC showed low filling pressures.  She had [employer] CRT-D placed in 8/18.   Echo 09/2017 showed EF up to 55-60% with mild MR. Echo in 8/22 showed EF 55-60% with mild MR.   Right breast lumpectomy in 11/23 showing fibrocystic changes.   Today she returns for yearly HF follow up. Overall feeling fine, struggling with the pollen, but otherwise no issues. Denies increasing SOB, palpitations, abnormal bleeding, CP, dizziness, edema, or PND/Orthopnea. Appetite ok. No fever or chills. Weight at home 242 pounds. Taking all medications. She has lost 65 lbs. 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  ECG (personally reviewed): A sensed V paced 76 bpm  BoSCI interrogation (personally reviewed): HL Score 10, no AF, 0.3 hr/day activity, average HR 68 bpm,  thoracic impedence stabl  Labs (4/22): K 4.6, creatinine 0.95, hgb 13.1 Labs (12/23): K 3.6, creatinine 0.89  PMH: 1. OSA 2. LBBB 3. h/o C difficile 4. Rheumatic fever as a child with possible rheumatic heart disease and severe MR.  - Echo (4/18): EF 25-30%, mild RV dilation with mildly decreased RV systolic function, moderate TR, thickened MV leaflets with 2 MR jets (2 defined areas of malcoaptation), severe MR.   - TEE (4/18): EF 25-30%, mild LV dilation, confirms severe MR with the above features.  - TEE (8/18): EF 30-35%, septal-lateral dyssynchrony, moderate MR (likely functional).  - Echo (1/19): EF 55-60%, mild MR.  - Echo (8/22): EF 55-60%, mild MR.  5. Chronic systolic CHF: Nonischemic, prior myocarditis + valvular, versus all valvular.  - RHC/LHC (4/18): No significant CAD, severe MR.  Mean RA 18, PA 61/29, mean PCWP 30, CI 1.89.  - EF 30-35% on TEE 8/18.  - RHC (8/18): mean RA 1, PA 32/8, mean PCWP 6, CI 1.83 - [employer] CRT-D placed 8/18.  - Echo (2/19): EF 55-60%, mild MR 6. Anxiety/depression 7. Lung nodule by CT  8. Type 2 diabetes 9. GERD 10. Gout  SH: Married, lives in [place], retired Airline pilot, daughter lives in [place], nonsmoker, no ETOH.   FH: Mother with sudden cardiac death at 52.   Review of systems complete and found to be negative unless listed in HPI.   Current Outpatient Medications  Medication Sig Dispense Refill   acetaminophen (TYLENOL) 500 MG tablet Take 1,000 mg by mouth every 8 (eight) hours as needed for moderate pain.  albuterol (PROVENTIL HFA;VENTOLIN HFA) 108 (90 Base) MCG/ACT inhaler Inhale 2 puffs into the lungs every 6 (six) hours as needed for wheezing or shortness of breath.     allopurinol (ZYLOPRIM) 300 MG tablet Take 300 mg by mouth daily.     ALPRAZolam (XANAX) 0.25 MG tablet Take 0.125 mg by mouth 2 (two) times daily as needed for anxiety.     aspirin EC 81 MG tablet Take 81 mg by mouth every morning.       buPROPion (WELLBUTRIN XL) 150 MG 24 hr tablet Take 150 mg by mouth daily.     cholecalciferol (VITAMIN D) 1000 UNITS tablet Take 1,000 Units by mouth daily.     colchicine 0.6 MG tablet Take 0.6 mg by mouth daily as needed (Gout Flare).     Cyanocobalamin (VITAMIN B-12) 2500 MCG SUBL Place 2,500 mcg under the tongue daily.     cyclobenzaprine (FLEXERIL) 10 MG tablet Take 10 mg by mouth 3 (three) times daily as needed for muscle spasms.     ENTRESTO 24-26 MG TAKE 1 TABLET BY MOUTH 2 (TWO) TIMES DAILY. NEEDS FOLLOW UP APPOINTMENT FOR MORE REFILLS 60 tablet 0   escitalopram (LEXAPRO) 20 MG tablet Take 20 mg by mouth every morning.      fluticasone (FLONASE) 50 MCG/ACT nasal spray Place 2 sprays into both nostrils daily as needed for allergies or rhinitis.     fluticasone furoate-vilanterol (BREO ELLIPTA) 200-25 MCG/INH AEPB Inhale 1 puff into the lungs daily as needed (asthma).     furosemide (LASIX) 40 MG tablet Take 1 tablet (40 mg total) by mouth daily. 180 tablet 3   levalbuterol (XOPENEX) 0.63 MG/3ML nebulizer solution Take 0.63 mg by nebulization every 4 (four) hours as needed for wheezing or shortness of breath.     levocetirizine (XYZAL) 5 MG tablet Take 5 mg by mouth every evening.     meloxicam (MOBIC) 15 MG tablet Take 15 mg by mouth daily as needed for pain.     metoprolol succinate (TOPROL-XL) 25 MG 24 hr tablet Please call to schedule follow up [phone] TAKE 1 TABLET IN THE MORNING AND 2 TABLETS IN THE EVENING 270 tablet 1   MOUNJARO 7.5 MG/0.5ML Pen Inject 10 mg into the skin once a week. Takes on Sunday     omeprazole (PRILOSEC) 40 MG capsule Take 40 mg by mouth in the morning and at bedtime.     simvastatin (ZOCOR) 40 MG tablet Take 1 tablet (40 mg total) by mouth at bedtime. 90 tablet 3   spironolactone (ALDACTONE) 25 MG tablet Take 1 tablet (25 mg total) by mouth at bedtime. 90 tablet 0   No current facility-administered medications for this encounter.   BP 106/62   Pulse 78    Wt 110.8 kg (244 lb 3.2 oz)   LMP  (LMP Unknown)   SpO2 99%   BMI 35.04 kg/m    Wt Readings from Last 3 Encounters:  11/24/23 110.8 kg (244 lb 3.2 oz)  07/28/22 128.3 kg (282 lb 12.8 oz)  06/22/22 129.3 kg (285 lb)   Physical Exam General:  NAD. No resp difficulty, walked into clinic HEENT: Normal Neck: Supple. No JVD. Cor: Regular rate & rhythm. No rubs, gallops or murmurs. Lungs: Clear Abdomen: Soft, nontender, nondistended.  Extremities: No cyanosis, clubbing, rash, edema Neuro: Alert & oriented x 3, moves all 4 extremities w/o difficulty. Affect pleasant.  Assessment/Plan: 1. Chronic systolic CHF: EF 82-95% on echo in 4/18, nonischemic cardiomyopathy.  She had  severe mitral regurgitation and a rheumatic-appearing mitral valve.  It is hard to know which was the initial problem (cardiomyopathy or mitral regurgitation), and hard to know how related they are.  However, based on most recent echo, suspect that she has a nonischemic cardiomyopathy from some other etiology like myocarditis and has a co-existing rheumatic mitral valve with perhaps worsening of MR from annular dilatation (secondary MR). Followup TEE in 8/18 showed EF 30-35% with dyssynchrony but MR only moderate.  RHC showed low filling pressures.  Now s/p [employer] CRT-D.  Echo 2/19 and again in 8/22 with EF up to 55-60% and MR only mild. NYHA class I, not volume overloaded. Doing well. - Continue Lasix 40 mg daily. Labs today. - Continue spiro 25 mg daily. - Continue Entresto 24/26 mg bid. - Continue Toprol 25 mg am, 50 mg pm - Update echo. 2. Rheumatic heart disease: Severe MR on 4/18 TEE with a rheumatic-appearing valve, also moderate TR.  There were 2 jets of MR from 2 defined areas of malcoaptation.  As above, I suspect there was a co-existing nonischemic cardiomyopathy with a component of functional MR from annular dilatation.  Repeat TEE in 8/18 showed moderate MR, and echo 09/2017 and again in 8/23 with  normalization of EF showed only mild MR.  It appears that most of the MR was functional.  - update echo 3. LBBB: s/p CRT device placement.  - No change.  4. Obesity: Body mass index is 35.04 kg/m. - She is on Mounjaro, down 65 lbs! 5. Hyperlipidemia: Continue Zocor 40 mg daily. Check LFTs/lipids today.  - If LDL elevated, consider switch to high intensity statin. We dicussed this today and she is agreeable. Await lipids  Update echo. Follow up in 1 year with Dr. Kathreen Cornfield [facility] FNP-BC 11/24/2023

## 2023-11-24 ENCOUNTER — Encounter (HOSPITAL_COMMUNITY): Payer: Self-pay

## 2023-11-24 ENCOUNTER — Ambulatory Visit (HOSPITAL_COMMUNITY)
Admission: RE | Admit: 2023-11-24 | Discharge: 2023-11-24 | Disposition: A | Discharge status: Home / Self Care | Source: Ambulatory Visit | Attending: Family Medicine | Admitting: Family Medicine

## 2023-11-24 VITALS — BP 106/62 | HR 78 | Wt 244.2 lb

## 2023-11-24 DIAGNOSIS — Z6835 Body mass index (BMI) 35.0-35.9, adult: Secondary | ICD-10-CM | POA: Insufficient documentation

## 2023-11-24 DIAGNOSIS — E785 Hyperlipidemia, unspecified: Secondary | ICD-10-CM

## 2023-11-24 DIAGNOSIS — I099 Rheumatic heart disease, unspecified: Secondary | ICD-10-CM

## 2023-11-24 DIAGNOSIS — I428 Other cardiomyopathies: Secondary | ICD-10-CM | POA: Insufficient documentation

## 2023-11-24 DIAGNOSIS — Z79899 Other long term (current) drug therapy: Secondary | ICD-10-CM | POA: Insufficient documentation

## 2023-11-24 DIAGNOSIS — E669 Obesity, unspecified: Secondary | ICD-10-CM | POA: Diagnosis not present

## 2023-11-24 DIAGNOSIS — I5022 Chronic systolic (congestive) heart failure: Secondary | ICD-10-CM | POA: Diagnosis not present

## 2023-11-24 DIAGNOSIS — I429 Cardiomyopathy, unspecified: Secondary | ICD-10-CM | POA: Diagnosis present

## 2023-11-24 DIAGNOSIS — I051 Rheumatic mitral insufficiency: Secondary | ICD-10-CM | POA: Insufficient documentation

## 2023-11-24 DIAGNOSIS — R9431 Abnormal electrocardiogram [ECG] [EKG]: Secondary | ICD-10-CM | POA: Insufficient documentation

## 2023-11-24 DIAGNOSIS — I447 Left bundle-branch block, unspecified: Secondary | ICD-10-CM | POA: Diagnosis not present

## 2023-11-24 LAB — COMPREHENSIVE METABOLIC PANEL WITH GFR
ALT: 14 U/L (ref 0–44)
AST: 17 U/L (ref 15–41)
Albumin: 4.2 g/dL (ref 3.5–5.0)
Alkaline Phosphatase: 36 U/L — ABNORMAL LOW (ref 38–126)
Anion gap: 7 (ref 5–15)
BUN: 12 mg/dL (ref 8–23)
CO2: 27 mmol/L (ref 22–32)
Calcium: 9.6 mg/dL (ref 8.9–10.3)
Chloride: 110 mmol/L (ref 98–111)
Creatinine, Ser: 1.03 mg/dL — ABNORMAL HIGH (ref 0.44–1.00)
GFR, Estimated: 58 mL/min — ABNORMAL LOW (ref >60)
Glucose, Bld: 92 mg/dL (ref 70–99)
Potassium: 4.1 mmol/L (ref 3.5–5.1)
Sodium: 144 mmol/L (ref 135–145)
Total Bilirubin: 0.7 mg/dL (ref 0.0–1.2)
Total Protein: 7.1 g/dL (ref 6.5–8.1)

## 2023-11-24 LAB — LIPID PANEL
Cholesterol: 137 mg/dL (ref 0–200)
HDL: 38 mg/dL — ABNORMAL LOW (ref >40)
LDL Cholesterol: 83 mg/dL (ref 0–99)
Total CHOL/HDL Ratio: 3.6 ratio
Triglycerides: 82 mg/dL (ref <150)
VLDL: 16 mg/dL (ref 0–40)

## 2023-11-24 MED ORDER — METOPROLOL SUCCINATE ER 25 MG PO TB24: ORAL_TABLET | ORAL | 1 refills | Status: DC

## 2023-11-24 MED ORDER — SPIRONOLACTONE 25 MG PO TABS: 25.0000 mg | ORAL_TABLET | Freq: Every day | ORAL | 3 refills | Status: AC

## 2023-11-24 MED ORDER — SIMVASTATIN 40 MG PO TABS: 40.0000 mg | ORAL_TABLET | Freq: Every day | ORAL | 3 refills | Status: AC

## 2023-11-24 MED ORDER — FUROSEMIDE 40 MG PO TABS: 40.0000 mg | ORAL_TABLET | Freq: Every day | ORAL | 3 refills | Status: DC

## 2023-11-24 NOTE — Patient Instructions (Addendum)
 Good to see you today!  Your physician recommends that you schedule a follow-up appointment IN 1 YEAR- PLEASE CALL OUR OFFICE AROUND MARCH 2024 TO GET SCHEDULED FOR YOUR APPOINTMENT. PHONE NUMBER IS [phone] OPTION 2    Labs done today, your results will be available in MyChart, we will contact you for abnormal readings.  ECHO ORDERED AND SCHEDULED AT [facility] Arrive 9:45a Med Cleotis Lema   If you have any questions or concerns before your next appointment please send [place] a message through [place] or call our office at [phone].    TO LEAVE A MESSAGE FOR THE NURSE SELECT OPTION 2, PLEASE LEAVE A MESSAGE INCLUDING: YOUR NAME DATE OF BIRTH CALL BACK NUMBER REASON FOR CALL**this is important as we prioritize the call backs  YOU WILL RECEIVE A CALL BACK THE SAME DAY AS LONG AS YOU CALL BEFORE 4:00 PM At the [facility], you and your health needs are our priority. As part of our continuing mission to provide you with exceptional heart care, we have created designated Provider Care Teams. These Care Teams include your primary Cardiologist (physician) and Advanced Practice Providers (APPs- Physician Assistants and Nurse Practitioners) who all work together to provide you with the care you need, when you need it.   You may see any of the following providers on your designated Care Team at your next follow up: Dr Arvilla Meres Dr Marca Ancona Dr. Dorthula Nettles Dr. Clearnce Hasten Amy Filbert Schilder, NP Robbie Lis, [place] [facility] [place], [place] Brynda Peon, NP [place] Lee, NP Clarisa Kindred, NP Karle Plumber, PharmD Enos Fling, PharmD   Please be sure to bring in all your medications bottles to every appointment.    Thank you for choosing [facility]

## 2023-11-27 ENCOUNTER — Telehealth (HOSPITAL_COMMUNITY): Payer: Self-pay

## 2023-11-27 MED ORDER — METOPROLOL SUCCINATE ER 25 MG PO TB24: ORAL_TABLET | ORAL | 0 refills | Status: DC

## 2023-11-27 NOTE — Telephone Encounter (Signed)
 Received call from patient who states that she needs refill on her Metoprolol succinate 25mg  in the am and 50mg  in the PM. Reports she only needs short supply until prescription comes in. Advised her that this has been sent to patient preferred pharmacy. Advised patient to call back to office with any issues, questions, or concerns. Patient verbalized understanding.

## 2023-12-11 ENCOUNTER — Other Ambulatory Visit (HOSPITAL_COMMUNITY): Payer: Self-pay | Admitting: Cardiology

## 2023-12-12 NOTE — Addendum Note (Signed)
 Addended by: Lott Rouleau A on: 12/12/2023 12:06 PM   Modules accepted: Orders

## 2023-12-12 NOTE — Progress Notes (Signed)
 Remote ICD transmission.

## 2023-12-13 ENCOUNTER — Encounter: Payer: Self-pay | Admitting: Cardiology

## 2023-12-28 ENCOUNTER — Ambulatory Visit
Admission: RE | Admit: 2023-12-28 | Discharge: 2023-12-28 | Disposition: A | Discharge status: Home / Self Care | Source: Ambulatory Visit | Attending: Family Medicine | Admitting: Family Medicine

## 2023-12-28 DIAGNOSIS — E119 Type 2 diabetes mellitus without complications: Secondary | ICD-10-CM | POA: Diagnosis not present

## 2023-12-28 DIAGNOSIS — R011 Cardiac murmur, unspecified: Secondary | ICD-10-CM | POA: Diagnosis not present

## 2023-12-28 DIAGNOSIS — I099 Rheumatic heart disease, unspecified: Secondary | ICD-10-CM | POA: Diagnosis present

## 2023-12-28 DIAGNOSIS — I5022 Chronic systolic (congestive) heart failure: Secondary | ICD-10-CM

## 2023-12-28 DIAGNOSIS — I509 Heart failure, unspecified: Secondary | ICD-10-CM | POA: Diagnosis not present

## 2023-12-28 LAB — ECHOCARDIOGRAM COMPLETE
AR max vel: 2.39 cm2
AV Area VTI: 2.96 cm2
AV Area mean vel: 2.3 cm2
AV Mean grad: 3 mmHg
AV Peak grad: 4.8 mmHg
Ao pk vel: 1.09 m/s
Area-P 1/2: 7.59 cm2
S' Lateral: 2.8 cm

## 2023-12-28 NOTE — Progress Notes (Signed)
*  PRELIMINARY RESULTS* Echocardiogram 2D Echocardiogram has been performed.  Margaret Vargas 12/28/2023, 11:03 AM

## 2023-12-29 ENCOUNTER — Ambulatory Visit (HOSPITAL_COMMUNITY): Payer: Self-pay | Admitting: Family Medicine

## 2024-01-10 ENCOUNTER — Other Ambulatory Visit (HOSPITAL_COMMUNITY): Payer: Self-pay | Admitting: Cardiology

## 2024-01-23 ENCOUNTER — Ambulatory Visit (INDEPENDENT_AMBULATORY_CARE_PROVIDER_SITE_OTHER): Payer: Medicare HMO

## 2024-01-23 DIAGNOSIS — I447 Left bundle-branch block, unspecified: Secondary | ICD-10-CM

## 2024-01-24 LAB — CUP PACEART REMOTE DEVICE CHECK
Battery Remaining Longevity: 78 mo
Battery Remaining Percentage: 88 %
Brady Statistic RA Percent Paced: 0 %
Brady Statistic RV Percent Paced: 1 %
Date Time Interrogation Session: 20250610005100
HighPow Impedance: 83 Ohm
Implantable Lead Connection Status: 753985
Implantable Lead Connection Status: 753985
Implantable Lead Connection Status: 753985
Implantable Lead Implant Date: 20180823
Implantable Lead Implant Date: 20180823
Implantable Lead Implant Date: 20180823
Implantable Lead Location: 753858
Implantable Lead Location: 753859
Implantable Lead Location: 753860
Implantable Lead Model: 293
Implantable Lead Model: 4674
Implantable Lead Model: 7741
Implantable Lead Serial Number: 435016
Implantable Lead Serial Number: 609786
Implantable Lead Serial Number: 872654
Implantable Pulse Generator Implant Date: 20180823
Lead Channel Impedance Value: 409 Ohm
Lead Channel Impedance Value: 675 Ohm
Lead Channel Impedance Value: 723 Ohm
Lead Channel Setting Pacing Amplitude: 2 V
Lead Channel Setting Pacing Amplitude: 2.3 V
Lead Channel Setting Pacing Amplitude: 2.5 V
Lead Channel Setting Pacing Pulse Width: 0.4 ms
Lead Channel Setting Pacing Pulse Width: 0.8 ms
Lead Channel Setting Sensing Sensitivity: 0.5 mV
Lead Channel Setting Sensing Sensitivity: 1 mV
Pulse Gen Serial Number: 186593
Zone Setting Status: 755011

## 2024-01-28 ENCOUNTER — Ambulatory Visit: Payer: Self-pay | Admitting: Internal Medicine

## 2024-02-07 ENCOUNTER — Telehealth (HOSPITAL_COMMUNITY): Payer: Self-pay | Admitting: Cardiology

## 2024-02-07 NOTE — Telephone Encounter (Signed)
 Patient called to report 6lb weight gain in 6 days Reports she forgot her lasix  on vacation (lasix  40 daily). Compliant with all other meds  Denies SOB, LE edema, or CP  Reports she just feels puffy all over   Should pt restart lasix  at normal dose of 40 mg daily or double for a few days

## 2024-02-14 ENCOUNTER — Other Ambulatory Visit (HOSPITAL_COMMUNITY): Payer: Self-pay | Admitting: Cardiology

## 2024-02-15 NOTE — Telephone Encounter (Signed)
PT AWARE AND VOICED UNDERSTANDING  

## 2024-03-27 NOTE — Addendum Note (Signed)
 Addended by: VICCI SELLER A on: 03/27/2024 11:10 AM   Modules accepted: Orders

## 2024-03-27 NOTE — Progress Notes (Signed)
 Remote ICD transmission.

## 2024-04-23 ENCOUNTER — Ambulatory Visit

## 2024-04-23 DIAGNOSIS — I447 Left bundle-branch block, unspecified: Secondary | ICD-10-CM

## 2024-04-24 LAB — CUP PACEART REMOTE DEVICE CHECK
Battery Remaining Longevity: 72 mo
Battery Remaining Percentage: 85 %
Brady Statistic RA Percent Paced: 0 %
Brady Statistic RV Percent Paced: 1 %
Date Time Interrogation Session: 20250909011500
HighPow Impedance: 82 Ohm
Implantable Lead Connection Status: 753985
Implantable Lead Connection Status: 753985
Implantable Lead Connection Status: 753985
Implantable Lead Implant Date: 20180823
Implantable Lead Implant Date: 20180823
Implantable Lead Implant Date: 20180823
Implantable Lead Location: 753858
Implantable Lead Location: 753859
Implantable Lead Location: 753860
Implantable Lead Model: 293
Implantable Lead Model: 4674
Implantable Lead Model: 7741
Implantable Lead Serial Number: 435016
Implantable Lead Serial Number: 609786
Implantable Lead Serial Number: 872654
Implantable Pulse Generator Implant Date: 20180823
Lead Channel Impedance Value: 420 Ohm
Lead Channel Impedance Value: 633 Ohm
Lead Channel Impedance Value: 714 Ohm
Lead Channel Setting Pacing Amplitude: 2 V
Lead Channel Setting Pacing Amplitude: 2.3 V
Lead Channel Setting Pacing Amplitude: 2.5 V
Lead Channel Setting Pacing Pulse Width: 0.4 ms
Lead Channel Setting Pacing Pulse Width: 0.8 ms
Lead Channel Setting Sensing Sensitivity: 0.5 mV
Lead Channel Setting Sensing Sensitivity: 1 mV
Pulse Gen Serial Number: 186593
Zone Setting Status: 755011

## 2024-04-27 ENCOUNTER — Other Ambulatory Visit (HOSPITAL_COMMUNITY): Payer: Self-pay | Admitting: Family Medicine

## 2024-04-30 ENCOUNTER — Ambulatory Visit: Payer: Self-pay | Admitting: Internal Medicine

## 2024-05-03 NOTE — Progress Notes (Signed)
Remote ICD Transmission.

## 2024-06-20 ENCOUNTER — Encounter: Payer: Self-pay | Admitting: Cardiology

## 2024-06-20 ENCOUNTER — Ambulatory Visit: Attending: Pulmonary Disease | Admitting: Cardiology

## 2024-06-20 VITALS — BP 95/60 | HR 80 | Ht 70.0 in | Wt 235.0 lb

## 2024-06-20 DIAGNOSIS — I099 Rheumatic heart disease, unspecified: Secondary | ICD-10-CM | POA: Diagnosis not present

## 2024-06-20 DIAGNOSIS — I447 Left bundle-branch block, unspecified: Secondary | ICD-10-CM | POA: Diagnosis not present

## 2024-06-20 DIAGNOSIS — Z95810 Presence of automatic (implantable) cardiac defibrillator: Secondary | ICD-10-CM

## 2024-06-20 DIAGNOSIS — I5022 Chronic systolic (congestive) heart failure: Secondary | ICD-10-CM | POA: Diagnosis not present

## 2024-06-20 LAB — CUP PACEART INCLINIC DEVICE CHECK
Date Time Interrogation Session: 20251106094306
HighPow Impedance: 71 Ohm
HighPow Impedance: 90 Ohm
Implantable Lead Connection Status: 753985
Implantable Lead Connection Status: 753985
Implantable Lead Connection Status: 753985
Implantable Lead Implant Date: 20180823
Implantable Lead Implant Date: 20180823
Implantable Lead Implant Date: 20180823
Implantable Lead Location: 753858
Implantable Lead Location: 753859
Implantable Lead Location: 753860
Implantable Lead Model: 293
Implantable Lead Model: 4674
Implantable Lead Model: 7741
Implantable Lead Serial Number: 435016
Implantable Lead Serial Number: 609786
Implantable Lead Serial Number: 872654
Implantable Pulse Generator Implant Date: 20180823
Lead Channel Impedance Value: 438 Ohm
Lead Channel Impedance Value: 648 Ohm
Lead Channel Impedance Value: 730 Ohm
Lead Channel Pacing Threshold Amplitude: 0.8 V
Lead Channel Pacing Threshold Amplitude: 1 V
Lead Channel Pacing Threshold Amplitude: 1.6 V
Lead Channel Pacing Threshold Pulse Width: 0.4 ms
Lead Channel Pacing Threshold Pulse Width: 0.4 ms
Lead Channel Pacing Threshold Pulse Width: 0.8 ms
Lead Channel Sensing Intrinsic Amplitude: 13.8 mV
Lead Channel Sensing Intrinsic Amplitude: 13.9 mV
Lead Channel Sensing Intrinsic Amplitude: 5.2 mV
Lead Channel Setting Pacing Amplitude: 2 V
Lead Channel Setting Pacing Amplitude: 2.5 V
Lead Channel Setting Pacing Amplitude: 2.6 V
Lead Channel Setting Pacing Pulse Width: 0.4 ms
Lead Channel Setting Pacing Pulse Width: 0.8 ms
Lead Channel Setting Sensing Sensitivity: 0.5 mV
Lead Channel Setting Sensing Sensitivity: 1 mV
Pulse Gen Serial Number: 186593
Zone Setting Status: 755011

## 2024-06-20 MED ORDER — METOPROLOL SUCCINATE ER 25 MG PO TB24: 25.0000 mg | ORAL_TABLET | Freq: Two times a day (BID) | ORAL | Status: AC

## 2024-06-20 MED ORDER — SPIRONOLACTONE 25 MG PO TABS: 12.5000 mg | ORAL_TABLET | Freq: Every day | ORAL | Status: DC

## 2024-06-20 NOTE — Progress Notes (Signed)
  Electrophysiology Office Note:   ID:  Margaret Vargas, Margaret Vargas 1951/08/11, MRN [id number]  Primary Cardiologist: Ezra Shuck, MD Electrophysiologist: Danelle Birmingham, MD      History of Present Illness:   Margaret Vargas is a 72 y.o. female with h/o nonischemic cardiomyopathy, LBBB with septal dyssynchrony s/p CRT-D, severe MR seen today for routine electrophysiology followup.   Patient admitted in 2018 with acute HF. Inpatient imaging showed EF 25-30% with rheumatic appearing mitral valve and severe MR. Surgery saw her but with clinical improvement, intervention deferred. Later that year repeat echo with LVEF 30-35%, septal lateral dyssynchrony and moderate MR. [employer] CRT-D placed. Repeat echo 09/2017 with recovered LVEF 55-60%, mild MR. EF remained stable in 2022, 55-60%. Patient was seen in April of this year by HF clinic for yearly follow up. Was doing well at that time aside from symptoms with allergic rhinitis. Stable echo in May of this year, EF 55-60%.  Since last being seen in our clinic the patient reports doing well physically.  she denies chest pain, palpitations, dyspnea, PND, orthopnea, nausea, vomiting. She does report chronic dizziness, especially when getting up from a seated positioned or from her bed at night to use the bathroom. While this is not a new symptom, she does feel that this has worsened with weight loss over the last year.   While well physically, patient reports the recent and unexpected passing of her only sibling (brother) continues to be very difficult.   Review of systems complete and found to be negative unless listed in HPI.   EP Information / Studies Reviewed:    EKG is not ordered today. EKG from 11/24/23 reviewed which showed AS/VP rhythm, upright in lead I, R wave seen V1, QRS duration .       ICD Interrogation-  reviewed in detail today,  See PACEART report.  Arrhythmia/Device History HF MD--Dr Shuck ICD-[employer]-Latitude  NXT--[employer] (GORE lead)   Physical Exam:   VS:  LMP  (LMP Unknown)    Wt Readings from Last 3 Encounters:  11/24/23 244 lb 3.2 oz (110.8 kg)  07/28/22 282 lb 12.8 oz (128.3 kg)  06/22/22 285 lb (129.3 kg)     GEN: No acute distress. NECK: No JVD; No carotid bruits CARDIAC: Regular rate and rhythm, no murmurs, rubs, gallops RESPIRATORY:  Clear to auscultation without rales, wheezing or rhonchi  ABDOMEN: Soft, non-tender, non-distended EXTREMITIES:  No edema; No deformity   ASSESSMENT AND PLAN:    Chronic systolic CHF  s/p [employer] CRT-D  LBBB Euvolemic per device and physical exam. Normal ICD function. LV output increased to 2.6V (threshold slightly increased, 1.6V). See Elisabeth Art report.  Patient has Gore lead. Verified that shock polarity programmed initial and device set to max output for all shocks. Device arrhythmia log shows a few episodes of PMT. These are all brief and at least one appear to be AT. She is LV pacing 100%. April ECG as above with good morphology. Maintaining LVEF 55-60% since CRT-D implant.  Patient with borderline orthostatic vital signs, is chronically dizzy upon standing. I've asked her to follow up with HF team. Until she can see them, recommended decreasing Toprol  XL to 25mg  BID and cutting Spironolactone  to 12.5mg . With weight loss, suspect will need further GDMT adjustments.      Disposition:   Follow up with EP Team in 6 months. Discussed Dr. Adrian retirement, will plan for patient to transition to Dr. [place].   Signed, Artist Pouch, PA-C

## 2024-06-20 NOTE — Patient Instructions (Addendum)
 Medication Instructions:   START TAKING : TOPROL  XL  25 MG TWICE A DAY   START TAKING:  SPIRONOLACTONE  12.5 MG ONCE A DAY    *If you need a refill on your cardiac medications before your next appointment, please call your pharmacy*    Lab Work:  NONE ORDERED  TODAY    If you have labs (blood work) drawn today and your tests are completely normal, you will receive your results only by: MyChart Message (if you have MyChart) OR A paper copy in the mail If you have any lab test that is abnormal or we need to change your treatment, we will call you to review the results.   Testing/Procedures:    Follow-Up: At [facility], you and your health needs are our priority.  As part of our continuing mission to provide you with exceptional heart care, our providers are all part of one team.  This team includes your primary Cardiologist (physician) and Advanced Practice Providers or APPs (Physician Assistants and Nurse Practitioners) who all work together to provide you with the care you need, when you need it.  Your next appointment:   6 month(s)  Provider:  You may see  Charlies Arthur, PA-C Michael Andy Tillery, PA-C Brandi Ollis, NP Artist Pouch, PA-C   We recommend signing up for the patient portal called MyChart.  Sign up information is provided on this After Visit Summary.  MyChart is used to connect with patients for Virtual Visits (Telemedicine).  Patients are able to view lab/test results, encounter notes, upcoming appointments, etc.  Non-urgent messages can be sent to your provider as well.   To learn more about what you can do with MyChart, go to [email or web address].   Other Instructions

## 2024-06-21 ENCOUNTER — Encounter (HOSPITAL_COMMUNITY): Payer: Self-pay

## 2024-06-21 ENCOUNTER — Ambulatory Visit (HOSPITAL_COMMUNITY): Payer: Self-pay | Admitting: Physician Assistant

## 2024-06-21 ENCOUNTER — Ambulatory Visit (HOSPITAL_COMMUNITY)
Admission: RE | Admit: 2024-06-21 | Discharge: 2024-06-21 | Disposition: A | Discharge status: Home / Self Care | Source: Ambulatory Visit | Attending: Physician Assistant | Admitting: Physician Assistant

## 2024-06-21 VITALS — BP 98/70 | HR 77 | Ht 70.0 in | Wt 235.8 lb

## 2024-06-21 DIAGNOSIS — Z79899 Other long term (current) drug therapy: Secondary | ICD-10-CM | POA: Insufficient documentation

## 2024-06-21 DIAGNOSIS — Z6833 Body mass index (BMI) 33.0-33.9, adult: Secondary | ICD-10-CM | POA: Insufficient documentation

## 2024-06-21 DIAGNOSIS — I099 Rheumatic heart disease, unspecified: Secondary | ICD-10-CM

## 2024-06-21 DIAGNOSIS — I428 Other cardiomyopathies: Secondary | ICD-10-CM | POA: Insufficient documentation

## 2024-06-21 DIAGNOSIS — F32A Depression, unspecified: Secondary | ICD-10-CM | POA: Diagnosis not present

## 2024-06-21 DIAGNOSIS — I447 Left bundle-branch block, unspecified: Secondary | ICD-10-CM | POA: Diagnosis not present

## 2024-06-21 DIAGNOSIS — I081 Rheumatic disorders of both mitral and tricuspid valves: Secondary | ICD-10-CM | POA: Diagnosis not present

## 2024-06-21 DIAGNOSIS — E669 Obesity, unspecified: Secondary | ICD-10-CM | POA: Diagnosis not present

## 2024-06-21 DIAGNOSIS — E6609 Other obesity due to excess calories: Secondary | ICD-10-CM

## 2024-06-21 DIAGNOSIS — Z7985 Long-term (current) use of injectable non-insulin antidiabetic drugs: Secondary | ICD-10-CM | POA: Diagnosis not present

## 2024-06-21 DIAGNOSIS — I5022 Chronic systolic (congestive) heart failure: Secondary | ICD-10-CM | POA: Insufficient documentation

## 2024-06-21 DIAGNOSIS — E66811 Obesity, class 1: Secondary | ICD-10-CM

## 2024-06-21 DIAGNOSIS — E785 Hyperlipidemia, unspecified: Secondary | ICD-10-CM | POA: Diagnosis not present

## 2024-06-21 LAB — BASIC METABOLIC PANEL WITH GFR
Anion gap: 10 (ref 5–15)
BUN: 12 mg/dL (ref 8–23)
CO2: 26 mmol/L (ref 22–32)
Calcium: 9.2 mg/dL (ref 8.9–10.3)
Chloride: 106 mmol/L (ref 98–111)
Creatinine, Ser: 1.01 mg/dL — ABNORMAL HIGH (ref 0.44–1.00)
GFR, Estimated: 59 mL/min — ABNORMAL LOW (ref >60)
Glucose, Bld: 86 mg/dL (ref 70–99)
Potassium: 3.8 mmol/L (ref 3.5–5.1)
Sodium: 142 mmol/L (ref 135–145)

## 2024-06-21 LAB — BRAIN NATRIURETIC PEPTIDE: B Natriuretic Peptide: 61.4 pg/mL (ref 0.0–100.0)

## 2024-06-21 MED ORDER — FUROSEMIDE 40 MG PO TABS: 40.0000 mg | ORAL_TABLET | ORAL | 3 refills | Status: DC

## 2024-06-21 MED ORDER — SACUBITRIL-VALSARTAN 24-26 MG PO TABS: ORAL_TABLET | ORAL | 11 refills | Status: AC

## 2024-06-21 MED ORDER — ENTRESTO 24-26 MG PO TABS: 1.0000 | ORAL_TABLET | Freq: Two times a day (BID) | ORAL | 11 refills | Status: DC

## 2024-06-21 NOTE — Patient Instructions (Addendum)
 Decrease Metoprolol  25 mg twice daily. Keep Spiro at 25 mg daily. Decrease Entresto  to one half tablet of  24/26 mg twice daily. Decrease Lasix  to 3 x week. Labs today - will call you if abnormal. Return to Heart Failure APP Clinic in 4 weeks - see below. Please call us  at [phone] if any questions or concerns prior to your next appointment.

## 2024-06-21 NOTE — Progress Notes (Signed)
 Advanced Heart Failure Clinic Note  PCP: Dr. Verena HF Cardiology: Dr. Rolan Charlies [name] is a 73 y.o. female with a history of suspected rheumatic fever, nonischemic cardiomyopathy, and severe mitral regurgitation presents for followup of CHF and mitral regurgitation.  Patient had an episode in childhood that was thought to be rheumatic fever.  She had a murmur as a child, but never had an echocardiogram.  Starting last summer, she developed exertional dyspnea. This has been progressive since that time.  She never saw a cardiologist until she was admitted in 4/18 with worsening dyspnea and weight gain.  Weight increased about 35 lbs from last summer.   She was diuresed in the hospital and had an echo and a TEE, showing EF 25-30% with rheumatic-appearing mitral valve and severe mitral regurgitation.  She has been seen by Dr Quin for evaluation for surgical repair/replacement of the MV.  Given improvement in symptoms, he suggested repeating TEE eventually to reassess.  Therefore, she had repeat RHC and TEE in 8/18 after medical therapy.  EF was 30-35% with septal-lateral dyssynchrony and there was only moderate functional MR.  RHC showed low filling pressures.  She had [employer] CRT-D placed in 8/18.   Echo 09/2017 showed EF up to 55-60% with mild MR.   Echo in 8/22 showed EF 55-60% with mild MR.   Right breast lumpectomy in 11/23, fibrocystic changes noted.   Echo 05/25: EF 55-60%, RV okay  She is here today for concerns regarding dizziness and hypotension. She has lost over 70 lb the last 1-2 years. Over the last few months she's had worsening positional lightheadedness. She was recently in a hot shower and felt as if she may pass out, her husband was nearby and helped her to a seated position. She has not had any true syncope. Recently seen by EP, BP was soft and she was borderline orthostatic. She has not been eating as much with mounjaro and not drinking as much the last  couple of months. No shortness of breath, orthopnea, PND or lower extremity edema.  Dealing with some depression after losing her brother suddenly over the summer. Hasn't been as active, [facility] other [facility].   BoSCI interrogation from 11/03 (personally reviewed): HL index 1, no AT/AF, no ventricular arrhythmias   PMH: 1. OSA 2. LBBB 3. h/o C difficile 4. Rheumatic fever as a child with possible rheumatic heart disease and severe MR.  - Echo (4/18): EF 25-30%, mild RV dilation with mildly decreased RV systolic function, moderate TR, thickened MV leaflets with 2 MR jets (2 defined areas of malcoaptation), severe MR.   - TEE (4/18): EF 25-30%, mild LV dilation, confirms severe MR with the above features.  - TEE (8/18): EF 30-35%, septal-lateral dyssynchrony, moderate MR (likely functional).  - Echo (1/19): EF 55-60%, mild MR.  - Echo (8/22): EF 55-60%, mild MR.  - Echo (5/25): EF 55-60%, mild MR 5. Chronic systolic CHF: Nonischemic, prior myocarditis + valvular, versus all valvular.  - RHC/LHC (4/18): No significant CAD, severe MR.  Mean RA 18, PA 61/29, mean PCWP 30, CI 1.89.  - EF 30-35% on TEE 8/18.  - RHC (8/18): mean RA 1, PA 32/8, mean PCWP 6, CI 1.83 - [employer] CRT-D placed 8/18.  - Echo (2/19): EF 55-60%, mild MR - Echo (2/25): EF 55-60%, mild MR 6. Anxiety/depression 7. Lung nodule by CT  8. Type 2 diabetes 9. GERD 10. Gout  SH: Married, lives in [place], retired airline pilot, daughter lives in [place], nonsmoker, no ETOH.   FH: Mother with sudden cardiac death at 30.   Review of systems complete and found to be negative unless listed in HPI.   Current Outpatient Medications  Medication Sig Dispense Refill   acetaminophen  (TYLENOL ) 500 MG tablet Take 1,000 mg by mouth every 8 (eight) hours as needed for moderate pain.     albuterol  (PROVENTIL  HFA;VENTOLIN  HFA) 108 (90 Base) MCG/ACT inhaler Inhale 2 puffs into the  lungs every 6 (six) hours as needed for wheezing or shortness of breath.     allopurinol  (ZYLOPRIM ) 300 MG tablet Take 300 mg by mouth daily.     ALPRAZolam  (XANAX ) 0.25 MG tablet Take 0.125 mg by mouth 2 (two) times daily as needed for anxiety.     aspirin  EC 81 MG tablet Take 81 mg by mouth every morning.      buPROPion (WELLBUTRIN XL) 150 MG 24 hr tablet Take 150 mg by mouth daily.     cholecalciferol  (VITAMIN D ) 1000 UNITS tablet Take 1,000 Units by mouth daily.     colchicine  0.6 MG tablet Take 0.6 mg by mouth daily as needed (Gout Flare).     Cyanocobalamin  (VITAMIN B-12) 2500 MCG SUBL Place 2,500 mcg under the tongue daily.     cyclobenzaprine  (FLEXERIL ) 10 MG tablet Take 10 mg by mouth 3 (three) times daily as needed for muscle spasms.     escitalopram  (LEXAPRO ) 20 MG tablet Take 20 mg by mouth every morning.      fluticasone  (FLONASE ) 50 MCG/ACT nasal spray Place 2 sprays into both nostrils daily as needed for allergies or rhinitis.     fluticasone  furoate-vilanterol (BREO ELLIPTA ) 200-25 MCG/INH AEPB Inhale 1 puff into the lungs daily as needed (asthma).     levalbuterol (XOPENEX) 0.63 MG/3ML nebulizer solution Take 0.63 mg by nebulization every 4 (four) hours as needed for wheezing or shortness of breath.     levocetirizine (XYZAL) 5 MG tablet Take 5 mg by mouth every evening.     meloxicam  (MOBIC ) 15 MG tablet Take 15 mg by mouth daily as needed for pain.     metoprolol  succinate (TOPROL  XL) 25 MG 24 hr tablet Take 1 tablet (25 mg total) by mouth in the morning and at bedtime.     MOUNJARO 7.5 MG/0.5ML Pen Inject 10 mg into the skin once a week. Takes on Sunday     omeprazole (PRILOSEC) 40 MG capsule Take 40 mg by mouth in the morning and at bedtime.     simvastatin  (ZOCOR ) 40 MG tablet Take 1 tablet (40 mg total) by mouth at bedtime. 90 tablet 3   spironolactone  (ALDACTONE ) 25 MG tablet Take 1 tablet (25 mg total) by mouth at bedtime. 90 tablet 3   ENTRESTO  24-26 MG Take 1 tablet by  mouth 2 (two) times daily. 60 tablet 11   furosemide  (LASIX ) 40 MG tablet Take 1 tablet (40 mg total) by mouth 3 (three) times a week. 45 tablet 3   No current facility-administered medications for this encounter.   BP 98/70   Pulse 77   Ht 5' 10 (1.778 m)   Wt 107 kg (235 lb 12.8 oz)   LMP  (LMP Unknown)   SpO2 98%   BMI 33.83 kg/m    Wt Readings from Last 3 Encounters:  06/21/24 107 kg (235 lb 12.8 oz)  06/20/24 106.6 kg (235 lb)  11/24/23 110.8 kg (244 lb 3.2 oz)  Physical Exam General:  Well appearing. Ambulated into clinic. Cor: No JVD. Regular rate & rhythm. No murmurs. Lungs: clear Abdomen: soft, nontender, nondistended. Extremities: no edema Neuro: alert & orientedx3. Affect depressed.   Assessment/Plan: 1. Chronic systolic CHF: EF 74-69% on echo in 4/18, nonischemic cardiomyopathy.  She had severe mitral regurgitation and a rheumatic-appearing mitral valve.  It is hard to know which was the initial problem (cardiomyopathy or mitral regurgitation), and hard to know how related they are.  However, based on most recent echo, suspect that she has a nonischemic cardiomyopathy from some other etiology like myocarditis and has a co-existing rheumatic mitral valve with perhaps worsening of MR from annular dilatation (secondary MR). Followup TEE in 8/18 showed EF 30-35% with dyssynchrony but MR only moderate.  RHC showed low filling pressures.  Now s/p [employer] CRT-D.  Echo 2/19 and again in 8/22 with EF up to 55-60% and MR only mild. Echo 5/25 with stable EF 55-60%. MR mild. NYHA I-II. Volume low on exam and by device.  - Will need to scale back diuretics and GDMT d/t episodes of hypotension and dizziness in setting of recent weight loss - Decrease lasix  to 40 mg three times a week. Labs today - Decrease entresto  to 12/13 mg BID (she wants to avoid stopping if at all possible).  - Continue spiro 25 mg daily. - Decrease metoprolol  succinate to 25 mg BID - She will call  if symptoms are not improving. Recommended she monitor blood pressure at home, especially if symptomatic. 2. Rheumatic heart disease: Severe MR on 4/18 TEE with a rheumatic-appearing valve, also moderate TR.  There were 2 jets of MR from 2 defined areas of malcoaptation.  As above, I suspect there was a co-existing nonischemic cardiomyopathy with a component of functional MR from annular dilatation.  Repeat TEE in 8/18 showed moderate MR, and echo 09/2017 and again in 8/23 with normalization of EF showed only mild MR. Mild MR again noted on echo 5/25.  It appears that most of the MR was functional.  3. LBBB: s/p CRT device placement.  - No change.  4. Obesity: Body mass index is 33.83 kg/m. - She is on Mounjaro, down over 70 lb! - She was congratulated on weight loss 5. Hyperlipidemia: Continue Zocor  40 mg daily.  - LDL 83 in 4/25 6. Depression: Worse after sudden loss of her brother - Encouraged her to reengage in activities she previously enjoyed  Follow-up: 4 weeks with APP, if not improving may need to further scale back GDMT  [facility], Nik Gorrell N PA-C 06/21/2024

## 2024-07-04 ENCOUNTER — Encounter: Payer: Self-pay | Admitting: Internal Medicine

## 2024-07-19 ENCOUNTER — Ambulatory Visit (HOSPITAL_COMMUNITY)

## 2024-07-23 ENCOUNTER — Ambulatory Visit (HOSPITAL_COMMUNITY)

## 2024-07-23 ENCOUNTER — Ambulatory Visit

## 2024-07-24 LAB — CUP PACEART REMOTE DEVICE CHECK
Battery Remaining Longevity: 66 mo
Battery Remaining Percentage: 77 %
Brady Statistic RA Percent Paced: 0 %
Brady Statistic RV Percent Paced: 1 %
Date Time Interrogation Session: 20251209031400
HighPow Impedance: 83 Ohm
Implantable Lead Connection Status: 753985
Implantable Lead Connection Status: 753985
Implantable Lead Connection Status: 753985
Implantable Lead Implant Date: 20180823
Implantable Lead Implant Date: 20180823
Implantable Lead Implant Date: 20180823
Implantable Lead Location: 753858
Implantable Lead Location: 753859
Implantable Lead Location: 753860
Implantable Lead Model: 293
Implantable Lead Model: 4674
Implantable Lead Model: 7741
Implantable Lead Serial Number: 435016
Implantable Lead Serial Number: 609786
Implantable Lead Serial Number: 872654
Implantable Pulse Generator Implant Date: 20180823
Lead Channel Impedance Value: 416 Ohm
Lead Channel Impedance Value: 610 Ohm
Lead Channel Impedance Value: 702 Ohm
Lead Channel Setting Pacing Amplitude: 2 V
Lead Channel Setting Pacing Amplitude: 2.5 V
Lead Channel Setting Pacing Amplitude: 2.6 V
Lead Channel Setting Pacing Pulse Width: 0.4 ms
Lead Channel Setting Pacing Pulse Width: 0.8 ms
Lead Channel Setting Sensing Sensitivity: 0.5 mV
Lead Channel Setting Sensing Sensitivity: 1 mV
Pulse Gen Serial Number: 186593
Zone Setting Status: 755011

## 2024-07-25 ENCOUNTER — Ambulatory Visit: Payer: Self-pay | Admitting: Internal Medicine

## 2024-07-31 NOTE — Progress Notes (Signed)
 Remote ICD Transmission

## 2024-08-06 ENCOUNTER — Telehealth (HOSPITAL_COMMUNITY): Payer: Self-pay

## 2024-08-06 NOTE — Telephone Encounter (Signed)
 Called to confirm/remind patient of their appointment at the Advanced Heart Failure Clinic on 08/07/24.   Appointment:   [x] Confirmed  [] Left mess   [] No answer/No voice mail  [] VM Full/unable to leave message  [] Phone not in service  Patient reminded to bring all medications and/or complete list.  Confirmed patient has transportation. Gave directions, instructed to utilize valet parking.

## 2024-08-07 ENCOUNTER — Encounter (HOSPITAL_COMMUNITY): Payer: Self-pay

## 2024-08-07 ENCOUNTER — Ambulatory Visit (HOSPITAL_COMMUNITY)
Admission: RE | Admit: 2024-08-07 | Discharge: 2024-08-07 | Disposition: A | Discharge status: Home / Self Care | Source: Ambulatory Visit | Attending: Adult Health | Admitting: Adult Health

## 2024-08-07 VITALS — BP 122/74 | HR 79 | Ht 69.0 in | Wt 242.3 lb

## 2024-08-07 DIAGNOSIS — Z95810 Presence of automatic (implantable) cardiac defibrillator: Secondary | ICD-10-CM | POA: Insufficient documentation

## 2024-08-07 DIAGNOSIS — Z6835 Body mass index (BMI) 35.0-35.9, adult: Secondary | ICD-10-CM | POA: Insufficient documentation

## 2024-08-07 DIAGNOSIS — F32A Depression, unspecified: Secondary | ICD-10-CM | POA: Diagnosis not present

## 2024-08-07 DIAGNOSIS — I099 Rheumatic heart disease, unspecified: Secondary | ICD-10-CM | POA: Insufficient documentation

## 2024-08-07 DIAGNOSIS — E669 Obesity, unspecified: Secondary | ICD-10-CM | POA: Insufficient documentation

## 2024-08-07 DIAGNOSIS — I081 Rheumatic disorders of both mitral and tricuspid valves: Secondary | ICD-10-CM | POA: Insufficient documentation

## 2024-08-07 DIAGNOSIS — Z79899 Other long term (current) drug therapy: Secondary | ICD-10-CM | POA: Diagnosis not present

## 2024-08-07 DIAGNOSIS — E785 Hyperlipidemia, unspecified: Secondary | ICD-10-CM | POA: Diagnosis not present

## 2024-08-07 DIAGNOSIS — Z7985 Long-term (current) use of injectable non-insulin antidiabetic drugs: Secondary | ICD-10-CM | POA: Insufficient documentation

## 2024-08-07 DIAGNOSIS — I428 Other cardiomyopathies: Secondary | ICD-10-CM | POA: Insufficient documentation

## 2024-08-07 DIAGNOSIS — I447 Left bundle-branch block, unspecified: Secondary | ICD-10-CM | POA: Insufficient documentation

## 2024-08-07 DIAGNOSIS — I5022 Chronic systolic (congestive) heart failure: Secondary | ICD-10-CM | POA: Insufficient documentation

## 2024-08-07 MED ORDER — FUROSEMIDE 40 MG PO TABS: 40.0000 mg | ORAL_TABLET | ORAL | 3 refills | Status: AC

## 2024-08-07 NOTE — Progress Notes (Signed)
 "  Advanced Heart Failure Clinic Note  PCP: Dr. Verena HF Cardiology: Dr. Rolan Charlies [name] Vargas is a 73 y.o. female with a history of suspected rheumatic fever, nonischemic cardiomyopathy, and severe mitral regurgitation presents for followup of CHF and mitral regurgitation.  Patient had an episode in childhood that was thought to be rheumatic fever.  She had a murmur as a child, but never had an echocardiogram.  Starting last summer, she developed exertional dyspnea. This has been progressive since that time.  She never saw a cardiologist until she was admitted in 4/18 with worsening dyspnea and weight gain.  Weight increased about 35 lbs from last summer.   She was diuresed in the hospital and had an echo and a TEE, showing EF 25-30% with rheumatic-appearing mitral valve and severe mitral regurgitation.  She has been seen by Dr Quin for evaluation for surgical repair/replacement of the MV.  Given improvement in symptoms, he suggested repeating TEE eventually to reassess.  Therefore, she had repeat RHC and TEE in 8/18 after medical therapy.  EF was 30-35% with septal-lateral dyssynchrony and there was only moderate functional MR.  RHC showed low filling pressures.  She had [employer] CRT-D placed in 8/18.   Echo 09/2017 showed EF up to 55-60% with mild MR.   Echo in 8/22 showed EF 55-60% with mild MR.   Right breast lumpectomy in 11/23, fibrocystic changes noted.   Echo 05/25: EF 55-60%, RV okay  Today she returns for HF follow up. Last visit diuretics and bb cut back due to hypotension and dizziness. Dizziness 95% better. Overall feeling fine. Continues to deal with the loss of brother. Denies SOB/PND/Orthopnea. Appetite ok. No fever or chills.  Taking all medications.   BoSCI interrogation. No VT No  AFib  Activity 1.6 hours.   PMH: 1. OSA 2. LBBB 3. h/o C difficile 4. Rheumatic fever as a child with possible rheumatic heart disease and severe MR.  - Echo (4/18): EF  25-30%, mild RV dilation with mildly decreased RV systolic function, moderate TR, thickened MV leaflets with 2 MR jets (2 defined areas of malcoaptation), severe MR.   - TEE (4/18): EF 25-30%, mild LV dilation, confirms severe MR with the above features.  - TEE (8/18): EF 30-35%, septal-lateral dyssynchrony, moderate MR (likely functional).  - Echo (1/19): EF 55-60%, mild MR.  - Echo (8/22): EF 55-60%, mild MR.  - Echo (5/25): EF 55-60%, mild MR 5. Chronic systolic CHF: Nonischemic, prior myocarditis + valvular, versus all valvular.  - RHC/LHC (4/18): No significant CAD, severe MR.  Mean RA 18, PA 61/29, mean PCWP 30, CI 1.89.  - EF 30-35% on TEE 8/18.  - RHC (8/18): mean RA 1, PA 32/8, mean PCWP 6, CI 1.83 - [employer] CRT-D placed 8/18.  - Echo (2/19): EF 55-60%, mild MR - Echo (2/25): EF 55-60%, mild MR 6. Anxiety/depression 7. Lung nodule by CT  8. Type 2 diabetes 9. GERD 10. 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   FH: Mother with sudden cardiac death at 37.   Review of systems complete and found to be negative unless listed in HPI.   Current Outpatient Medications  Medication Sig Dispense Refill   acetaminophen  (TYLENOL ) 500 MG tablet Take 1,000 mg by mouth every 8 (eight) hours as needed for moderate pain.     albuterol  (PROVENTIL  HFA;VENTOLIN  HFA) 108 (90 Base) MCG/ACT inhaler Inhale 2 puffs into the lungs  every 6 (six) hours as needed for wheezing or shortness of breath.     allopurinol  (ZYLOPRIM ) 300 MG tablet Take 300 mg by mouth daily.     ALPRAZolam  (XANAX ) 0.25 MG tablet Take 0.125 mg by mouth 2 (two) times daily as needed for anxiety.     aspirin  EC 81 MG tablet Take 81 mg by mouth every morning.      buPROPion (WELLBUTRIN XL) 150 MG 24 hr tablet Take 150 mg by mouth daily.     cholecalciferol  (VITAMIN D ) 1000 UNITS tablet Take 1,000 Units by mouth daily.     colchicine  0.6 MG tablet Take 0.6 mg  by mouth daily as needed (Gout Flare).     Cyanocobalamin  (VITAMIN B-12) 2500 MCG SUBL Place 2,500 mcg under the tongue daily.     cyclobenzaprine  (FLEXERIL ) 10 MG tablet Take 10 mg by mouth 3 (three) times daily as needed for muscle spasms.     escitalopram  (LEXAPRO ) 20 MG tablet Take 20 mg by mouth every morning.      fluticasone  (FLONASE ) 50 MCG/ACT nasal spray Place 2 sprays into both nostrils daily as needed for allergies or rhinitis.     fluticasone  furoate-vilanterol (BREO ELLIPTA ) 200-25 MCG/INH AEPB Inhale 1 puff into the lungs daily as needed (asthma).     furosemide  (LASIX ) 40 MG tablet Take 1 tablet (40 mg total) by mouth 3 (three) times a week. 45 tablet 3   levalbuterol (XOPENEX) 0.63 MG/3ML nebulizer solution Take 0.63 mg by nebulization every 4 (four) hours as needed for wheezing or shortness of breath.     levocetirizine (XYZAL) 5 MG tablet Take 5 mg by mouth every evening.     meloxicam  (MOBIC ) 15 MG tablet Take 15 mg by mouth daily as needed for pain.     metoprolol  succinate (TOPROL  XL) 25 MG 24 hr tablet Take 1 tablet (25 mg total) by mouth in the morning and at bedtime.     MOUNJARO 7.5 MG/0.5ML Pen Inject 10 mg into the skin once a week. Takes on Sunday     omeprazole (PRILOSEC) 40 MG capsule Take 40 mg by mouth in the morning and at bedtime.     sacubitril -valsartan  (ENTRESTO ) 24-26 MG Take 1/2 tablet twice daily 60 tablet 11   simvastatin  (ZOCOR ) 40 MG tablet Take 1 tablet (40 mg total) by mouth at bedtime. 90 tablet 3   spironolactone  (ALDACTONE ) 25 MG tablet Take 1 tablet (25 mg total) by mouth at bedtime. 90 tablet 3   No current facility-administered medications for this visit.   LMP  (LMP Unknown)    Wt Readings from Last 3 Encounters:  06/21/24 107 kg (235 lb 12.8 oz)  06/20/24 106.6 kg (235 lb)  11/24/23 110.8 kg (244 lb 3.2 oz)   General:   No resp difficulty Neck: no JVD.  Cor: Regular rate & rhythm.  Lungs: clear Abdomen: soft, nontender, nondistended.   Extremities: no  edema Neuro: alert & oriented x3   Assessment/Plan: 1. Chronic systolic CHF: EF 74-69% on echo in 4/18, nonischemic cardiomyopathy.  She had severe mitral regurgitation and a rheumatic-appearing mitral valve.  It is hard to know which was the initial problem (cardiomyopathy or mitral regurgitation), and hard to know how related they are.  However, based on most recent echo, suspect that she has a nonischemic cardiomyopathy from some other etiology like myocarditis and has a co-existing rheumatic mitral valve with perhaps worsening of MR from annular dilatation (secondary MR). Followup TEE in 8/18 showed  EF 30-35% with dyssynchrony but MR only moderate.  RHC showed low filling pressures.  Now s/p [employer] CRT-D.  Echo 2/19 and again in 8/22 with EF up to 55-60% and MR only mild. Echo 5/25 with stable EF 55-60%. MR mild.  NYHA II.  - Appear euvolemic. Cut back to 40 mg Mon and Friday.  - Continue entresto  to 12/13 mg BID (she wants to avoid stopping if at all possible).  - Continue spiro 25 mg daily. - Continue metoprolol  succinate to 25 mg BID 2. Rheumatic heart disease: Severe MR on 4/18 TEE with a rheumatic-appearing valve, also moderate TR.  There were 2 jets of MR from 2 defined areas of malcoaptation.  As above, I suspect there was a co-existing nonischemic cardiomyopathy with a component of functional MR from annular dilatation.  Repeat TEE in 8/18 showed moderate MR, and echo 09/2017 and again in 8/23 with normalization of EF showed only mild MR. Mild MR again noted on echo 5/25.  It appears that most of the MR was functional.  3. LBBB: s/p CRT device placement.  - No change.  4. Obesity: Body mass index is 35.78 kg/m. - She is on Mounjaro, contnue. Minimal side effects.  5. Hyperlipidemia: Continue Zocor  40 mg daily.  - LDL 83 in 4/25 6. Depression: She is being follow by therapist.   Follow-up: 6 months with Dr Rolan Greig Mosses NP-C  08/07/2024 "

## 2024-08-07 NOTE — Patient Instructions (Signed)
 Medication Changes:  DECREASE FUROSEMIDE  TO 40MG  ON MONDAY AND FRIDAY'S  Follow-Up in: 6 MONTHS WITH DR. ROLAN PLEASE CALL OUR OFFICE AROUND APRIL 2026 TO GET SCHEDULED FOR YOUR APPOINTMENT. PHONE NUMBER IS [phone] OPTION 2   At [facility], you and your health needs are our priority. We have a designated team specialized in the treatment of Heart Failure. This Care Team includes your primary Heart Failure Specialized Cardiologist (physician), Advanced Practice Providers (APPs- Physician Assistants and Nurse Practitioners), and Pharmacist who all work together to provide you with the care you need, when you need it.   You may see any of the following providers on your designated Care Team at your next follow up:  Dr. Toribio Fuel Dr. Ezra Rolan Dr. Odis Brownie Greig Mosses, NP Caffie Shed, [place] [facility] [place], [place] Beckey Coe, NP Jordan Lee, NP Tinnie Redman, PharmD   Please be sure to bring in all your medications bottles to every appointment.   Need to Contact Us :  If you have any questions or concerns before your next appointment please send us  a message through [place] [place] or call our office at [phone].    TO LEAVE A MESSAGE FOR THE NURSE SELECT OPTION 2, PLEASE LEAVE A MESSAGE INCLUDING: YOUR NAME DATE OF BIRTH CALL BACK NUMBER REASON FOR CALL**this is important as we prioritize the call backs  YOU WILL RECEIVE A CALL BACK THE SAME DAY AS LONG AS YOU CALL BEFORE 4:00 PM

## 2024-10-22 ENCOUNTER — Encounter

## 2025-01-21 ENCOUNTER — Encounter

## 2025-04-22 ENCOUNTER — Encounter
